# Patient Record
Sex: Female | Born: 1965 | Race: Black or African American | Hispanic: No | Smoking: Current every day smoker
Health system: Southern US, Community
[De-identification: ages and names within clinical notes are randomized; demographics above are authoritative.]

## PROBLEM LIST (undated history)

## (undated) ENCOUNTER — Emergency Department (HOSPITAL_COMMUNITY): Admission: EM | Payer: Medicaid Other | Source: Home / Self Care

## (undated) DIAGNOSIS — K219 Gastro-esophageal reflux disease without esophagitis: Secondary | ICD-10-CM

## (undated) DIAGNOSIS — F419 Anxiety disorder, unspecified: Secondary | ICD-10-CM

## (undated) DIAGNOSIS — G4730 Sleep apnea, unspecified: Secondary | ICD-10-CM

## (undated) DIAGNOSIS — R7303 Prediabetes: Secondary | ICD-10-CM

## (undated) DIAGNOSIS — M1990 Unspecified osteoarthritis, unspecified site: Secondary | ICD-10-CM

## (undated) DIAGNOSIS — I471 Supraventricular tachycardia, unspecified: Secondary | ICD-10-CM

## (undated) DIAGNOSIS — I10 Essential (primary) hypertension: Secondary | ICD-10-CM

## (undated) DIAGNOSIS — F329 Major depressive disorder, single episode, unspecified: Secondary | ICD-10-CM

## (undated) DIAGNOSIS — F32A Depression, unspecified: Secondary | ICD-10-CM

## (undated) HISTORY — DX: Gastro-esophageal reflux disease without esophagitis: K21.9

## (undated) HISTORY — DX: Essential (primary) hypertension: I10

## (undated) HISTORY — PX: HAND SURGERY: SHX662

## (undated) HISTORY — DX: Prediabetes: R73.03

## (undated) HISTORY — PX: COLONOSCOPY: SHX174

## (undated) HISTORY — PX: BACK SURGERY: SHX140

## (undated) HISTORY — DX: Sleep apnea, unspecified: G47.30

## (undated) HISTORY — DX: Anxiety disorder, unspecified: F41.9

## (undated) HISTORY — DX: Unspecified osteoarthritis, unspecified site: M19.90

## (undated) HISTORY — PX: ABDOMINAL HYSTERECTOMY: SHX81

## (undated) HISTORY — PX: TOTAL ABDOMINAL HYSTERECTOMY: SHX209

---

## 2000-10-21 HISTORY — PX: NECK SURGERY: SHX720

## 2016-06-16 ENCOUNTER — Emergency Department (HOSPITAL_COMMUNITY)
Admission: EM | Admit: 2016-06-16 | Discharge: 2016-06-16 | Disposition: A | Discharge status: Home / Self Care | Payer: Self-pay | Attending: Emergency Medicine | Admitting: Emergency Medicine

## 2016-06-16 ENCOUNTER — Encounter (HOSPITAL_COMMUNITY): Payer: Self-pay | Admitting: Emergency Medicine

## 2016-06-16 ENCOUNTER — Emergency Department (HOSPITAL_COMMUNITY): Payer: Self-pay

## 2016-06-16 DIAGNOSIS — R0789 Other chest pain: Secondary | ICD-10-CM | POA: Insufficient documentation

## 2016-06-16 DIAGNOSIS — F17210 Nicotine dependence, cigarettes, uncomplicated: Secondary | ICD-10-CM | POA: Insufficient documentation

## 2016-06-16 HISTORY — DX: Major depressive disorder, single episode, unspecified: F32.9

## 2016-06-16 HISTORY — DX: Depression, unspecified: F32.A

## 2016-06-16 LAB — BASIC METABOLIC PANEL
ANION GAP: 7 (ref 5–15)
BUN: 7 mg/dL (ref 6–20)
CHLORIDE: 107 mmol/L (ref 101–111)
CO2: 24 mmol/L (ref 22–32)
CREATININE: 0.67 mg/dL (ref 0.44–1.00)
Calcium: 9.6 mg/dL (ref 8.9–10.3)
GFR calc non Af Amer: >60 mL/min (ref >60)
Glucose, Bld: 96 mg/dL (ref 65–99)
POTASSIUM: 3.8 mmol/L (ref 3.5–5.1)
SODIUM: 138 mmol/L (ref 135–145)

## 2016-06-16 LAB — CBC
HEMATOCRIT: 44.1 % (ref 36.0–46.0)
HEMOGLOBIN: 15 g/dL (ref 12.0–15.0)
MCH: 30.7 pg (ref 26.0–34.0)
MCHC: 34 g/dL (ref 30.0–36.0)
MCV: 90.2 fL (ref 78.0–100.0)
PLATELETS: 278 10*3/uL (ref 150–400)
RBC: 4.89 MIL/uL (ref 3.87–5.11)
RDW: 13.1 % (ref 11.5–15.5)
WBC: 7.6 10*3/uL (ref 4.0–10.5)

## 2016-06-16 LAB — I-STAT TROPONIN, ED
TROPONIN I, POC: 0 ng/mL (ref 0.00–0.08)
Troponin i, poc: 0.01 ng/mL (ref 0.00–0.08)

## 2016-06-16 NOTE — ED Triage Notes (Signed)
Pt states for the last 3 days she has had central chest tightness with palpitations that have been intermittent. Pt states worse when she wakes up if she has to take her CPAP off at night. Pt states at this time just feels like "she has a chest cold".

## 2016-06-16 NOTE — Discharge Instructions (Signed)
You were seen in the ER today for evaluation of chest pain. Your labs were normal. Remember to use your CPAP machine as directed. Please follow up with your primary care provider as soon as possible. If you need a new one please call the phone number listed in this packet for assistance in establishing primary care.

## 2016-06-16 NOTE — ED Provider Notes (Signed)
[facility] DEPT Provider Note   CSN: [id number] Arrival date & time: 06/16/16  1443  History   Chief Complaint Chief Complaint  Patient presents with  . Palpitations  . Chest Pain   HPI  Lauren [name] is an 50 y.o. female with history of OSA who presents to the ED for evaluation of constant chest tightness and palpitations for the past three days. She states she thinks it is because she does not use her CPAP as she is supposed to. She states she has sleep apnea but will frequently take her CPAP off in the middle of the night because it is uncomfortable. She states she notices her chest tightness is the worst in the morning after she has slept without her CPAP. Denies cough or congestion. Denies shortness of breath. Denies diaphoresis. Denies sharp or pleuritic chest pain. Her tightness is described as "tight then thumping while I try to take a deep breath." it is not worse with exertion. Using her CPAP helps her feel better. Denies leg pain or swelling. Denies recent travel. Denies significant family cardiac history. She does smoke 1/2 PPD.  Past Medical History:  Diagnosis Date  . Depression     There are no active problems to display for this patient.   Past Surgical History:  Procedure Laterality Date  . ABDOMINAL HYSTERECTOMY    . BACK SURGERY    . CESAREAN SECTION    . HAND SURGERY      OB History    No data available       Home Medications    Prior to Admission medications   Medication Sig Start Date End Date Taking? Authorizing Provider  venlafaxine XR (EFFEXOR-XR) 150 MG 24 hr capsule Take 150 mg by mouth every morning. 05/13/16  Yes Historical Provider, MD    Family History No family history on file.  Social History Social History  Substance Use Topics  . Smoking status: Current Every Day Smoker    Packs/day: 0.50    Types: Cigarettes  . Smokeless tobacco: Never Used  . Alcohol use No     Allergies   Review of patient's allergies indicates no  known allergies.   Review of Systems Review of Systems 10 Systems reviewed and are negative for acute change except as noted in the HPI.   Physical Exam Updated Vital Signs BP 119/87 (BP Location: Right Arm)   Pulse 61   Temp 98.7 F (37.1 C) (Oral)   Resp 16   Ht 5\' 5"  (1.651 m)   Wt 68 kg   SpO2 100%   BMI 24.96 kg/m   Physical Exam  Constitutional: She is oriented to person, place, and time.  HENT:  Right Ear: External ear normal.  Left Ear: External ear normal.  Nose: Nose normal.  Mouth/Throat: Oropharynx is clear and moist. No oropharyngeal exudate.  Eyes: Conjunctivae are normal.  Neck: Neck supple.  Cardiovascular: Normal rate, regular rhythm, normal heart sounds and intact distal pulses.   Pulmonary/Chest: Effort normal and breath sounds normal. No respiratory distress. She has no wheezes.  Abdominal: Soft. Bowel sounds are normal. She exhibits no distension. There is no tenderness. There is no rebound and no guarding.  Musculoskeletal: She exhibits no edema.  Lymphadenopathy:    She has no cervical adenopathy.  Neurological: She is alert and oriented to person, place, and time. No cranial nerve deficit.  Skin: Skin is warm and dry.  Psychiatric: She has a normal mood and affect.  Nursing note and vitals reviewed.  ED Treatments / Results  Labs (all labs ordered are listed, but only abnormal results are displayed) Labs Reviewed  BASIC METABOLIC PANEL  CBC  I-STAT [place], ED  I-STAT [place], ED    EKG  EKG Interpretation  Date/Time:  Sunday June 16 2016 14:52:50 EDT Ventricular Rate:  66 PR Interval:  142 QRS Duration: 80 QT Interval:  408 QTC Calculation: 427 R Axis:   52 Text Interpretation:  Normal sinus rhythm with sinus arrhythmia Normal ECG No previous tracing Confirmed by KNOTT MD, DANIEL [phone]) on 06/17/2016 11:01:00 AM       Radiology Dg Chest 2 View  Result Date: 06/16/2016 CLINICAL DATA:  Short of breath and nausea  EXAM: CHEST  2 VIEW COMPARISON:  None. FINDINGS: Anterior cervical fusion. Normal mediastinum and cardiac silhouette. Normal pulmonary vasculature. No evidence of effusion, infiltrate, or pneumothorax. No acute bony abnormality. IMPRESSION: No acute cardiopulmonary process. Electronically Signed   By: Suzy Bouchard M.D.   On: 06/16/2016 15:39    Procedures Procedures (including critical care time)  Medications Ordered in ED Medications - No data to display   Initial Impression / Assessment and Plan / ED Course  I have reviewed the triage vital signs and the nursing notes.  Pertinent labs & imaging results that were available during my care of the patient were reviewed by me and considered in my medical decision making (see chart for details).  Clinical Course   Wells 0. Had ordered d-dimer but can't find tube. Pt doesn't want to wait for redraw. Understands risks of leaving without complete evaluation however I feel is reasonable due to low suspicion for PE. Doubt ACS. Delta troponin 0. Encouraged using CPAP as prescribed. Encouraged close PCP f/u. ER return precautions given.   Final Clinical Impressions(s) / ED Diagnoses   Final diagnoses:  Chest tightness    New Prescriptions Discharge Medication List as of 06/16/2016  7:29 PM       Anne Ng, PA-C 06/17/16 1439    Dorie Rank, MD 06/18/16 1106

## 2016-09-22 ENCOUNTER — Emergency Department (HOSPITAL_COMMUNITY)
Admission: EM | Admit: 2016-09-22 | Discharge: 2016-09-22 | Disposition: A | Discharge status: Home / Self Care | Payer: Self-pay | Attending: Emergency Medicine | Admitting: Emergency Medicine

## 2016-09-22 ENCOUNTER — Emergency Department (HOSPITAL_COMMUNITY): Payer: Self-pay

## 2016-09-22 ENCOUNTER — Encounter (HOSPITAL_COMMUNITY): Payer: Self-pay

## 2016-09-22 DIAGNOSIS — R221 Localized swelling, mass and lump, neck: Secondary | ICD-10-CM

## 2016-09-22 DIAGNOSIS — R55 Syncope and collapse: Secondary | ICD-10-CM | POA: Insufficient documentation

## 2016-09-22 DIAGNOSIS — F17210 Nicotine dependence, cigarettes, uncomplicated: Secondary | ICD-10-CM | POA: Insufficient documentation

## 2016-09-22 DIAGNOSIS — M542 Cervicalgia: Secondary | ICD-10-CM | POA: Insufficient documentation

## 2016-09-22 DIAGNOSIS — R0602 Shortness of breath: Secondary | ICD-10-CM | POA: Insufficient documentation

## 2016-09-22 LAB — COMPREHENSIVE METABOLIC PANEL
ALBUMIN: 3.7 g/dL (ref 3.5–5.0)
ALT: 13 U/L — AB (ref 14–54)
AST: 24 U/L (ref 15–41)
Alkaline Phosphatase: 79 U/L (ref 38–126)
Anion gap: 9 (ref 5–15)
BILIRUBIN TOTAL: 0.6 mg/dL (ref 0.3–1.2)
BUN: 8 mg/dL (ref 6–20)
CHLORIDE: 107 mmol/L (ref 101–111)
CO2: 23 mmol/L (ref 22–32)
CREATININE: 0.7 mg/dL (ref 0.44–1.00)
Calcium: 9.5 mg/dL (ref 8.9–10.3)
GFR calc Af Amer: >60 mL/min (ref >60)
GLUCOSE: 92 mg/dL (ref 65–99)
POTASSIUM: 4.5 mmol/L (ref 3.5–5.1)
Sodium: 139 mmol/L (ref 135–145)
TOTAL PROTEIN: 6.7 g/dL (ref 6.5–8.1)

## 2016-09-22 LAB — I-STAT CHEM 8, ED
BUN: 9 mg/dL (ref 6–20)
CALCIUM ION: 1.11 mmol/L — AB (ref 1.15–1.40)
CHLORIDE: 106 mmol/L (ref 101–111)
CREATININE: 0.7 mg/dL (ref 0.44–1.00)
GLUCOSE: 97 mg/dL (ref 65–99)
HCT: 45 % (ref 36.0–46.0)
Hemoglobin: 15.3 g/dL — ABNORMAL HIGH (ref 12.0–15.0)
POTASSIUM: 4.2 mmol/L (ref 3.5–5.1)
Sodium: 140 mmol/L (ref 135–145)
TCO2: 24 mmol/L (ref 0–100)

## 2016-09-22 LAB — CBC WITH DIFFERENTIAL/PLATELET
Basophils Absolute: 0 10*3/uL (ref 0.0–0.1)
Basophils Relative: 0 %
EOS PCT: 2 %
Eosinophils Absolute: 0.1 10*3/uL (ref 0.0–0.7)
HEMATOCRIT: 42.9 % (ref 36.0–46.0)
Hemoglobin: 14.6 g/dL (ref 12.0–15.0)
LYMPHS PCT: 42 %
Lymphs Abs: 2.9 10*3/uL (ref 0.7–4.0)
MCH: 30.4 pg (ref 26.0–34.0)
MCHC: 34 g/dL (ref 30.0–36.0)
MCV: 89.4 fL (ref 78.0–100.0)
MONO ABS: 0.3 10*3/uL (ref 0.1–1.0)
MONOS PCT: 4 %
NEUTROS ABS: 3.6 10*3/uL (ref 1.7–7.7)
Neutrophils Relative %: 53 %
PLATELETS: ADEQUATE 10*3/uL (ref 150–400)
RBC: 4.8 MIL/uL (ref 3.87–5.11)
RDW: 13.4 % (ref 11.5–15.5)
WBC: 6.9 10*3/uL (ref 4.0–10.5)

## 2016-09-22 LAB — I-STAT TROPONIN, ED: Troponin i, poc: 0 ng/mL (ref 0.00–0.08)

## 2016-09-22 LAB — D-DIMER, QUANTITATIVE: D-Dimer, Quant: 0.52 ug/mL-FEU — ABNORMAL HIGH (ref 0.00–0.50)

## 2016-09-22 LAB — LIPASE, BLOOD: LIPASE: 37 U/L (ref 11–51)

## 2016-09-22 MED ORDER — SODIUM CHLORIDE 0.9 % IV SOLN: INTRAVENOUS | Status: DC

## 2016-09-22 MED ORDER — DM-GUAIFENESIN ER 30-600 MG PO TB12: 1.0000 | ORAL_TABLET | Freq: Two times a day (BID) | ORAL | 0 refills | Status: DC

## 2016-09-22 MED ORDER — SODIUM CHLORIDE 0.9 % IV BOLUS (SEPSIS)
500.0000 mL | Freq: Once | INTRAVENOUS | Status: AC
  Administered 2016-09-22: 500 mL via INTRAVENOUS

## 2016-09-22 MED ORDER — IOPAMIDOL (ISOVUE-370) INJECTION 76%
INTRAVENOUS | Status: AC
  Administered 2016-09-22: 50 mL
  Filled 2016-09-22: qty 100

## 2016-09-22 NOTE — ED Notes (Signed)
Pt is in stable condition upon d/c and ambulates from ED. 

## 2016-09-22 NOTE — ED Notes (Signed)
DR. Thomasene Lot at bedside to start [place] IV.

## 2016-09-22 NOTE — ED Notes (Signed)
Attempted IV x3 with no success. Santiago Glad, RN to attempt.

## 2016-09-22 NOTE — ED Provider Notes (Signed)
17:30- asked to see patient. Post CT imaging, and discharged home if stable. CT imaging is return, and is reassuring. No sign of pulmonary embolus, or cervical pathology.  This time, the patient is comfortable, has no further complaints. I described the findings to her and answered all questions.  Nursing Notes Reviewed/ Care Coordinated Applicable Imaging Reviewed Interpretation of Laboratory Data incorporated into ED treatment  The patient appears reasonably screened and/or stabilized for discharge and I doubt any other medical condition or other [facility] requiring further screening, evaluation, or treatment in the ED at this time prior to discharge.  Plan: Home Medications- continue usual, RX Mucinex; Home Treatments- rest, fluids; return here if the recommended treatment, does not improve the symptoms; Recommended follow up- PCP prn    Daleen Bo, MD 09/22/16 1734

## 2016-09-22 NOTE — ED Notes (Signed)
IV team at bedside 

## 2016-09-22 NOTE — ED Triage Notes (Signed)
Patient states that she awoke with "shortness of breath". On further assessment she complains of a pain with swallowing. States that her neck is tender to touch, no cough, no distress. Also complains of dizziness today with same. No neuro deficits, no CP. EKG done on arrival. Denies cold/cough

## 2016-09-22 NOTE — ED Provider Notes (Signed)
[facility] DEPT Provider Note   CSN: [id number] Arrival date & time: 09/22/16  0932     History   Chief Complaint Chief Complaint  Patient presents with  . Shortness of Breath  . Dizziness    HPI Lauren [name] is a 50 y.o. female.  Patient awoke with some shortness of breath went on to work. At work at almost a near syncopal episode. Patient having exertional shortness of breath. Patient was seen and pain with swallowing does not feel like a sore throat is a fullness in the anterior part of her neck. Patient did state that at work she got lightheaded dizzy thought she was going to pass out. No chest pain no abdominal pain no nausea vomiting or diarrhea. No fevers.    No true room spinning.  Past Medical History:  Diagnosis Date  . Depression     There are no active problems to display for this patient.   Past Surgical History:  Procedure Laterality Date  . ABDOMINAL HYSTERECTOMY    . BACK SURGERY    . CESAREAN SECTION    . HAND SURGERY      OB History    No data available       Home Medications    Prior to Admission medications   Medication Sig Start Date End Date Taking? Authorizing Provider  venlafaxine XR (EFFEXOR-XR) 150 MG 24 hr capsule Take 150 mg by mouth every morning. 05/13/16  Yes Historical Provider, MD  dextromethorphan-guaiFENesin (MUCINEX DM) 30-600 MG 12hr tablet Take 1 tablet by mouth 2 (two) times daily. 09/22/16   Fredia Sorrow, MD    Family History No family history on file.  Social History Social History  Substance Use Topics  . Smoking status: Current Every Day Smoker    Packs/day: 0.50    Types: Cigarettes  . Smokeless tobacco: Never Used  . Alcohol use No     Allergies   Patient has no known allergies.   Review of Systems Review of Systems  Constitutional: Positive for fever.  HENT: Positive for sore throat and trouble swallowing. Negative for drooling.   Eyes: Negative for visual disturbance.  Respiratory:  Positive for cough and shortness of breath.   Cardiovascular: Negative for chest pain.  Gastrointestinal: Negative for abdominal pain, diarrhea, nausea and vomiting.  Musculoskeletal: Negative for back pain and neck stiffness.  Skin: Negative for rash.  Neurological: Positive for dizziness and light-headedness. Negative for seizures and headaches.  Hematological: Does not bruise/bleed easily.  Psychiatric/Behavioral: Negative for confusion.     Physical Exam Updated Vital Signs BP 129/65   Pulse 67   Temp 98.2 F (36.8 C) (Oral)   Resp 19   Ht 5\' 2"  (1.575 m)   Wt 68 kg   SpO2 100%   BMI 27.44 kg/m   Physical Exam  Constitutional: She is oriented to person, place, and time. She appears well-developed and well-nourished. She appears distressed.  HENT:  Head: Normocephalic and atraumatic.  Mouth/Throat: Oropharynx is clear and moist.  Eyes: EOM are normal. Pupils are equal, round, and reactive to light.  Neck: Normal range of motion. Neck supple. No tracheal deviation present.  Cardiovascular: Normal rate, regular rhythm and normal heart sounds.   Pulmonary/Chest: Effort normal and breath sounds normal. No stridor. No respiratory distress. She has no wheezes. She has no rales.  Abdominal: Soft. Bowel sounds are normal. There is no tenderness.  Lymphadenopathy:    She has no cervical adenopathy.  Neurological: She is alert and oriented  to person, place, and time. No cranial nerve deficit or sensory deficit. She exhibits normal muscle tone. Coordination normal.  Skin: Skin is warm.  Nursing note and vitals reviewed.    ED Treatments / Results  Labs (all labs ordered are listed, but only abnormal results are displayed) Labs Reviewed  COMPREHENSIVE METABOLIC PANEL - Abnormal; Notable for the following:       Result Value   ALT 13 (*)    All other components within normal limits  D-DIMER, QUANTITATIVE (NOT AT [facility]) - Abnormal; Notable for the following:    D-Dimer, Quant  0.52 (*)    All other components within normal limits  I-STAT CHEM 8, ED - Abnormal; Notable for the following:    Calcium, Ion 1.11 (*)    Hemoglobin 15.3 (*)    All other components within normal limits  LIPASE, BLOOD  CBC WITH DIFFERENTIAL/PLATELET  Randolm Idol, ED    EKG  EKG Interpretation  Date/Time:  Sunday September 22 2016 09:38:38 EST Ventricular Rate:  89 PR Interval:  152 QRS Duration: 82 QT Interval:  356 QTC Calculation: 433 R Axis:   74 Text Interpretation:  Normal sinus rhythm Normal ECG Confirmed by Charlayne Vultaggio  MD, [place] ([id number]) on 09/22/2016 11:46:33 AM       Radiology Dg Chest 2 View  Result Date: 09/22/2016 CLINICAL DATA:  Shortness of breath. Midsternal chest tightness today. Productive cough for the past week. Smoker. EXAM: CHEST  2 VIEW COMPARISON:  06/16/2016. FINDINGS: Normal sized heart. Clear lungs. Stable mild prominence of the interstitial markings. Cervical spine fixation hardware. IMPRESSION: No acute abnormality. Stable mild chronic interstitial lung disease compatible with the history of smoking. Electronically Signed   By: Claudie Revering M.D.   On: 09/22/2016 13:28    Procedures Procedures (including critical care time)  Medications Ordered in ED Medications  0.9 %  sodium chloride infusion (not administered)  iopamidol (ISOVUE-370) 76 % injection (not administered)  sodium chloride 0.9 % bolus 500 mL (500 mLs Intravenous New Bag/Given 09/22/16 1619)     Initial Impression / Assessment and Plan / ED Course  I have reviewed the triage vital signs and the nursing notes.  Pertinent labs & imaging results that were available during my care of the patient were reviewed by me and considered in my medical decision making (see chart for details).  Clinical Course     Patient with near syncopal episode today at work. Had a fullness in her throat. Feels like mucus but not really coughing up much mucus. Had somewhat of a productive cough on and  off for about 3 weeks. Chest x-ray negative. D-dimer was elevated so we'll get CT angios chest and will do CT soft tissue neck to evaluate this fullness in the neck. All symptoms could be just related to an upper respiratory bronchitis. Patient however did have near syncope has had the exertional shortness of breath. No leg swelling. If CTs are negative patient can be discharged home and treated like a bronchitis. Work note provided. Patient will be turned over to the evening ED physician Dr. Eulis Foster.  Final Clinical Impressions(s) / ED Diagnoses   Final diagnoses:  Near syncope  Shortness of breath  Neck fullness    New Prescriptions New Prescriptions   DEXTROMETHORPHAN-GUAIFENESIN (MUCINEX DM) 30-600 MG 12HR TABLET    Take 1 tablet by mouth 2 (two) times daily.     Fredia Sorrow, MD 09/22/16 [phone]

## 2016-09-22 NOTE — ED Notes (Signed)
IV attempted in right forearm-- successful stick, infiltrated when flushing.

## 2016-09-22 NOTE — Discharge Instructions (Signed)
Recommend a trial of Mucinex DM. Return for any new or worse symptoms. Work note provided.

## 2016-10-04 ENCOUNTER — Ambulatory Visit: Payer: Self-pay | Attending: Internal Medicine | Admitting: Physician Assistant

## 2016-10-04 VITALS — BP 128/82 | HR 83 | Temp 98.3°F | Resp 16 | Wt 158.0 lb

## 2016-10-04 DIAGNOSIS — Z5189 Encounter for other specified aftercare: Secondary | ICD-10-CM | POA: Insufficient documentation

## 2016-10-04 DIAGNOSIS — R350 Frequency of micturition: Secondary | ICD-10-CM | POA: Insufficient documentation

## 2016-10-04 DIAGNOSIS — J209 Acute bronchitis, unspecified: Secondary | ICD-10-CM | POA: Insufficient documentation

## 2016-10-04 DIAGNOSIS — F3289 Other specified depressive episodes: Secondary | ICD-10-CM

## 2016-10-04 DIAGNOSIS — F329 Major depressive disorder, single episode, unspecified: Secondary | ICD-10-CM | POA: Insufficient documentation

## 2016-10-04 LAB — POCT URINALYSIS DIPSTICK
BILIRUBIN UA: NEGATIVE
GLUCOSE UA: NEGATIVE
Ketones, UA: NEGATIVE
Leukocytes, UA: NEGATIVE
Nitrite, UA: NEGATIVE
PH UA: 6.5
Protein, UA: NEGATIVE
RBC UA: NEGATIVE
SPEC GRAV UA: 1.01
UROBILINOGEN UA: 0.2

## 2016-10-04 MED ORDER — AZITHROMYCIN 250 MG PO TABS: ORAL_TABLET | ORAL | 0 refills | Status: DC

## 2016-10-04 MED ORDER — FLUCONAZOLE 150 MG PO TABS: 150.0000 mg | ORAL_TABLET | Freq: Once | ORAL | 0 refills | Status: AC

## 2016-10-04 MED ORDER — VENLAFAXINE HCL ER 150 MG PO CP24: 150.0000 mg | ORAL_CAPSULE | ORAL | 3 refills | Status: DC

## 2016-10-04 MED FILL — VENLAFAXINE HCL ER 150 MG C: 150 | 30 days supply | Qty: 30 | Fill #0

## 2016-10-04 MED FILL — FLUCONAZOLE 150 MG TABLET: 150 | 1 days supply | Qty: 1 | Fill #0

## 2016-10-04 MED FILL — ?AZITHROMYCIN 250 MG TABLET: 250 | 5 days supply | Qty: 6 | Fill #0

## 2016-10-04 NOTE — Progress Notes (Signed)
Patient ID: Lauren [name], female   DOB: 1966-08-26, 50 y.o.   MRN: [id number]   Lauren [name], is a 50 y.o. female  [id number]  [id number]  DOB - [birth year]  Subjective:  Chief Complaint and HPI: Lauren [name] is a 50 y.o. female here today to establish care and for a follow up visit after being seen in the ED 09/22/2016 for near syncopal episode, congestion, and SOB.  Cardiac enzymes, D-Dimer, and other labs overall unremarkable. CT angio, CT soft tissue and neck, and CXR all unremarkable.  She was prescribed cough medication and told to follow up here.   She continues to have a cough now for 3 weeks that is productive of yellow mucus.  No SOB now.  Now CP.  She c/o urinary frequency without dysuria.  +smoker.  No f/c.  She moved here from [place] several months ago.  She has been on Effexor for about 18 years and is stable on the medication.  She denies any SI/HI.  She previously saw a counselor and would like to establish that here in [place] as well once she gets financial assistance/orange card/ or other insurance.  There are no safety issues today.  ED/Hospital notes reviewed.     ROS:   Constitutional:  No f/c, No night sweats, No unexplained weight loss. EENT:  No vision changes, No blurry vision, No hearing changes. No mouth, throat, or ear problems.  Respiratory: + cough, No SOB Cardiac: No CP, no palpitations GI:  No abd pain, No N/V/D. GU: +urinary frequency without dysuria Musculoskeletal: No joint pain Neuro: No headache, no dizziness, no motor weakness.  Skin: No rash Endocrine:  No polydipsia. No polyuria.  Psych: Denies SI/HI  No problems updated.  ALLERGIES: No Known Allergies  PAST MEDICAL HISTORY: Past Medical History:  Diagnosis Date  . Depression     MEDICATIONS AT HOME: Prior to Admission medications   Medication Sig Start Date End Date Taking? Authorizing Provider  venlafaxine XR (EFFEXOR-XR) 150 MG 24 hr capsule Take 1 capsule (150 mg total) by  mouth every morning. 10/04/16  Yes Argentina Donovan, PA-C  azithromycin (ZITHROMAX) 250 MG tablet Take 2 today then 1 daily 10/04/16   Argentina Donovan, PA-C  fluconazole (DIFLUCAN) 150 MG tablet Take 1 tablet (150 mg total) by mouth once. If needed for yeast infection 10/04/16 10/04/16  Argentina Donovan, PA-C     Objective:  EXAM:   Vitals:   10/04/16 1525  BP: 128/82  Pulse: 83  Resp: 16  Temp: 98.3 F (36.8 C)  TempSrc: Oral  SpO2: 97%  Weight: 158 lb (71.7 kg)    General appearance : A&OX3. NAD. Non-toxic-appearing, very pleasant and cooperative patient HEENT: Atraumatic and Normocephalic.  PERRLA. EOM intact.  TM clear B. Mouth-MMM, post pharynx WNL w/o erythema, No PND. Neck: supple, no JVD. No cervical lymphadenopathy. No thyromegaly Chest/Lungs:  Breathing-non-labored, Good air entry bilaterally, breath sounds normal without rales or wheezing.  There are occasional minimal rhonchi CVS: S1 S2 regular, no murmurs, gallops, rubs  Extremities: Bilateral Lower Ext shows no edema, both legs are warm to touch with = pulse throughout Neurology:  CN II-XII grossly intact, Non focal.   Psych:  TP linear. J/I WNL. Normal speech. Appropriate eye contact and affect.  Skin:  No Rash  Data Review No results found for: HGBA1C   Assessment & Plan   1. Frequency of urination UA is normal - POCT urinalysis dipstick  2. Other depression Stable-will set up counseling  once insurance or financial assistance is obtained - venlafaxine XR (EFFEXOR-XR) 150 MG 24 hr capsule; Take 1 capsule (150 mg total) by mouth every morning.  Dispense: 30 capsule; Refill: 3  3. Acute bronchitis, unspecified organism Will cover for atypicals due to length of illness - azithromycin (ZITHROMAX) 250 MG tablet; Take 2 today then 1 daily  Dispense: 6 tablet; Refill: 0 - fluconazole (DIFLUCAN) 150 MG tablet; Take 1 tablet (150 mg total) by mouth once. If needed for yeast infection  Dispense: 1 tablet;  Refill: 0 Smoking cessation advised  Patient have been counseled extensively about nutrition and exercise  Return in about 6 weeks (around 11/15/2016) for establish with PCP; recheck bronchitis and depression.  The patient was given clear instructions to go to ER or return to medical center if symptoms don't improve, worsen or new problems develop. The patient verbalized understanding. The patient was told to call to get lab results if they haven't heard anything in the next week.     Freeman Caldron, PA-C [facility] [place], [place]   10/04/2016, 4:03 PM

## 2016-11-11 MED FILL — VENLAFAXINE HCL ER 150 MG C: 150 | 30 days supply | Qty: 30 | Fill #1

## 2016-11-12 ENCOUNTER — Encounter (HOSPITAL_COMMUNITY): Payer: Self-pay | Admitting: Emergency Medicine

## 2016-11-12 ENCOUNTER — Emergency Department (HOSPITAL_COMMUNITY)
Admission: EM | Admit: 2016-11-12 | Discharge: 2016-11-13 | Disposition: A | Discharge status: Home / Self Care | Payer: Self-pay | Attending: Emergency Medicine | Admitting: Emergency Medicine

## 2016-11-12 DIAGNOSIS — R1032 Left lower quadrant pain: Secondary | ICD-10-CM | POA: Insufficient documentation

## 2016-11-12 DIAGNOSIS — Z5321 Procedure and treatment not carried out due to patient leaving prior to being seen by health care provider: Secondary | ICD-10-CM | POA: Insufficient documentation

## 2016-11-12 DIAGNOSIS — F17210 Nicotine dependence, cigarettes, uncomplicated: Secondary | ICD-10-CM | POA: Insufficient documentation

## 2016-11-12 LAB — COMPREHENSIVE METABOLIC PANEL
ALBUMIN: 4.1 g/dL (ref 3.5–5.0)
ALT: 17 U/L (ref 14–54)
ANION GAP: 8 (ref 5–15)
AST: 20 U/L (ref 15–41)
Alkaline Phosphatase: 85 U/L (ref 38–126)
BILIRUBIN TOTAL: 0.4 mg/dL (ref 0.3–1.2)
BUN: 7 mg/dL (ref 6–20)
CHLORIDE: 105 mmol/L (ref 101–111)
CO2: 26 mmol/L (ref 22–32)
Calcium: 9.8 mg/dL (ref 8.9–10.3)
Creatinine, Ser: 0.72 mg/dL (ref 0.44–1.00)
GFR calc Af Amer: >60 mL/min (ref >60)
GFR calc non Af Amer: >60 mL/min (ref >60)
GLUCOSE: 102 mg/dL — AB (ref 65–99)
POTASSIUM: 3.9 mmol/L (ref 3.5–5.1)
SODIUM: 139 mmol/L (ref 135–145)
TOTAL PROTEIN: 7.3 g/dL (ref 6.5–8.1)

## 2016-11-12 LAB — CBC
HEMATOCRIT: 45.4 % (ref 36.0–46.0)
Hemoglobin: 15.2 g/dL — ABNORMAL HIGH (ref 12.0–15.0)
MCH: 29.9 pg (ref 26.0–34.0)
MCHC: 33.5 g/dL (ref 30.0–36.0)
MCV: 89.2 fL (ref 78.0–100.0)
Platelets: 306 10*3/uL (ref 150–400)
RBC: 5.09 MIL/uL (ref 3.87–5.11)
RDW: 13.3 % (ref 11.5–15.5)
WBC: 6 10*3/uL (ref 4.0–10.5)

## 2016-11-12 LAB — LIPASE, BLOOD: LIPASE: 35 U/L (ref 11–51)

## 2016-11-12 MED ORDER — ONDANSETRON HCL 4 MG/2ML IJ SOLN
4.0000 mg | Freq: Once | INTRAMUSCULAR | Status: DC
  Filled 2016-11-12: qty 2

## 2016-11-12 MED ORDER — PANTOPRAZOLE SODIUM 40 MG IV SOLR
40.0000 mg | Freq: Once | INTRAVENOUS | Status: DC
  Filled 2016-11-12: qty 40

## 2016-11-12 MED ORDER — KETOROLAC TROMETHAMINE 30 MG/ML IJ SOLN
30.0000 mg | Freq: Once | INTRAMUSCULAR | Status: DC
  Filled 2016-11-12: qty 1

## 2016-11-12 MED ORDER — SODIUM CHLORIDE 0.9 % IV BOLUS (SEPSIS): 2000.0000 mL | Freq: Once | INTRAVENOUS | Status: DC

## 2016-11-12 NOTE — ED Triage Notes (Signed)
Left lower abd pain x 3-4 weeks , denies n/v/sob  Denies dysuria vag d/c , last bm this am good, having heartburn

## 2016-11-21 ENCOUNTER — Ambulatory Visit: Payer: Self-pay | Admitting: Family Medicine

## 2016-11-28 ENCOUNTER — Encounter: Payer: Self-pay | Admitting: Family Medicine

## 2016-11-28 ENCOUNTER — Ambulatory Visit: Payer: Self-pay | Attending: Family Medicine | Admitting: Family Medicine

## 2016-11-28 VITALS — BP 129/76 | HR 101 | Temp 98.0°F | Resp 18 | Ht 62.0 in | Wt 160.4 lb

## 2016-11-28 DIAGNOSIS — F329 Major depressive disorder, single episode, unspecified: Secondary | ICD-10-CM | POA: Insufficient documentation

## 2016-11-28 DIAGNOSIS — K219 Gastro-esophageal reflux disease without esophagitis: Secondary | ICD-10-CM

## 2016-11-28 DIAGNOSIS — R0981 Nasal congestion: Secondary | ICD-10-CM

## 2016-11-28 DIAGNOSIS — F32A Depression, unspecified: Secondary | ICD-10-CM | POA: Insufficient documentation

## 2016-11-28 DIAGNOSIS — R0602 Shortness of breath: Secondary | ICD-10-CM | POA: Insufficient documentation

## 2016-11-28 DIAGNOSIS — F339 Major depressive disorder, recurrent, unspecified: Secondary | ICD-10-CM

## 2016-11-28 DIAGNOSIS — Z0001 Encounter for general adult medical examination with abnormal findings: Secondary | ICD-10-CM | POA: Insufficient documentation

## 2016-11-28 DIAGNOSIS — Z79899 Other long term (current) drug therapy: Secondary | ICD-10-CM | POA: Insufficient documentation

## 2016-11-28 DIAGNOSIS — R0989 Other specified symptoms and signs involving the circulatory and respiratory systems: Secondary | ICD-10-CM

## 2016-11-28 MED ORDER — RANITIDINE HCL 75 MG PO TABS: 75.0000 mg | ORAL_TABLET | Freq: Two times a day (BID) | ORAL | Status: DC

## 2016-11-28 MED ORDER — GUAIFENESIN ER 600 MG PO TB12: 1200.0000 mg | ORAL_TABLET | Freq: Two times a day (BID) | ORAL | Status: DC

## 2016-11-28 MED ORDER — FLUTICASONE PROPIONATE 50 MCG/ACT NA SUSP: 2.0000 | Freq: Every day | NASAL | 0 refills | Status: DC

## 2016-11-28 NOTE — Patient Instructions (Signed)
Food Choices for Gastroesophageal Reflux Disease, Adult When you have gastroesophageal reflux disease (GERD), the foods you eat and your eating habits are very important. Choosing the right foods can help ease your discomfort. What guidelines do I need to follow?  Choose fruits, vegetables, whole grains, and low-fat dairy products.  Choose low-fat meat, fish, and poultry.  Limit fats such as oils, salad dressings, butter, nuts, and avocado.  Keep a food diary. This helps you identify foods that cause symptoms.  Avoid foods that cause symptoms. These may be different for everyone.  Eat small meals often instead of 3 large meals a day.  Eat your meals slowly, in a place where you are relaxed.  Limit fried foods.  Cook foods using methods other than frying.  Avoid drinking alcohol.  Avoid drinking large amounts of liquids with your meals.  Avoid bending over or lying down until 2-3 hours after eating. What foods are not recommended? These are some foods and drinks that may make your symptoms worse: Vegetables  Tomatoes. Tomato juice. Tomato and spaghetti sauce. Chili peppers. Onion and garlic. Horseradish. Fruits  Oranges, grapefruit, and lemon (fruit and juice). Meats  High-fat meats, fish, and poultry. This includes hot dogs, ribs, ham, sausage, salami, and bacon. Dairy  Whole milk and chocolate milk. Sour cream. Cream. Butter. Ice cream. Cream cheese. Drinks  Coffee and tea. Bubbly (carbonated) drinks or energy drinks. Condiments  Hot sauce. Barbecue sauce. Sweets/Desserts  Chocolate and cocoa. Donuts. Peppermint and spearmint. Fats and Oils  High-fat foods. This includes [place] fries and potato chips. Other  Vinegar. Strong spices. This includes black pepper, white pepper, red pepper, cayenne, curry powder, cloves, ginger, and chili powder. The items listed above may not be a complete list of foods and drinks to avoid. Contact your dietitian for more information.    This information is not intended to replace advice given to you by your health care provider. Make sure you discuss any questions you have with your health care provider. Document Released: 04/07/2012 Document Revised: 03/14/2016 Document Reviewed: 08/11/2013 Elsevier Interactive Patient Education  2017 [employer].   Major Depressive Disorder, Adult Major depressive disorder (MDD) is a mental health condition. MDD often makes you feel sad, hopeless, or helpless. MDD can also cause symptoms in your body. MDD can affect your:  Work.  School.  Relationships.  Other normal activities. MDD can range from mild to very bad. It may occur once (single episode MDD). It can also occur many times (recurrent MDD). The main symptoms of MDD often include:  Feeling sad, depressed, or irritable most of the time.  Loss of interest. MDD symptoms also include:  Sleeping too much or too little.  Eating too much or too little.  A change in your weight.  Feeling tired (fatigue) or having low energy.  Feeling worthless.  Feeling guilty.  Trouble making decisions.  Trouble thinking clearly.  Thoughts of suicide or harming others.  Feeling weak.  Feeling agitated.  Keeping yourself from being around other people (isolation). Follow these instructions at home: Activity  Do these things as told by your doctor:  Go back to your normal activities.  Exercise regularly.  Spend time outdoors. Alcohol  Talk with your doctor about how alcohol can affect your antidepressant medicines.  Do not drink alcohol. Or, limit how much alcohol you drink.  This means no more than 1 drink a day for nonpregnant women and 2 drinks a day for men. One drink equals one of these:  12 oz of beer.  5 oz of wine.  1 oz of hard liquor. General instructions  Take over-the-counter and prescription medicines only as told by your doctor.  Eat a healthy diet.  Get plenty of sleep.  Find  activities that you enjoy. Make time to do them.  Think about joining a support group. Your doctor may be able to suggest a group for you.  Keep all follow-up visits as told by your doctor. This is important. Where to find more information:  [employer] on Mental Illness:  www.nami.org  U.S. National Institute of Mental Health:  https://carter.com/  National Suicide Prevention Lifeline:  (867) 844-1313. This is free, 24-hour help. Contact a doctor if:  Your symptoms get worse.  You have new symptoms. Get help right away if:  You self-harm.  You see, hear, taste, smell, or feel things that are not present (hallucinate). If you ever feel like you may hurt yourself or others, or have thoughts about taking your own life, get help right away. You can go to your nearest emergency department or call:  Your local emergency services (911 in the [place]  A suicide crisis helpline, such as the [place]:  (850) 570-1494. This is open 24 hours a day. This information is not intended to replace advice given to you by your health care provider. Make sure you discuss any questions you have with your health care provider. Document Released: 09/18/2015 Document Revised: 06/23/2016 Document Reviewed: 06/23/2016 Elsevier Interactive Patient Education  2017 [employer].

## 2016-11-28 NOTE — Progress Notes (Signed)
Patient is here for establish care  Patient is here for recheck bronchitis   Patient stated that her coughing is worse in the morning she thinks probably because sometimes she do and don't wear her CPAP mask  Patient declined the flu shot today  Patient complains pain on her left side of back hurt only in the am been off and on for a long term now   Patient has eaten today  Patient has taking her current meds today

## 2016-11-28 NOTE — Progress Notes (Signed)
Subjective:  Patient ID: Lauren [name], female    DOB: 1966-05-07  Age: 51 y.o. MRN: [id number]  CC: Establish Care   HPI Lauren [name] presents for complaint of large amount of mucous in the a.m. She denies any constitutional symptoms, shortness of breath, or history of allergies. She does report a history of sleep apnea and CPAP use. She reports being inconsistent with CPAP use. She is a current smoker. She also complains of heartburn. Reports use of over-the-counter antacids, Tums and Rolaids. She also reports history of depression with 2 suicidal attempts in the past she reports taking antidepressant medications for her symptoms. She reports taking Effexor since 2016. She denies any S/HI. Patient was offered meeting with LCSW for resources but declined.  Outpatient Medications Prior to Visit  Medication Sig Dispense Refill  . venlafaxine XR (EFFEXOR-XR) 150 MG 24 hr capsule Take 1 capsule (150 mg total) by mouth every morning. 30 capsule 3  . azithromycin (ZITHROMAX) 250 MG tablet Take 2 today then 1 daily (Patient not taking: Reported on 11/28/2016) 6 tablet 0   No facility-administered medications prior to visit.     ROS Review of Systems  Constitutional: Negative.   HENT: Positive for postnasal drip.   Eyes: Negative.   Respiratory: Negative.   Cardiovascular: Negative.   Gastrointestinal:       Heartburn.   Psychiatric/Behavioral: Positive for dysphoric mood (history of depression.).   Objective:  BP 129/76 (BP Location: Left Arm, Patient Position: Sitting, Cuff Size: Normal)   Pulse (!) 101   Temp 98 F (36.7 C) (Oral)   Resp 18   Ht 5\' 2"  (1.575 m)   Wt 160 lb 6.4 oz (72.8 kg)   SpO2 98%   BMI 29.34 kg/m   BP/Weight 11/28/2016 11/12/2016 [id number]  Systolic BP [id number]  Diastolic BP 76 96 82  Wt. (Lbs) 160.4 - 158  BMI 29.34 - 28.9     Physical Exam  Constitutional: She appears well-developed and well-nourished.  Eyes: Pupils are equal, round, and reactive  to light.  Cardiovascular: Normal rate, regular rhythm, normal heart sounds and intact distal pulses.   Pulmonary/Chest: Effort normal and breath sounds normal.  Abdominal: Soft. Bowel sounds are normal.  Psychiatric: She has a normal mood and affect. Her speech is normal. She expresses no suicidal plans and no homicidal plans.  Nursing note and vitals reviewed.   Assessment & Plan:   Problem List Items Addressed This Visit      Other   Depression       -Patient declined meeting with LCSW.     Other Visit Diagnoses    Gastroesophageal reflux disease, esophagitis presence not specified    -  Primary   Relevant Medications   ranitidine (ZANTAC 75) 75 MG tablet   Chest congestion       Relevant Medications   guaiFENesin (MUCINEX) 600 MG 12 hr tablet   Nasal congestion       Relevant Medications   fluticasone (FLONASE) 50 MCG/ACT nasal spray      Meds ordered this encounter  Medications  . guaiFENesin (MUCINEX) 600 MG 12 hr tablet    Sig: Take 2 tablets (1,200 mg total) by mouth 2 (two) times daily.    Order Specific Question:   Supervising Provider    Answer:   Tresa Garter [id number]  . ranitidine (ZANTAC 75) 75 MG tablet    Sig: Take 1 tablet (75 mg total) by mouth 2 (two) times daily.  Order Specific Question:   Supervising Provider    Answer:   Tresa Garter [id number]  . fluticasone (FLONASE) 50 MCG/ACT nasal spray    Sig: Place 2 sprays into both nostrils daily. Twice a day for 7 days, then daily as needed.    Dispense:  16 g    Refill:  0    Order Specific Question:   Supervising Provider    Answer:   Tresa Garter [id number]    Follow-up: Return if symptoms worsen or fail to improve.   Alfonse Spruce FNP

## 2016-11-29 MED FILL — FLUTICASONE PROP 50 MCG SPR: 50 | 30 days supply | Qty: 16 | Fill #0

## 2016-12-09 MED FILL — ?VENLAFAXINE HCL ER 150 MG: 150 MG | 30 days supply | Qty: 30 | Fill #2

## 2016-12-15 ENCOUNTER — Encounter (HOSPITAL_COMMUNITY): Payer: Self-pay | Admitting: Emergency Medicine

## 2016-12-15 ENCOUNTER — Emergency Department (HOSPITAL_COMMUNITY)
Admission: EM | Admit: 2016-12-15 | Discharge: 2016-12-15 | Disposition: A | Discharge status: Home / Self Care | Payer: Self-pay | Attending: Emergency Medicine | Admitting: Emergency Medicine

## 2016-12-15 DIAGNOSIS — B349 Viral infection, unspecified: Secondary | ICD-10-CM | POA: Insufficient documentation

## 2016-12-15 DIAGNOSIS — F17210 Nicotine dependence, cigarettes, uncomplicated: Secondary | ICD-10-CM | POA: Insufficient documentation

## 2016-12-15 DIAGNOSIS — Z79899 Other long term (current) drug therapy: Secondary | ICD-10-CM | POA: Insufficient documentation

## 2016-12-15 LAB — URINALYSIS, ROUTINE W REFLEX MICROSCOPIC
BILIRUBIN URINE: NEGATIVE
Glucose, UA: NEGATIVE mg/dL
Hgb urine dipstick: NEGATIVE
KETONES UR: NEGATIVE mg/dL
LEUKOCYTES UA: NEGATIVE
NITRITE: NEGATIVE
Protein, ur: NEGATIVE mg/dL
Specific Gravity, Urine: 1.005 (ref 1.005–1.030)
pH: 6 (ref 5.0–8.0)

## 2016-12-15 MED ORDER — ALBUTEROL SULFATE (2.5 MG/3ML) 0.083% IN NEBU: 5.0000 mg | INHALATION_SOLUTION | Freq: Once | RESPIRATORY_TRACT | Status: DC

## 2016-12-15 MED ORDER — ACETAMINOPHEN 500 MG PO TABS
1000.0000 mg | ORAL_TABLET | Freq: Once | ORAL | Status: AC
  Administered 2016-12-15: 1000 mg via ORAL
  Filled 2016-12-15: qty 2

## 2016-12-15 MED ORDER — VENLAFAXINE HCL ER 150 MG PO CP24
150.0000 mg | ORAL_CAPSULE | Freq: Once | ORAL | Status: AC
  Administered 2016-12-15: 150 mg via ORAL
  Filled 2016-12-15: qty 1

## 2016-12-15 NOTE — ED Provider Notes (Signed)
[facility] DEPT Provider Note   CSN: [id number] Arrival date & time: 12/15/16  1202     History   Chief Complaint  Chief Complaint  Patient presents with  . Shortness of Breath  . Headache    HPI Lauren [name] is a 51 y.o. female.  The history is provided by the patient.  Headache   This is a new problem. The current episode started 6 to 12 hours ago. The problem occurs constantly. The problem has not changed since onset.The headache is associated with nothing. The pain is located in the frontal region. The quality of the pain is described as dull. The pain is mild. The pain does not radiate. Associated symptoms include malaise/fatigue and shortness of breath (states she started breathing fast with chills and aches starting this morning). Pertinent negatives include no fever, no chest pressure, no syncope and no vomiting. She has tried nothing for the symptoms.    Past Medical History:  Diagnosis Date  . Depression     Patient Active Problem List   Diagnosis Date Noted  . Depression 11/28/2016    Past Surgical History:  Procedure Laterality Date  . ABDOMINAL HYSTERECTOMY    . BACK SURGERY    . CESAREAN SECTION    . HAND SURGERY      OB History    No data available       Home Medications    Prior to Admission medications   Medication Sig Start Date End Date Taking? Authorizing Provider  guaiFENesin (MUCINEX) 600 MG 12 hr tablet Take 2 tablets (1,200 mg total) by mouth 2 (two) times daily. 11/28/16  Yes Alfonse Spruce, FNP  ranitidine (ZANTAC 75) 75 MG tablet Take 1 tablet (75 mg total) by mouth 2 (two) times daily. 11/28/16  Yes Alfonse Spruce, FNP  venlafaxine XR (EFFEXOR-XR) 150 MG 24 hr capsule Take 1 capsule (150 mg total) by mouth every morning. 10/04/16  Yes Argentina Donovan, PA-C  azithromycin (ZITHROMAX) 250 MG tablet Take 2 today then 1 daily Patient not taking: Reported on 11/28/2016 10/04/16   Argentina Donovan, PA-C  fluticasone [facility]) 50  MCG/ACT nasal spray Place 2 sprays into both nostrils daily. Twice a day for 7 days, then daily as needed. Patient not taking: Reported on 12/15/2016 11/28/16   Alfonse Spruce, FNP    Family History No family history on file.  Social History Social History  Substance Use Topics  . Smoking status: Current Every Day Smoker    Packs/day: 0.50    Types: Cigarettes  . Smokeless tobacco: Never Used  . Alcohol use No     Allergies   Patient has no known allergies.   Review of Systems Review of Systems  Constitutional: Positive for malaise/fatigue. Negative for fever.  Respiratory: Positive for shortness of breath (states she started breathing fast with chills and aches starting this morning).   Cardiovascular: Negative for syncope.  Gastrointestinal: Negative for vomiting.  Neurological: Positive for headaches.  All other systems reviewed and are negative.    Physical Exam Updated Vital Signs BP 147/78   Pulse 74   Temp 99.3 F (37.4 C) (Oral)   Resp 16   Ht 5\' 2"  (1.575 m)   Wt 160 lb (72.6 kg)   SpO2 98%   BMI 29.26 kg/m   Physical Exam  Constitutional: She is oriented to person, place, and time. She appears well-developed and well-nourished. No distress.  HENT:  Head: Normocephalic and atraumatic.  Nose: Nose  normal.  Eyes: Conjunctivae are normal.  Neck: Neck supple. No tracheal deviation present.  Cardiovascular: Normal rate, regular rhythm and normal heart sounds.   Pulmonary/Chest: Effort normal and breath sounds normal. No respiratory distress.  Abdominal: Soft. She exhibits no distension. There is no tenderness. There is no rebound and no guarding.  Musculoskeletal: Normal range of motion. She exhibits no tenderness.  Neurological: She is alert and oriented to person, place, and time. No cranial nerve deficit. Coordination normal.  Skin: Skin is warm and dry. Capillary refill takes less than 2 seconds.  Psychiatric: She has a normal mood and affect.    Vitals reviewed.    ED Treatments / Results  Labs (all labs ordered are listed, but only abnormal results are displayed) Labs Reviewed  URINALYSIS, ROUTINE W REFLEX MICROSCOPIC - Abnormal; Notable for the following:       Result Value   Color, Urine STRAW (*)    All other components within normal limits  URINE CULTURE    EKG  EKG Interpretation  Date/Time:  Sunday December 15 2016 12:13:18 EST Ventricular Rate:  68 PR Interval:  148 QRS Duration: 80 QT Interval:  392 QTC Calculation: 416 R Axis:   18 Text Interpretation:  Sinus rhythm with marked sinus arrhythmia Normal ECG No significant change since last tracing Confirmed by Amro Winebarger MD, Tysean V[place]liet ([id number]) on 12/15/2016 3:05:11 PM       Radiology No results found.  Procedures Procedures (including critical care time)  Medications Ordered in ED Medications  acetaminophen (TYLENOL) tablet 1,000 mg (1,000 mg Oral Given 12/15/16 1402)  venlafaxine XR (EFFEXOR-XR) 24 hr capsule 150 mg (150 mg Oral Given 12/15/16 1506)     Initial Impression / Assessment and Plan / ED Course  I have reviewed the triage vital signs and the nursing notes.  Pertinent labs & imaging results that were available during my care of the patient were reviewed by me and considered in my medical decision making (see chart for details).     50  y.o. female presents with waking up feeling unwell. She relates some low back aching and urinary frequency lately. Today she had a headache and body aches and chills with a sensation of shortness of breath. She has a low grade temperature. Suspect viral illness versus UTI with well appearance so treated supportively. Offered home dose of effexor which she has run out of. Urine negative for infection, will culture d/t symptoms but will not treat empirically. Plan to follow up with PCP as needed and return precautions discussed for worsening or new concerning symptoms.   Final Clinical Impressions(s) / ED Diagnoses    Final diagnoses:  Viral illness    New Prescriptions Discharge Medication List as of 12/15/2016  3:16 PM       Leo Grosser, MD 12/16/16 [phone]

## 2016-12-15 NOTE — ED Triage Notes (Signed)
Pt. Stated, I've been SOB with a headache since this morning.

## 2016-12-15 NOTE — ED Notes (Signed)
MD at bedside. 

## 2016-12-15 NOTE — Progress Notes (Signed)
Pt not in room for breathing tx

## 2016-12-16 LAB — URINE CULTURE: Culture: <10000 — AB

## 2016-12-25 ENCOUNTER — Encounter (INDEPENDENT_AMBULATORY_CARE_PROVIDER_SITE_OTHER): Payer: Self-pay

## 2016-12-25 ENCOUNTER — Telehealth: Payer: Self-pay | Admitting: Family Medicine

## 2016-12-25 NOTE — Telephone Encounter (Signed)
Patient is in need of assistance with order supplies for her CPAP machine (tubing, mask, and filters). Her number is [phone]

## 2016-12-25 NOTE — Telephone Encounter (Signed)
Patient is presenting requesting a prescription for supplies for her C-Pap machine (tubing, masks, and filters). Please call pt at [phone].

## 2016-12-30 NOTE — Telephone Encounter (Signed)
Attempted to contact the patient to inquire about her need for CPAP supplies. Call placed to # [phone] (M) and a HIPAA compliant voicemail message was left requesting a call back to # [phone] or [phone].

## 2016-12-31 NOTE — Telephone Encounter (Signed)
Met with the patient when she came to the office today. She explained that she had her sleep study done years ago in [place]. She has a CPAP machine and has tried a full face mask and nasal mask. She is requesting to be fit for a new/softer mask if possible. She is aware that she will need to pay privately for the supplies and mask as she has no insurance.   Call placed to [facility] # [phone] to inquire about ordering CPAP supplies and a re-fit of her mask. Spoke to [place] who stated that a prescription for CPAP supplies and a re-fit of her mask be faxed to [facility] - # [phone] and they will then contact the patient to follow up.    Informed the patient that this information would be sent to Fredia Beets, [place].  She was very appreciative of the assistance.

## 2016-12-31 NOTE — Telephone Encounter (Signed)
Attempted again to contact the patient to inquire about her need for CPAP supplies. Call placed to # [phone] (H) and the message stated that the subscriber is not in service. Call also placed to # [phone] (M) and a HIPAA compliant voicemail message was left requesting a call back to # [phone] or [phone].

## 2017-01-01 ENCOUNTER — Other Ambulatory Visit: Payer: Self-pay | Admitting: Family Medicine

## 2017-01-01 DIAGNOSIS — Z9989 Dependence on other enabling machines and devices: Secondary | ICD-10-CM

## 2017-01-01 NOTE — Telephone Encounter (Signed)
I do not have records of patient's sleep study , so I do not know her current CPAP settings or if they would need to be changed. If she currently has her CPAP I would recommend scheduling appointment for CPAP titration first. So that CPAP settings be established first. After completed prescription for CPAP supplies can be written.

## 2017-01-02 ENCOUNTER — Other Ambulatory Visit: Payer: Self-pay | Admitting: Family Medicine

## 2017-01-02 NOTE — Telephone Encounter (Signed)
Call placed to the patient to inquire if she is able to obtain a copy of her sleep study report. She said that she will be able to call the provider tomorrow and request that a copy of the sleep study be faxed to [facility] Explained to her that the sleep study will be reviewed by M. Braulio Conte, FNP prior to writing any prescriptions for supplies. She was very appreciative of the call.

## 2017-01-10 ENCOUNTER — Other Ambulatory Visit: Payer: Self-pay | Admitting: Family Medicine

## 2017-01-10 ENCOUNTER — Telehealth: Payer: Self-pay | Admitting: Family Medicine

## 2017-01-10 NOTE — Telephone Encounter (Signed)
Patient called and notified about paperwork that was faxed from sleep study and the need for addtional information. She agrees to request additional information with CPAP settings included from medical office to be sent our office.

## 2017-01-15 ENCOUNTER — Other Ambulatory Visit: Payer: Self-pay | Admitting: Family Medicine

## 2017-01-15 ENCOUNTER — Telehealth: Payer: Self-pay | Admitting: Family Medicine

## 2017-01-15 DIAGNOSIS — F3289 Other specified depressive episodes: Secondary | ICD-10-CM

## 2017-01-15 MED ORDER — VENLAFAXINE HCL ER 150 MG PO CP24: 150.0000 mg | ORAL_CAPSULE | ORAL | 2 refills | Status: DC

## 2017-01-15 MED FILL — VENLAFAXINE HCL ER 150 MG C: 150 | 30 days supply | Qty: 30 | Fill #3

## 2017-01-15 NOTE — Telephone Encounter (Signed)
Medication Refill: venlafaxine XR (EFFEXOR-XR) 150 MG 24 hr capsule  Pt picked up her last refill today

## 2017-01-15 NOTE — Telephone Encounter (Signed)
Medication refills sent

## 2017-01-16 ENCOUNTER — Telehealth: Payer: Self-pay | Admitting: Family Medicine

## 2017-01-16 NOTE — Telephone Encounter (Signed)
Patient came by the office to follow up on the status of her Cpap machine. Please follow up.   Thank you.

## 2017-01-16 NOTE — Telephone Encounter (Signed)
CMA call to inform patient about RX refill is already sent to the pharmacy  Patient did not answer & there was no VM set up to leave a message

## 2017-01-16 NOTE — Telephone Encounter (Signed)
Call placed to the patient and informed her that the fax copy of sleep study results with CPAP settings has not been received yet.  She said that she would call again to request that the information be faxed to [facility].

## 2017-01-20 NOTE — Telephone Encounter (Signed)
Message received from the patient requesting a call back.   Call placed to the patient. She said that she called about her sleep study and was told that the second page of the report has the settings on it  Informed her that this information would be routed to [facility]. Braulio Conte, FNP.

## 2017-01-21 NOTE — Telephone Encounter (Signed)
Met with the patient when she came in the office today to check on the status of her CPAP supply order. Informed her that the information from her call yesterday was sent to her provider, M. Braulio Conte, FNP.

## 2017-01-23 NOTE — Telephone Encounter (Signed)
Please call patient and ask if she can provide contact information from office in [place] who performed sleep study, so that we can request additional information be faxed directly. Fax provided summarized sleep study but did not contain any numbered CPAP settings or pressure requirements.

## 2017-01-23 NOTE — Telephone Encounter (Signed)
Patient return CMA call  Patient gave CMA the information

## 2017-01-23 NOTE — Telephone Encounter (Signed)
CMA call patient regarding getting contact information of her office who did the sleep study  Patient did not answer but left a detailed message & to call us back at the office

## 2017-01-24 ENCOUNTER — Other Ambulatory Visit: Payer: Self-pay | Admitting: Family Medicine

## 2017-01-24 DIAGNOSIS — G4733 Obstructive sleep apnea (adult) (pediatric): Secondary | ICD-10-CM

## 2017-01-27 ENCOUNTER — Other Ambulatory Visit: Payer: Self-pay | Admitting: Family Medicine

## 2017-01-28 ENCOUNTER — Telehealth: Payer: Self-pay

## 2017-01-28 ENCOUNTER — Other Ambulatory Visit: Payer: Self-pay | Admitting: Family Medicine

## 2017-01-28 NOTE — Telephone Encounter (Signed)
Call placed to the patient to inform her that the prescription for her BiPAP mask has been received. She was in agreement to having the prescription faxed to [facility] to check the price for the supplies.  This CM explained to her that if she only needs a mask, the American [employer]nea Association(ASAA)  has a program for obtaining masks for $25. However, there is no fitting of the mask, it comes as is. The [facility] can fax the prescription for the mask to [employer]AA.  She said that she has access to a computer. Encouraged her to review the website for further information. She said that she would review it tonight and call back this CM if she feels that is what she would need as it would be more cost effective. She was very appreciative of the assistance.   Prescription for BiPAP supplies/mask and a copy of the sleep study were  faxed to [facility] - fax # [phone]

## 2017-01-29 NOTE — Telephone Encounter (Signed)
Thank you, Jane!

## 2017-01-31 ENCOUNTER — Telehealth: Payer: Self-pay

## 2017-01-31 NOTE — Telephone Encounter (Signed)
Call placed to [facility] to confirm receipt of the prescription for BiPAP mask/ supplies. This CM spoke to [facility] who confirmed that the information has been received and the patient has an appointment scheduled for 02/05/17 for mask fitting.

## 2017-02-07 MED FILL — VENLAFAXINE HCL ER 150 MG C: 150 | 30 days supply | Qty: 30 | Fill #0

## 2017-03-09 ENCOUNTER — Encounter: Payer: Self-pay | Admitting: Family Medicine

## 2017-03-10 ENCOUNTER — Other Ambulatory Visit: Payer: Self-pay | Admitting: Family Medicine

## 2017-03-10 ENCOUNTER — Telehealth: Payer: Self-pay

## 2017-03-10 DIAGNOSIS — F3289 Other specified depressive episodes: Secondary | ICD-10-CM

## 2017-03-10 MED ORDER — VENLAFAXINE HCL ER 150 MG PO CP24: 150.0000 mg | ORAL_CAPSULE | ORAL | 0 refills | Status: DC

## 2017-03-10 MED FILL — VENLAFAXINE HCL ER 150 MG C: 150 | 30 days supply | Qty: 30 | Fill #0

## 2017-03-10 NOTE — Telephone Encounter (Signed)
Effexor refill request

## 2017-03-10 NOTE — Telephone Encounter (Signed)
CMA call patient regarding medication refill & that for additional refill she will need an office visit   Patient Verify DOB  Patient was aware and understood   \

## 2017-04-15 ENCOUNTER — Ambulatory Visit: Payer: Self-pay | Attending: Family Medicine | Admitting: Family Medicine

## 2017-04-15 ENCOUNTER — Encounter: Payer: Self-pay | Admitting: Family Medicine

## 2017-04-15 VITALS — BP 146/72 | HR 80 | Temp 99.4°F | Resp 18 | Ht 62.0 in | Wt 158.6 lb

## 2017-04-15 DIAGNOSIS — I10 Essential (primary) hypertension: Secondary | ICD-10-CM | POA: Insufficient documentation

## 2017-04-15 DIAGNOSIS — G4733 Obstructive sleep apnea (adult) (pediatric): Secondary | ICD-10-CM | POA: Insufficient documentation

## 2017-04-15 DIAGNOSIS — F339 Major depressive disorder, recurrent, unspecified: Secondary | ICD-10-CM

## 2017-04-15 DIAGNOSIS — K219 Gastro-esophageal reflux disease without esophagitis: Secondary | ICD-10-CM | POA: Insufficient documentation

## 2017-04-15 DIAGNOSIS — F329 Major depressive disorder, single episode, unspecified: Secondary | ICD-10-CM | POA: Insufficient documentation

## 2017-04-15 DIAGNOSIS — Z760 Encounter for issue of repeat prescription: Secondary | ICD-10-CM | POA: Insufficient documentation

## 2017-04-15 LAB — POCT UA - MICROALBUMIN
CREATININE, POC: 100 mg/dL
Microalbumin Ur, POC: 10 mg/L

## 2017-04-15 MED ORDER — VENLAFAXINE HCL ER 150 MG PO CP24: 150.0000 mg | ORAL_CAPSULE | ORAL | 1 refills | Status: DC

## 2017-04-15 MED ORDER — HYDROCHLOROTHIAZIDE 25 MG PO TABS: 25.0000 mg | ORAL_TABLET | Freq: Every day | ORAL | 2 refills | Status: DC

## 2017-04-15 MED FILL — HYDROCHLOROTHIAZIDE 25 MG T: 25 | 30 days supply | Qty: 30 | Fill #0

## 2017-04-15 MED FILL — VENLAFAXINE HCL ER 150 MG C: 150 | 30 days supply | Qty: 30 | Fill #0

## 2017-04-15 NOTE — Patient Instructions (Signed)

## 2017-04-15 NOTE — Progress Notes (Signed)
Subjective:  Patient ID: Lauren [name], female    DOB: [birth year]  Age: 51 y.o. MRN: [id number]  CC: Medication Refill   HPI Lauren [name] presents for medication follow up. PMH of depression, OSA, and GERD. History of depression for She complains of depressed mood. Onset was approximately 10 years ago, controlled since that time.  She denies current suicidal and homicidal plan or intent. Possible organic causes contributing are: none.  Risk factors: previous episode of depression and history of suicide attempt in the past. Reports finances as a trigger. Previous treatment includes Effexor and couseling in the past. She reports receiving counseling 1 year ago prior to moving to [facility].Marland KitchenShe complains of the following side effects from the treatment: none. She refuses referral to counseling or speaking with the LCSW. Follow-up of elevated blood pressures. She is not exercising and is not adherent to low salt diet.  Cardiac symptoms none. Patient denies chest pain, chest pressure/discomfort, claudication, dyspnea, lower extremity edema, near-syncope, palpitations and syncope.  Cardiovascular risk factors: sedentary lifestyle and smoking/ tobacco exposure. Use of agents associated with hypertension: none. History of target organ damage: none. Reports adherence with CPAP. GERD symptoms well controlled with OTC zantac as needed.     Outpatient Medications Prior to Visit  Medication Sig Dispense Refill  . fluticasone (FLONASE) 50 MCG/ACT nasal spray Place 2 sprays into both nostrils daily. Twice a day for 7 days, then daily as needed. 16 g 0  . ranitidine (ZANTAC 75) 75 MG tablet Take 1 tablet (75 mg total) by mouth 2 (two) times daily.    Marland Kitchen venlafaxine XR (EFFEXOR-XR) 150 MG 24 hr capsule Take 1 capsule (150 mg total) by mouth every morning. 90 capsule 0  . azithromycin (ZITHROMAX) 250 MG tablet Take 2 today then 1 daily (Patient not taking: Reported on 11/28/2016) 6 tablet 0  . guaiFENesin (MUCINEX) 600 MG 12  hr tablet Take 2 tablets (1,200 mg total) by mouth 2 (two) times daily.     No facility-administered medications prior to visit.     ROS Review of Systems  Constitutional: Negative.   HENT: Negative.   Eyes: Negative.   Respiratory: Negative.   Cardiovascular: Negative.   Gastrointestinal: Negative.   Skin: Negative.   Psychiatric/Behavioral: Negative.     Objective:  BP (!) 146/72 (Patient Position: Sitting)   Pulse 80   Temp 99.4 F (37.4 C)   Resp 18   Ht 5\' 2"  (1.575 m)   Wt 158 lb 9.6 oz (71.9 kg)   SpO2 100%   BMI 29.01 kg/m   BP/Weight 04/15/2017 7/51/7001 04/23/9448  Systolic BP 675 916 384  Diastolic BP 72 96 76  Wt. (Lbs) 158.6 160 160.4  BMI 29.01 29.26 29.34   Physical Exam  Constitutional: She appears well-developed and well-nourished.  HENT:  Head: Normocephalic and atraumatic.  Right Ear: External ear normal.  Left Ear: External ear normal.  Nose: Nose normal.  Mouth/Throat: Oropharynx is clear and moist.  Eyes: Conjunctivae are normal. Pupils are equal, round, and reactive to light.  Neck: No JVD present.  Cardiovascular: Normal rate, regular rhythm, normal heart sounds and intact distal pulses.   Pulmonary/Chest: Effort normal and breath sounds normal.  Abdominal: Soft. Bowel sounds are normal. There is no tenderness.  Skin: Skin is warm and dry.  Psychiatric: She expresses no homicidal and no suicidal ideation. She expresses no suicidal plans and no homicidal plans.  Nursing note and vitals reviewed.  Assessment & Plan:   Problem List  Items Addressed This Visit      Other   Depression - Primary   Relevant Medications   venlafaxine XR (EFFEXOR-XR) 150 MG 24 hr capsule    Other Visit Diagnoses    Essential hypertension       Schedule BP recheck in 2 weeks with clinic RN..   If BP is greater than 90/60 (MAP 65 or greater) but not less than 130/80 may add amlodipine 2.5 mg QD    and recheck in another 2 weeks with clinic RN.    Follow up  with PCP in 3 months for HTN.   Relevant Medications   hydrochlorothiazide (HYDRODIURIL) 25 MG tablet   Other Relevant Orders   Lipid Panel   POCT UA - Microalbumin (Completed)   Medication refill          Meds ordered this encounter  Medications  . hydrochlorothiazide (HYDRODIURIL) 25 MG tablet    Sig: Take 1 tablet (25 mg total) by mouth daily.    Dispense:  30 tablet    Refill:  2    Order Specific Question:   Supervising Provider    Answer:   Tresa Garter [id number]  . venlafaxine XR (EFFEXOR-XR) 150 MG 24 hr capsule    Sig: Take 1 capsule (150 mg total) by mouth every morning.    Dispense:  90 capsule    Refill:  1    Order Specific Question:   Supervising Provider    Answer:   Tresa Garter [[id number]]    Follow-up: Return in about 2 weeks (around 04/29/2017) for BP check with [place].   Alfonse Spruce FNP

## 2017-04-16 ENCOUNTER — Other Ambulatory Visit: Payer: Self-pay

## 2017-04-21 ENCOUNTER — Telehealth: Payer: Self-pay | Admitting: Family Medicine

## 2017-04-21 NOTE — Telephone Encounter (Signed)
Patient called stating hydrochlorothiazide (HYDRODIURIL) 25 MG tablet is causing her heart to race and feels tightness in chest,  Pt is still taking medication and is unsure if she should stop it please f/up

## 2017-04-29 ENCOUNTER — Ambulatory Visit: Payer: Self-pay | Attending: Family Medicine | Admitting: *Deleted

## 2017-04-29 VITALS — BP 118/82 | HR 92

## 2017-04-29 DIAGNOSIS — R350 Frequency of micturition: Secondary | ICD-10-CM | POA: Insufficient documentation

## 2017-04-29 DIAGNOSIS — Z0130 Encounter for examination of blood pressure without abnormal findings: Secondary | ICD-10-CM

## 2017-04-29 LAB — POCT URINALYSIS DIPSTICK
BILIRUBIN UA: NEGATIVE
Blood, UA: NEGATIVE
Glucose, UA: NEGATIVE
KETONES UA: NEGATIVE
LEUKOCYTES UA: NEGATIVE
NITRITE UA: NEGATIVE
PH UA: 5.5 (ref 5.0–8.0)
PROTEIN UA: NEGATIVE
Spec Grav, UA: 1.015 (ref 1.010–1.025)
Urobilinogen, UA: 0.2 E.U./dL

## 2017-04-29 NOTE — Progress Notes (Signed)
Pt arrived to [facility], Pt alert and oriented and arrives in good spirits. Last OV  04/15/2017 with M. Hairston,FNP.   Pt denies chest pain, SOB, HA, dizziness, or blurred vision. Verified medication. Pt states she is not taking HCTZ. She stopped this medication due to feeling having like she has increase heart rate.   Blood pressure reading: 124/80- left arm 140/80- right arm Recheck in right arm- 118/82   Pt also c/o urinary frequency and back pain. She requested to check urine for UTI.   Encourage pt to f/u with her PCP for back pain.

## 2017-05-12 MED FILL — VENLAFAXINE HCL ER 150 MG C: 150 | 30 days supply | Qty: 30 | Fill #1

## 2017-06-12 ENCOUNTER — Ambulatory Visit: Payer: Self-pay | Attending: Family Medicine | Admitting: Family Medicine

## 2017-06-12 ENCOUNTER — Encounter: Payer: Self-pay | Admitting: Family Medicine

## 2017-06-12 VITALS — BP 131/85 | HR 81 | Temp 98.1°F | Resp 18 | Ht 62.0 in | Wt 159.2 lb

## 2017-06-12 DIAGNOSIS — R1013 Epigastric pain: Secondary | ICD-10-CM | POA: Insufficient documentation

## 2017-06-12 DIAGNOSIS — Z79899 Other long term (current) drug therapy: Secondary | ICD-10-CM | POA: Insufficient documentation

## 2017-06-12 MED ORDER — OMEPRAZOLE 20 MG PO CPDR: 20.0000 mg | DELAYED_RELEASE_CAPSULE | Freq: Every day | ORAL | 3 refills | Status: DC

## 2017-06-12 MED FILL — VENLAFAXINE HCL ER 150 MG C: 150 | 30 days supply | Qty: 30 | Fill #2

## 2017-06-12 MED FILL — ?OMEPRAZOLE DR 20 MG CAPSUL: 20 | 30 days supply | Qty: 30 | Fill #0

## 2017-06-12 NOTE — Patient Instructions (Signed)
Abdominal Pain, Adult Many things can cause belly (abdominal) pain. Most times, belly pain is not dangerous. Many cases of belly pain can be watched and treated at home. Sometimes belly pain is serious, though. Your doctor will try to find the cause of your belly pain. Follow these instructions at home:  Take over-the-counter and prescription medicines only as told by your doctor. Do not take medicines that help you poop (laxatives) unless told to by your doctor.  Drink enough fluid to keep your pee (urine) clear or pale yellow.  Watch your belly pain for any changes.  Keep all follow-up visits as told by your doctor. This is important. Contact a doctor if:  Your belly pain changes or gets worse.  You are not hungry, or you lose weight without trying.  You are having trouble pooping (constipated) or have watery poop (diarrhea) for more than 2-3 days.  You have pain when you pee or poop.  Your belly pain wakes you up at night.  Your pain gets worse with meals, after eating, or with certain foods.  You are throwing up and cannot keep anything down.  You have a fever. Get help right away if:  Your pain does not go away as soon as your doctor says it should.  You cannot stop throwing up.  Your pain is only in areas of your belly, such as the right side or the left lower part of the belly.  You have bloody or black poop, or poop that looks like tar.  You have very bad pain, cramping, or bloating in your belly.  You have signs of not having enough fluid or water in your body (dehydration), such as: ? Dark pee, very little pee, or no pee. ? Cracked lips. ? Dry mouth. ? Sunken eyes. ? Sleepiness. ? Weakness. This information is not intended to replace advice given to you by your health care provider. Make sure you discuss any questions you have with your health care provider. Document Released: 03/25/2008 Document Revised: 04/26/2016 Document Reviewed: 03/20/2016 Elsevier  Interactive Patient Education  2017 [place] for Gastroesophageal Reflux Disease, Adult When you have gastroesophageal reflux disease (GERD), the foods you eat and your eating habits are very important. Choosing the right foods can help ease your discomfort. What guidelines do I need to follow?  Choose fruits, vegetables, whole grains, and low-fat dairy products.  Choose low-fat meat, fish, and poultry.  Limit fats such as oils, salad dressings, butter, nuts, and avocado.  Keep a food diary. This helps you identify foods that cause symptoms.  Avoid foods that cause symptoms. These may be different for everyone.  Eat small meals often instead of 3 large meals a day.  Eat your meals slowly, in a place where you are relaxed.  Limit fried foods.  Cook foods using methods other than frying.  Avoid drinking alcohol.  Avoid drinking large amounts of liquids with your meals.  Avoid bending over or lying down until 2-3 hours after eating. What foods are not recommended? These are some foods and drinks that may make your symptoms worse: Vegetables Tomatoes. Tomato juice. Tomato and spaghetti sauce. Chili peppers. Onion and garlic. Horseradish. Fruits Oranges, grapefruit, and lemon (fruit and juice). Meats High-fat meats, fish, and poultry. This includes hot dogs, ribs, ham, sausage, salami, and bacon. Dairy Whole milk and chocolate milk. Sour cream. Cream. Butter. Ice cream. Cream cheese. Drinks Coffee and tea. Bubbly (carbonated) drinks or energy drinks. Condiments Hot sauce. Barbecue sauce.  Sweets/Desserts Chocolate and cocoa. Donuts. Peppermint and spearmint. Fats and Oils High-fat foods. This includes [place] fries and potato chips. Other Vinegar. Strong spices. This includes black pepper, white pepper, red pepper, cayenne, curry powder, cloves, ginger, and chili powder. The items listed above may not be a complete list of foods and drinks to avoid. Contact  your dietitian for more information. This information is not intended to replace advice given to you by your health care provider. Make sure you discuss any questions you have with your health care provider. Document Released: 04/07/2012 Document Revised: 03/14/2016 Document Reviewed: 08/11/2013 Elsevier Interactive Patient Education  2017 [employer].

## 2017-06-12 NOTE — Progress Notes (Signed)
Patient is here for upper abdomen sharp pain that comes & goes

## 2017-06-12 NOTE — Progress Notes (Signed)
Subjective:  Patient ID: Lauren [name], female    DOB: 08/14/1966  Age: 51 y.o. MRN: [id number]  CC: Abdominal Pain   HPI Lauren [name] presents for complains of abdominal pain. The pain is described as aching and cramping, and is moderate in intensity. Pain is located in the epigastric without radiation. Onset was 2 weeks ago. Symptoms have been unchanged since. Aggravating factors: none.  Alleviating factors: NSAIDs. Associated symptoms: none. The patient denies diarrhea, nausea, vomiting, hematochezia, and melena.   Outpatient Medications Prior to Visit  Medication Sig Dispense Refill  . venlafaxine XR (EFFEXOR-XR) 150 MG 24 hr capsule Take 1 capsule (150 mg total) by mouth every morning. 90 capsule 1  . fluticasone (FLONASE) 50 MCG/ACT nasal spray Place 2 sprays into both nostrils daily. Twice a day for 7 days, then daily as needed. 16 g 0  . hydrochlorothiazide (HYDRODIURIL) 25 MG tablet Take 1 tablet (25 mg total) by mouth daily. (Patient not taking: Reported on 04/29/2017) 30 tablet 2  . ranitidine (ZANTAC 75) 75 MG tablet Take 1 tablet (75 mg total) by mouth 2 (two) times daily.     No facility-administered medications prior to visit.     ROS Review of Systems  Constitutional: Negative.   HENT: Negative.   Eyes: Negative.   Respiratory: Negative.   Cardiovascular: Negative.   Gastrointestinal: Positive for abdominal pain.  Genitourinary: Negative.    Objective:  BP 131/85 (BP Location: Left Arm, Patient Position: Sitting, Cuff Size: Normal)   Pulse 81   Temp 98.1 F (36.7 C) (Oral)   Resp 18   Ht 5' 2" (1.575 m)   Wt 159 lb 3.2 oz (72.2 kg)   SpO2 98%   BMI 29.12 kg/m   BP/Weight 06/12/2017 04/29/2017 06/03/4817  Systolic BP 563 149 702  Diastolic BP 85 82 72  Wt. (Lbs) 159.2 - 158.6  BMI 29.12 - 29.01   Physical Exam  Constitutional: She appears well-developed and well-nourished.  HENT:  Head: Normocephalic and atraumatic.  Right Ear: External ear normal.    Left Ear: External ear normal.  Nose: Nose normal.  Mouth/Throat: Oropharynx is clear and moist.  Eyes: Pupils are equal, round, and reactive to light. Conjunctivae are normal.  Neck: Normal range of motion. Neck supple. No JVD present.  Cardiovascular: Normal rate, regular rhythm, normal heart sounds and intact distal pulses.   Pulmonary/Chest: Effort normal and breath sounds normal.  Abdominal: Soft. Bowel sounds are normal. There is tenderness.  Skin: Skin is warm and dry.  Psychiatric: She has a normal mood and affect.  Nursing note and vitals reviewed.   Assessment & Plan:   Problem List Items Addressed This Visit    None    Visit Diagnoses    Epigastric pain    -  Primary   Relevant Medications   omeprazole (PRILOSEC) 20 MG capsule   Other Relevant Orders   CMP14+EGFR (Completed)   Lipid Panel (Completed)   Lipase (Completed)   H. pylori breath test (Completed)      Meds ordered this encounter  Medications  . omeprazole (PRILOSEC) 20 MG capsule    Sig: Take 1 capsule (20 mg total) by mouth daily.    Dispense:  30 capsule    Refill:  3    Order Specific Question:   Supervising Provider    Answer:   Tresa Garter [[id number]]    Follow-up: Return in about 3 months (around 09/12/2017), or if symptoms worsen or fail to improve, for  GERD.   Alfonse Spruce FNP

## 2017-06-13 LAB — CMP14+EGFR
ALBUMIN: 4.7 g/dL (ref 3.5–5.5)
ALT: 21 IU/L (ref 0–32)
AST: 50 IU/L — AB (ref 0–40)
Albumin/Globulin Ratio: 1.7 (ref 1.2–2.2)
Alkaline Phosphatase: 90 IU/L (ref 39–117)
BUN/Creatinine Ratio: 19 (ref 9–23)
BUN: 14 mg/dL (ref 6–24)
Bilirubin Total: 0.2 mg/dL (ref 0.0–1.2)
CALCIUM: 9.9 mg/dL (ref 8.7–10.2)
CO2: 22 mmol/L (ref 20–29)
CREATININE: 0.72 mg/dL (ref 0.57–1.00)
Chloride: 102 mmol/L (ref 96–106)
GFR, EST AFRICAN AMERICAN: 112 mL/min/{1.73_m2} (ref >59)
GFR, EST NON AFRICAN AMERICAN: 97 mL/min/{1.73_m2} (ref >59)
GLOBULIN, TOTAL: 2.8 g/dL (ref 1.5–4.5)
Glucose: 72 mg/dL (ref 65–99)
Potassium: 4.3 mmol/L (ref 3.5–5.2)
SODIUM: 141 mmol/L (ref 134–144)
TOTAL PROTEIN: 7.5 g/dL (ref 6.0–8.5)

## 2017-06-13 LAB — LIPID PANEL
CHOL/HDL RATIO: 3.6 ratio (ref 0.0–4.4)
Cholesterol, Total: 205 mg/dL — ABNORMAL HIGH (ref 100–199)
HDL: 57 mg/dL (ref >39)
LDL CALC: 135 mg/dL — AB (ref 0–99)
TRIGLYCERIDES: 66 mg/dL (ref 0–149)
VLDL Cholesterol Cal: 13 mg/dL (ref 5–40)

## 2017-06-13 LAB — LIPASE: Lipase: 58 U/L (ref 14–72)

## 2017-06-13 LAB — H. PYLORI BREATH TEST: H pylori Breath Test: NEGATIVE

## 2017-06-19 ENCOUNTER — Other Ambulatory Visit: Payer: Self-pay | Admitting: Family Medicine

## 2017-06-19 DIAGNOSIS — E782 Mixed hyperlipidemia: Secondary | ICD-10-CM

## 2017-06-19 MED ORDER — ATORVASTATIN CALCIUM 20 MG PO TABS: 20.0000 mg | ORAL_TABLET | Freq: Every day | ORAL | 2 refills | Status: DC

## 2017-06-19 MED FILL — ?ATORVASTATIN 20 MG TABLET: 20 | 30 days supply | Qty: 30 | Fill #0

## 2017-06-24 ENCOUNTER — Telehealth: Payer: Self-pay

## 2017-06-24 NOTE — Telephone Encounter (Signed)
-----   Message from Alfonse Spruce, [place] sent at 06/19/2017  1:41 PM EDT ----- Liver function shows mild elevation. Cholesterol levels are also elevated.This can increase your risk of heart disease overtime. You will be prescribed atorvastatin to help lower risk. Start eating a diet low in saturated fat. Limit your intake of fried foods, red meats, and whole milk. Increase activity.  Kidney function normal. Lipase normal. Lipase elevated with pancreatitis. H.pylori is negative. H.pylori is a bacteria that can infect the stomach and cause stomach ulcers.

## 2017-06-24 NOTE — Telephone Encounter (Signed)
-----   Message from Alfonse Spruce, [place] sent at 06/19/2017  1:41 PM EDT ----- Liver function shows mild elevation. Cholesterol levels are also elevated.This can increase your risk of heart disease overtime. You will be prescribed atorvastatin to help lower risk. Start eating a diet low in saturated fat. Limit your intake of fried foods, red meats, and whole milk. Increase activity.  Kidney function normal. Lipase normal. Lipase elevated with pancreatitis. H.pylori is negative. H.pylori is a bacteria that can infect the stomach and cause stomach ulcers.

## 2017-06-24 NOTE — Telephone Encounter (Signed)
CMA call regarding lab results   Patient verify DOB  Patient was aware and understood  

## 2017-07-15 MED FILL — VENLAFAXINE HCL ER 150 MG C: 150 | 30 days supply | Qty: 30 | Fill #3

## 2017-07-29 ENCOUNTER — Ambulatory Visit: Payer: Self-pay | Attending: Family Medicine | Admitting: *Deleted

## 2017-07-29 DIAGNOSIS — R51 Headache: Secondary | ICD-10-CM | POA: Insufficient documentation

## 2017-07-29 DIAGNOSIS — R519 Headache, unspecified: Secondary | ICD-10-CM

## 2017-07-29 NOTE — Progress Notes (Signed)
Patient Triage Assessment Form Todays Date: 07/29/2017 Name: Ladonna Vanorder  DOB: 02/11/2017 Reason for walkin: Sore bumps on scalp When did your symptoms start? 1 week ago Please list symptoms: stinging pain in scalp, top of head, very itch Are you having pain: yes   Assessment Pink and white bumps from braids and extentions attached to scalp. Pt denies drainage or pustules from scalp. States it is very itchy. She has wash with salt water as advised by pharmacist. Advised by pharmacist to make appointment with PCP for ATB. Denies pain in the back of her head. Just at the top region and around edges. She has taken Ibuprofen for pain. Pt advised to loosen up the extensions, continue to moisturize scalp, and to find a comfortable way to wear hair to remove tension from root. Aware to  look for s/s of infection and call office by Thursday if not better for appointment.   Vital Signs:  T: P: R: SpO2 BP:  Plan Pt advised to loosen up the extensions, continue to moisturize scalp, and to find a comfortable way to wear hair to remove tension from root. Aware to  look for s/s of infection and call office by Thursday if not better for appointment.

## 2017-08-07 MED FILL — ?ATORVASTATIN 20 MG TABLET: 20 | 30 days supply | Qty: 30 | Fill #1

## 2017-08-18 MED FILL — VENLAFAXINE HCL ER 150 MG C: 150 | 30 days supply | Qty: 30 | Fill #1

## 2017-08-25 ENCOUNTER — Ambulatory Visit: Payer: Self-pay | Attending: Family Medicine | Admitting: Family Medicine

## 2017-08-25 ENCOUNTER — Encounter: Payer: Self-pay | Admitting: Family Medicine

## 2017-08-25 VITALS — BP 129/82 | HR 95 | Temp 99.0°F | Resp 18 | Ht 62.0 in | Wt 161.6 lb

## 2017-08-25 DIAGNOSIS — M545 Low back pain, unspecified: Secondary | ICD-10-CM

## 2017-08-25 DIAGNOSIS — R350 Frequency of micturition: Secondary | ICD-10-CM | POA: Insufficient documentation

## 2017-08-25 DIAGNOSIS — R7303 Prediabetes: Secondary | ICD-10-CM | POA: Insufficient documentation

## 2017-08-25 DIAGNOSIS — L03811 Cellulitis of head [any part, except face]: Secondary | ICD-10-CM | POA: Insufficient documentation

## 2017-08-25 DIAGNOSIS — Z79899 Other long term (current) drug therapy: Secondary | ICD-10-CM | POA: Insufficient documentation

## 2017-08-25 LAB — POCT GLYCOSYLATED HEMOGLOBIN (HGB A1C): Hemoglobin A1C: 5.9

## 2017-08-25 LAB — POCT URINALYSIS DIPSTICK
BILIRUBIN UA: NEGATIVE
GLUCOSE UA: NEGATIVE
Ketones, UA: NEGATIVE
Nitrite, UA: NEGATIVE
Protein, UA: NEGATIVE
RBC UA: NEGATIVE
Spec Grav, UA: 1.015 (ref 1.010–1.025)
UROBILINOGEN UA: 0.2 U/dL
pH, UA: 7.5 (ref 5.0–8.0)

## 2017-08-25 MED ORDER — ACETAMINOPHEN 500 MG PO TABS: 500.0000 mg | ORAL_TABLET | Freq: Four times a day (QID) | ORAL | 0 refills | Status: DC | PRN

## 2017-08-25 MED ORDER — CEPHALEXIN 500 MG PO CAPS: 500.0000 mg | ORAL_CAPSULE | Freq: Four times a day (QID) | ORAL | 0 refills | Status: DC

## 2017-08-25 MED FILL — CEPHALEXIN 500 MG CAPSULE: 500 | 5 days supply | Qty: 20 | Fill #0

## 2017-08-25 NOTE — Patient Instructions (Addendum)
Cellulitis, Adult Cellulitis is a skin infection. The infected area is usually red and sore. This condition occurs most often in the arms and lower legs. It is very important to get treated for this condition. Follow these instructions at home:  Take over-the-counter and prescription medicines only as told by your doctor.  If you were prescribed an antibiotic medicine, take it as told by your doctor. Do not stop taking the antibiotic even if you start to feel better.  Drink enough fluid to keep your pee (urine) clear or pale yellow.  Do not touch or rub the infected area.  Raise (elevate) the infected area above the level of your heart while you are sitting or lying down.  Place warm or cold wet cloths (warm or cold compresses) on the infected area. Do this as told by your doctor.  Keep all follow-up visits as told by your doctor. This is important. These visits let your doctor make sure your infection is not getting worse. Contact a doctor if:  You have a fever.  Your symptoms do not get better after 1-2 days of treatment.  Your bone or joint under the infected area starts to hurt after the skin has healed.  Your infection comes back. This can happen in the same area or another area.  You have a swollen bump in the infected area.  You have new symptoms.  You feel ill and also have muscle aches and pains. Get help right away if:  Your symptoms get worse.  You feel very sleepy.  You throw up (vomit) or have watery poop (diarrhea) for a long time.  There are red streaks coming from the infected area.  Your red area gets larger.  Your red area turns darker. This information is not intended to replace advice given to you by your health care provider. Make sure you discuss any questions you have with your health care provider. Document Released: 03/25/2008 Document Revised: 03/14/2016 Document Reviewed: 08/16/2015 Elsevier Interactive Patient Education  2018 [employer].   Prediabetes Prediabetes is the condition of having a blood sugar (blood glucose) level that is higher than it should be, but not high enough for you to be diagnosed with type 2 diabetes. Having prediabetes puts you at risk for developing type 2 diabetes (type 2 diabetes mellitus). Prediabetes may be called impaired glucose tolerance or impaired fasting glucose. Prediabetes usually does not cause symptoms. Your health care provider can diagnose this condition with blood tests. You may be tested for prediabetes if you are overweight and if you have at least one other risk factor for prediabetes. Risk factors for prediabetes include:  Having a family member with type 2 diabetes.  Being overweight or obese.  Being older than age 56.  Being of American-Indian, African-American, Hispanic/Latino, or Asian/Pacific Islander descent.  Having an inactive (sedentary) lifestyle.  Having a history of gestational diabetes or polycystic ovarian syndrome (PCOS).  Having low levels of good cholesterol (HDL-C) or high levels of blood fats (triglycerides).  Having high blood pressure.  What is blood glucose and how is blood glucose measured?  Blood glucose refers to the amount of glucose in your bloodstream. Glucose comes from eating foods that contain sugars and starches (carbohydrates) that the body breaks down into glucose. Your blood glucose level may be measured in mg/dL (milligrams per deciliter) or mmol/L (millimoles per liter).Your blood glucose may be checked with one or more of the following blood tests:  A fasting blood glucose (FBG) test. You  will not be allowed to eat (you will fast) for at least 8 hours before a blood sample is taken. ? A normal range for FBG is 70-100 mg/dl (3.9-5.6 mmol/L).  An A1c (hemoglobin A1c) blood test. This test provides information about blood glucose control over the previous 2?72months.  An oral glucose tolerance test (OGTT). This test measures your  blood glucose twice: ? After fasting. This is your baseline level. ? Two hours after you drink a beverage that contains glucose.  You may be diagnosed with prediabetes:  If your FBG is 100?125 mg/dL (5.6-6.9 mmol/L).  If your A1c level is 5.7?6.4%.  If your OGGT result is 140?199 mg/dL (7.8-11 mmol/L).  These blood tests may be repeated to confirm your diagnosis. What happens if blood glucose is too high? The pancreas produces a hormone (insulin) that helps move glucose from the bloodstream into cells. When cells in the body do not respond properly to insulin that the body makes (insulin resistance), excess glucose builds up in the blood instead of going into cells. As a result, high blood glucose (hyperglycemia) can develop, which can cause many complications. This is a symptom of prediabetes. What can happen if blood glucose stays higher than normal for a long time? Having high blood glucose for a long time is dangerous. Too much glucose in your blood can damage your nerves and blood vessels. Long-term damage can lead to complications from diabetes, which may include:  Heart disease.  Stroke.  Blindness.  Kidney disease.  Depression.  Poor circulation in the feet and legs, which could lead to surgical removal (amputation) in severe cases.  How can prediabetes be prevented from turning into type 2 diabetes?  To help prevent type 2 diabetes, take the following actions:  Be physically active. ? Do moderate-intensity physical activity for at least 30 minutes on at least 5 days of the week, or as much as told by your health care provider. This could be brisk walking, biking, or water aerobics. ? Ask your health care provider what activities are safe for you. A mix of physical activities may be best, such as walking, swimming, cycling, and strength training.  Lose weight as told by your health care provider. ? Losing 5-7% of your body weight can reverse insulin resistance. ? Your  health care provider can determine how much weight loss is best for you and can help you lose weight safely.  Follow a healthy meal plan. This includes eating lean proteins, complex carbohydrates, fresh fruits and vegetables, low-fat dairy products, and healthy fats. ? Follow instructions from your health care provider about eating or drinking restrictions. ? Make an appointment to see a diet and nutrition specialist (registered dietitian) to help you create a healthy eating plan that is right for you.  Do not smoke or use any tobacco products, such as cigarettes, chewing tobacco, and e-cigarettes. If you need help quitting, ask your health care provider.  Take over-the-counter and prescription medicines as told by your health care provider. You may be prescribed medicines that help lower the risk of type 2 diabetes.  This information is not intended to replace advice given to you by your health care provider. Make sure you discuss any questions you have with your health care provider. Document Released: 01/29/2016 Document Revised: 03/14/2016 Document Reviewed: 11/28/2015 Elsevier Interactive Patient Education  [employer].

## 2017-08-25 NOTE — Progress Notes (Signed)
Patient complains scalp issues pain itchy & redness & bump on scalp

## 2017-08-25 NOTE — Progress Notes (Signed)
Subjective:  Patient ID: Lauren [name], female    DOB: 1966-05-22  Age: 51 y.o. MRN: [id number]  CC: skin complaint   HPI Makari Sanko presents for skin complaint. Onset 2 months ago. Symptoms include itching, papules, dry scalp, and drainage. She reports papueles and drainage has resolved but itching and dry scalp remain. Location to the parietal region of scalp. She reports symptoms began after using new hair braiding extensions. She reports she it was related to the hair dye in the extension. She denies any traction of the scalp. Urinary frequency: Onset last week. She denies any foul smelling or cloudy urine or hematuria. She does reports low back pain. No history of injury.     Outpatient Medications Prior to Visit  Medication Sig Dispense Refill  . atorvastatin (LIPITOR) 20 MG tablet Take 1 tablet (20 mg total) by mouth daily. 30 tablet 2  . venlafaxine XR (EFFEXOR-XR) 150 MG 24 hr capsule Take 1 capsule (150 mg total) by mouth every morning. 90 capsule 1  . fluticasone (FLONASE) 50 MCG/ACT nasal spray Place 2 sprays into both nostrils daily. Twice a day for 7 days, then daily as needed. 16 g 0  . hydrochlorothiazide (HYDRODIURIL) 25 MG tablet Take 1 tablet (25 mg total) by mouth daily. (Patient not taking: Reported on 04/29/2017) 30 tablet 2  . omeprazole (PRILOSEC) 20 MG capsule Take 1 capsule (20 mg total) by mouth daily. 30 capsule 3   No facility-administered medications prior to visit.     ROS Review of Systems  Respiratory: Negative.   Cardiovascular: Negative.   Gastrointestinal: Negative.   Genitourinary: Positive for frequency.  Musculoskeletal: Positive for back pain.  Skin:       Scalp itching       Objective:  BP 129/82 (BP Location: Left Arm, Patient Position: Sitting, Cuff Size: Normal)   Pulse 95   Temp 99 F (37.2 C) (Oral)   Resp 18   Ht 5\' 2"  (1.575 m)   Wt 161 lb 9.6 oz (73.3 kg)   SpO2 97%   BMI 29.56 kg/m   BP/Weight 08/25/2017 06/12/2017  5/68/1275  Systolic BP 170 017 494  Diastolic BP 82 85 82  Wt. (Lbs) 161.6 159.2 -  BMI 29.56 29.12 -     Physical Exam  Constitutional: She appears well-developed and well-nourished.  Cardiovascular: Normal rate, regular rhythm, normal heart sounds and intact distal pulses.  Pulmonary/Chest: Effort normal and breath sounds normal.  Abdominal: Soft. Bowel sounds are normal. There is no tenderness.  Musculoskeletal: Normal range of motion.  Skin: Skin is warm and dry. No lesion noted.  Erythema and swelling of the hair follicles of the scalp. Tenderness to palpation. No patchy hair loss present.  Nursing note and vitals reviewed.    Assessment & Plan:   1. Cellulitis of scalp Avoid use of particular hair product. - cephALEXin (KEFLEX) 500 MG capsule; Take 1 capsule (500 mg total) 4 (four) times daily by mouth.  Dispense: 20 capsule; Refill: 0  2. Urine frequency U/a negative for UTI [id number] indicates prediabetes, recommend dietary changes and incorp.exercise. - POCT urinalysis dipstick - POCT glycosylated hemoglobin (Hb A1C)  3. Acute right-sided low back pain without sciatica  - acetaminophen (TYLENOL) 500 MG tablet; Take 1 tablet (500 mg total) every 6 (six) hours as needed by mouth.  Dispense: 30 tablet; Refill: 0  4. Prediabetes  - POCT glycosylated hemoglobin (Hb A1C)      Follow-up: Return in about 3 months (around 11/25/2017),  or if symptoms worsen or fail to improve, for Prediabetes.   Alfonse Spruce FNP

## 2017-09-05 ENCOUNTER — Ambulatory Visit: Payer: Self-pay

## 2017-09-18 MED FILL — VENLAFAXINE HCL ER 150 MG C: 150 | 30 days supply | Qty: 30 | Fill #2

## 2017-09-19 MED FILL — ?ATORVASTATIN 20 MG TABLET: 20 | 30 days supply | Qty: 30 | Fill #2

## 2017-09-22 ENCOUNTER — Ambulatory Visit: Payer: Self-pay | Attending: Family Medicine | Admitting: *Deleted

## 2017-09-22 VITALS — BP 112/80

## 2017-09-22 DIAGNOSIS — I10 Essential (primary) hypertension: Secondary | ICD-10-CM | POA: Insufficient documentation

## 2017-09-22 DIAGNOSIS — Z0130 Encounter for examination of blood pressure without abnormal findings: Secondary | ICD-10-CM

## 2017-09-22 NOTE — Progress Notes (Signed)
Pt arrived to [facility].  Pt alert and oriented  Last OV 08/25/2017 with M.Hairston.    She states she feels like her equilibrium is off. She has been off medication, Effexor x 6 days.  With the  3 pills she had left before her refill, she took one every other day and off medication for 3 days.    Pt denies chest pain, SOB, HA, dizziness, or blurred vision.  Verified medication with patient. Pt has medication in bag at bedside.   Manual blood pressure reading: 112/80

## 2017-10-17 ENCOUNTER — Ambulatory Visit: Payer: Self-pay | Attending: Family Medicine

## 2017-10-17 ENCOUNTER — Other Ambulatory Visit: Payer: Self-pay | Admitting: Family Medicine

## 2017-10-17 DIAGNOSIS — E782 Mixed hyperlipidemia: Secondary | ICD-10-CM

## 2017-10-17 MED FILL — ?ATORVASTATIN 20 MG TABLET: 20 | 30 days supply | Qty: 30 | Fill #0

## 2017-10-17 MED FILL — VENLAFAXINE HCL ER 150 MG C: 150 | 30 days supply | Qty: 30 | Fill #4

## 2017-10-23 ENCOUNTER — Telehealth: Payer: Self-pay | Admitting: Family Medicine

## 2017-10-23 DIAGNOSIS — F339 Major depressive disorder, recurrent, unspecified: Secondary | ICD-10-CM

## 2017-10-23 MED ORDER — VENLAFAXINE HCL ER 150 MG PO CP24: 150.0000 mg | ORAL_CAPSULE | ORAL | 0 refills | Status: DC

## 2017-10-23 NOTE — Telephone Encounter (Signed)
Refilled

## 2017-10-23 NOTE — Telephone Encounter (Signed)
Pt called to request a refill for venlafaxine XR (EFFEXOR-XR) 150 MG 24 hr capsule  Please sent it to [facility]

## 2017-11-20 MED FILL — VENLAFAXINE HCL ER 150 MG C: 150 | 30 days supply | Qty: 30 | Fill #5

## 2017-11-20 MED FILL — ?ATORVASTATIN 20 MG TABLET: 20 | 30 days supply | Qty: 30 | Fill #1

## 2017-11-26 ENCOUNTER — Ambulatory Visit: Payer: Self-pay | Admitting: Family Medicine

## 2017-11-27 ENCOUNTER — Ambulatory Visit: Payer: Self-pay | Admitting: Family Medicine

## 2017-12-08 ENCOUNTER — Ambulatory Visit: Payer: Self-pay | Admitting: Family Medicine

## 2018-01-06 ENCOUNTER — Emergency Department (HOSPITAL_COMMUNITY)
Admission: EM | Admit: 2018-01-06 | Discharge: 2018-01-06 | Disposition: A | Discharge status: Home / Self Care | Payer: Medicaid Other | Attending: Emergency Medicine | Admitting: Emergency Medicine

## 2018-01-06 ENCOUNTER — Encounter (HOSPITAL_COMMUNITY): Payer: Self-pay | Admitting: Emergency Medicine

## 2018-01-06 ENCOUNTER — Other Ambulatory Visit: Payer: Self-pay

## 2018-01-06 ENCOUNTER — Emergency Department (HOSPITAL_COMMUNITY): Payer: Medicaid Other

## 2018-01-06 DIAGNOSIS — H8110 Benign paroxysmal vertigo, unspecified ear: Secondary | ICD-10-CM | POA: Insufficient documentation

## 2018-01-06 DIAGNOSIS — Z79899 Other long term (current) drug therapy: Secondary | ICD-10-CM | POA: Insufficient documentation

## 2018-01-06 DIAGNOSIS — F17210 Nicotine dependence, cigarettes, uncomplicated: Secondary | ICD-10-CM | POA: Insufficient documentation

## 2018-01-06 LAB — URINALYSIS, ROUTINE W REFLEX MICROSCOPIC
Bilirubin Urine: NEGATIVE
GLUCOSE, UA: NEGATIVE mg/dL
Hgb urine dipstick: NEGATIVE
Ketones, ur: NEGATIVE mg/dL
LEUKOCYTES UA: NEGATIVE
NITRITE: NEGATIVE
PH: 6 (ref 5.0–8.0)
Protein, ur: NEGATIVE mg/dL
SPECIFIC GRAVITY, URINE: 1.011 (ref 1.005–1.030)

## 2018-01-06 LAB — BASIC METABOLIC PANEL
ANION GAP: 9 (ref 5–15)
BUN: 10 mg/dL (ref 6–20)
CHLORIDE: 106 mmol/L (ref 101–111)
CO2: 26 mmol/L (ref 22–32)
Calcium: 9.5 mg/dL (ref 8.9–10.3)
Creatinine, Ser: 0.63 mg/dL (ref 0.44–1.00)
GFR calc Af Amer: >60 mL/min (ref >60)
GFR calc non Af Amer: >60 mL/min (ref >60)
Glucose, Bld: 116 mg/dL — ABNORMAL HIGH (ref 65–99)
POTASSIUM: 3.6 mmol/L (ref 3.5–5.1)
SODIUM: 141 mmol/L (ref 135–145)

## 2018-01-06 LAB — CBG MONITORING, ED: Glucose-Capillary: 124 mg/dL — ABNORMAL HIGH (ref 65–99)

## 2018-01-06 LAB — CBC
HEMATOCRIT: 41.7 % (ref 36.0–46.0)
HEMOGLOBIN: 13.6 g/dL (ref 12.0–15.0)
MCH: 30.2 pg (ref 26.0–34.0)
MCHC: 32.6 g/dL (ref 30.0–36.0)
MCV: 92.5 fL (ref 78.0–100.0)
Platelets: 339 10*3/uL (ref 150–400)
RBC: 4.51 MIL/uL (ref 3.87–5.11)
RDW: 13.6 % (ref 11.5–15.5)
WBC: 8.5 10*3/uL (ref 4.0–10.5)

## 2018-01-06 LAB — I-STAT BETA HCG BLOOD, ED (MC, WL, AP ONLY)

## 2018-01-06 LAB — I-STAT TROPONIN, ED: Troponin i, poc: 0 ng/mL (ref 0.00–0.08)

## 2018-01-06 MED ORDER — MECLIZINE HCL 12.5 MG PO TABS: 12.5000 mg | ORAL_TABLET | Freq: Three times a day (TID) | ORAL | 0 refills | Status: DC | PRN

## 2018-01-06 MED ORDER — MECLIZINE HCL 25 MG PO TABS
25.0000 mg | ORAL_TABLET | Freq: Once | ORAL | Status: AC
  Administered 2018-01-06: 25 mg via ORAL
  Filled 2018-01-06: qty 1

## 2018-01-06 NOTE — ED Triage Notes (Signed)
Patient here from home with complains of headache today with a near syncopal episode. States that today became dizzy and had blurred vision when she try to lay flat. Denies nausea vomiting.

## 2018-01-06 NOTE — ED Provider Notes (Signed)
[facility] DEPT Provider Note   CSN: [id number] Arrival date & time: 01/06/18  0319     History   Chief Complaint Chief Complaint  Patient presents with  . Headache  . Near Syncope    HPI Lauren [name] is a 52 y.o. female.  The history is provided by the patient.  Dizziness  Quality:  Head spinning Severity:  Severe Onset quality:  Sudden Timing:  Intermittent Progression:  Unchanged Chronicity:  New Context: head movement   Relieved by:  Nothing Worsened by:  Nothing Ineffective treatments:  None tried Associated symptoms: nausea   Associated symptoms: no blood in stool, no chest pain, no diarrhea, no hearing loss, no palpitations, no shortness of breath, no syncope, no tinnitus, no vomiting and no weakness   Risk factors: no anemia and no hx of vertigo     Past Medical History:  Diagnosis Date  . Depression     Patient Active Problem List   Diagnosis Date Noted  . Mixed hyperlipidemia 06/19/2017  . GERD (gastroesophageal reflux disease) 04/15/2017  . OSA (obstructive sleep apnea) 04/15/2017  . Depression 11/28/2016    Past Surgical History:  Procedure Laterality Date  . ABDOMINAL HYSTERECTOMY    . BACK SURGERY    . CESAREAN SECTION    . HAND SURGERY      OB History    No data available       Home Medications    Prior to Admission medications   Medication Sig Start Date End Date Taking? Authorizing Provider  ibuprofen (ADVIL,MOTRIN) 200 MG tablet Take 600 mg by mouth every 6 (six) hours as needed for moderate pain.   Yes [provider]  venlafaxine XR (EFFEXOR-XR) 150 MG 24 hr capsule Take 1 capsule (150 mg total) by mouth every morning. 10/23/17  Yes Hairston, Maylon Peppers, FNP  acetaminophen (TYLENOL) 500 MG tablet Take 1 tablet (500 mg total) every 6 (six) hours as needed by mouth. 08/25/17   Alfonse Spruce, FNP  atorvastatin (LIPITOR) 20 MG tablet TAKE 1 TABLET BY MOUTH DAILY. 10/17/17   Alfonse Spruce, FNP  cephALEXin (KEFLEX) 500 MG capsule Take 1 capsule (500 mg total) 4 (four) times daily by mouth. Patient not taking: Reported on 09/22/2017 08/25/17   Alfonse Spruce, FNP  fluticasone (FLONASE) 50 MCG/ACT nasal spray Place 2 sprays into both nostrils daily. Twice a day for 7 days, then daily as needed. 11/28/16   Alfonse Spruce, FNP  hydrochlorothiazide (HYDRODIURIL) 25 MG tablet Take 1 tablet (25 mg total) by mouth daily. Patient not taking: Reported on 04/29/2017 04/15/17   Alfonse Spruce, FNP  omeprazole (PRILOSEC) 20 MG capsule Take 1 capsule (20 mg total) by mouth daily. Patient not taking: Reported on 09/22/2017 06/12/17   Alfonse Spruce, FNP    Family History No family history on file.  Social History Social History   Tobacco Use  . Smoking status: Current Every Day Smoker    Packs/day: 0.50    Types: Cigarettes  . Smokeless tobacco: Never Used  Substance Use Topics  . Alcohol use: No  . Drug use: No     Allergies   Patient has no known allergies.   Review of Systems Review of Systems  Constitutional: Negative for diaphoresis, fatigue and fever.  HENT: Negative for hearing loss and tinnitus.   Eyes: Negative for visual disturbance.  Respiratory: Negative for chest tightness and shortness of breath.   Cardiovascular: Negative for chest pain, palpitations  and syncope.  Gastrointestinal: Positive for nausea. Negative for blood in stool, diarrhea and vomiting.  Neurological: Positive for dizziness. Negative for tremors, seizures, syncope, facial asymmetry, speech difficulty, weakness, light-headedness and numbness.  All other systems reviewed and are negative.    Physical Exam Updated Vital Signs BP (!) 148/84   Pulse 97   Temp 98.1 F (36.7 C) (Oral)   Resp 16   Ht 5\' 2"  (1.575 m)   Wt 71.7 kg (158 lb)   SpO2 96%   BMI 28.90 kg/m   Physical Exam  Constitutional: She is oriented to person, place, and time. She appears  well-developed and well-nourished. No distress.  HENT:  Head: Normocephalic and atraumatic.  Right Ear: External ear normal.  Left Ear: External ear normal.  Nose: Nose normal.  Mouth/Throat: Oropharynx is clear and moist. No oropharyngeal exudate.  Eyes: Conjunctivae and EOM are normal. Pupils are equal, round, and reactive to light.  Neck: Normal range of motion. Neck supple.  Cardiovascular: Normal rate, regular rhythm, normal heart sounds and intact distal pulses.  Pulmonary/Chest: Effort normal. No stridor. She has no wheezes.  Abdominal: Soft. Bowel sounds are normal. There is no tenderness. There is no guarding.  Musculoskeletal: Normal range of motion.  Neurological: She is alert and oriented to person, place, and time. She displays normal reflexes. No cranial nerve deficit. She exhibits normal muscle tone. Coordination normal.  Skin: Skin is warm and dry. Capillary refill takes less than 2 seconds.  Psychiatric: She has a normal mood and affect.  Nursing note and vitals reviewed.    ED Treatments / Results  Labs (all labs ordered are listed, but only abnormal results are displayed) Results for orders placed or performed during the hospital encounter of 72/09/47  Basic metabolic panel  Result Value Ref Range   Sodium 141 135 - 145 mmol/L   Potassium 3.6 3.5 - 5.1 mmol/L   Chloride 106 101 - 111 mmol/L   CO2 26 22 - 32 mmol/L   Glucose, Bld 116 (H) 65 - 99 mg/dL   BUN 10 6 - 20 mg/dL   Creatinine, Ser 0.63 0.44 - 1.00 mg/dL   Calcium 9.5 8.9 - 10.3 mg/dL   GFR calc non Af Amer >60 >60 mL/min   GFR calc Af Amer >60 >60 mL/min   Anion gap 9 5 - 15  CBC  Result Value Ref Range   WBC 8.5 4.0 - 10.5 K/uL   RBC 4.51 3.87 - 5.11 MIL/uL   Hemoglobin 13.6 12.0 - 15.0 g/dL   HCT 41.7 36.0 - 46.0 %   MCV 92.5 78.0 - 100.0 fL   MCH 30.2 26.0 - 34.0 pg   MCHC 32.6 30.0 - 36.0 g/dL   RDW 13.6 11.5 - 15.5 %   Platelets 339 150 - 400 K/uL  Urinalysis, Routine w reflex  microscopic  Result Value Ref Range   Color, Urine YELLOW YELLOW   APPearance CLEAR CLEAR   Specific Gravity, Urine 1.011 1.005 - 1.030   pH 6.0 5.0 - 8.0   Glucose, UA NEGATIVE NEGATIVE mg/dL   Hgb urine dipstick NEGATIVE NEGATIVE   Bilirubin Urine NEGATIVE NEGATIVE   Ketones, ur NEGATIVE NEGATIVE mg/dL   Protein, ur NEGATIVE NEGATIVE mg/dL   Nitrite NEGATIVE NEGATIVE   Leukocytes, UA NEGATIVE NEGATIVE  CBG monitoring, ED  Result Value Ref Range   Glucose-Capillary 124 (H) 65 - 99 mg/dL   Comment 1 Notify RN   I-Stat beta hCG blood, ED  Result  Value Ref Range   I-stat hCG, quantitative <5.0 <5 mIU/mL   Comment 3          I-stat troponin, ED  Result Value Ref Range   Troponin i, poc 0.00 0.00 - 0.08 ng/mL   Comment 3           Ct Head Wo Contrast  Result Date: 01/06/2018 CLINICAL DATA:  Headache. Near syncopal episode. Dizziness and blurred vision. EXAM: CT HEAD WITHOUT CONTRAST TECHNIQUE: Contiguous axial images were obtained from the base of the skull through the vertex without intravenous contrast. COMPARISON:  None. FINDINGS: Brain: No intracranial hemorrhage, mass effect, or midline shift. No hydrocephalus. The basilar cisterns are patent. No evidence of territorial infarct or acute ischemia. Partially empty sella, unchanged from CT neck 09/22/2016 no extra-axial or intracranial fluid collection. Vascular: No hyperdense vessel or unexpected calcification. Skull: No fracture or focal lesion. Sinuses/Orbits: Small mucous retention cyst left ethmoid air cells. Other: None. IMPRESSION: No acute intracranial abnormality. Electronically Signed   By: Jeb Levering M.D.   On: 01/06/2018 04:44    EKG  Date: 01/06/2018  Rate: 93  Rhythm: normal sinus rhythm  QRS Axis: normal  Intervals: normal  ST/T Wave abnormalities: normal  Conduction Disutrbances: none  Narrative Interpretation: unremarkable     Radiology Ct Head Wo Contrast  Result Date: 01/06/2018 CLINICAL DATA:   Headache. Near syncopal episode. Dizziness and blurred vision. EXAM: CT HEAD WITHOUT CONTRAST TECHNIQUE: Contiguous axial images were obtained from the base of the skull through the vertex without intravenous contrast. COMPARISON:  None. FINDINGS: Brain: No intracranial hemorrhage, mass effect, or midline shift. No hydrocephalus. The basilar cisterns are patent. No evidence of territorial infarct or acute ischemia. Partially empty sella, unchanged from CT neck 09/22/2016 no extra-axial or intracranial fluid collection. Vascular: No hyperdense vessel or unexpected calcification. Skull: No fracture or focal lesion. Sinuses/Orbits: Small mucous retention cyst left ethmoid air cells. Other: None. IMPRESSION: No acute intracranial abnormality. Electronically Signed   By: Jeb Levering M.D.   On: 01/06/2018 04:44    Procedures Procedures (including critical care time)  Medications Ordered in ED Medications  meclizine (ANTIVERT) tablet 25 mg (not administered)      Final Clinical Impressions(s) / ED Diagnoses   Symptoms consistent with BPV.  Return for weakness, numbness, changes in vision or speech, fevers >100.4 unrelieved by medication, shortness of breath, intractable vomiting, or diarrhea, abdominal pain, Inability to tolerate liquids or food, cough, altered mental status or any concerns. No signs of systemic illness or infection. The patient is nontoxic-appearing on exam and vital signs are within normal limits.   I have reviewed the triage vital signs and the nursing notes. Pertinent labs &imaging results that were available during my care of the patient were reviewed by me and considered in my medical decision making (see chart for details).  After history, exam, and medical workup I feel the patient has been appropriately medically screened and is safe for discharge home. Pertinent diagnoses were discussed with the patient. Patient was given return precautions.    Lajuanna Pompa,  MD 01/06/18 0500

## 2018-01-07 ENCOUNTER — Ambulatory Visit: Payer: Self-pay | Attending: Nurse Practitioner | Admitting: Nurse Practitioner

## 2018-01-07 ENCOUNTER — Encounter: Payer: Self-pay | Admitting: Nurse Practitioner

## 2018-01-07 VITALS — BP 151/90 | HR 77 | Temp 98.9°F | Ht 62.0 in | Wt 161.6 lb

## 2018-01-07 DIAGNOSIS — Z833 Family history of diabetes mellitus: Secondary | ICD-10-CM | POA: Insufficient documentation

## 2018-01-07 DIAGNOSIS — K210 Gastro-esophageal reflux disease with esophagitis: Secondary | ICD-10-CM | POA: Insufficient documentation

## 2018-01-07 DIAGNOSIS — R55 Syncope and collapse: Secondary | ICD-10-CM | POA: Insufficient documentation

## 2018-01-07 DIAGNOSIS — Z79899 Other long term (current) drug therapy: Secondary | ICD-10-CM | POA: Insufficient documentation

## 2018-01-07 DIAGNOSIS — F329 Major depressive disorder, single episode, unspecified: Secondary | ICD-10-CM | POA: Insufficient documentation

## 2018-01-07 DIAGNOSIS — Z791 Long term (current) use of non-steroidal anti-inflammatories (NSAID): Secondary | ICD-10-CM | POA: Insufficient documentation

## 2018-01-07 DIAGNOSIS — Z90710 Acquired absence of both cervix and uterus: Secondary | ICD-10-CM | POA: Insufficient documentation

## 2018-01-07 DIAGNOSIS — G4733 Obstructive sleep apnea (adult) (pediatric): Secondary | ICD-10-CM | POA: Insufficient documentation

## 2018-01-07 DIAGNOSIS — Z760 Encounter for issue of repeat prescription: Secondary | ICD-10-CM | POA: Insufficient documentation

## 2018-01-07 DIAGNOSIS — E782 Mixed hyperlipidemia: Secondary | ICD-10-CM | POA: Insufficient documentation

## 2018-01-07 DIAGNOSIS — H8110 Benign paroxysmal vertigo, unspecified ear: Secondary | ICD-10-CM | POA: Insufficient documentation

## 2018-01-07 DIAGNOSIS — Z9889 Other specified postprocedural states: Secondary | ICD-10-CM | POA: Insufficient documentation

## 2018-01-07 DIAGNOSIS — Z09 Encounter for follow-up examination after completed treatment for conditions other than malignant neoplasm: Secondary | ICD-10-CM | POA: Insufficient documentation

## 2018-01-07 DIAGNOSIS — Z9989 Dependence on other enabling machines and devices: Secondary | ICD-10-CM

## 2018-01-07 DIAGNOSIS — K219 Gastro-esophageal reflux disease without esophagitis: Secondary | ICD-10-CM

## 2018-01-07 NOTE — Progress Notes (Signed)
Assessment & Plan:  Lauren [name] was seen today for hospitalization follow-up and medication refill.  Diagnoses and all orders for this visit:  Benign paroxysmal positional vertigo, unspecified laterality Continue meclizine as prescribed.   Mixed hyperlipidemia Please call patient: Tests show increased cholesterol/lipid levels. Will need to start Work on a low fat, heart healthy diet and participate in regular aerobic exercise program to control as well by working out at least 150 minutes per week. No fried foods. No junk foods, sodas, sugary drinks, unhealthy snacking, or smoking.  Take Omega 3 (2)capsules daily.   Gastroesophageal reflux disease, esophagitis presence not specified INSTRUCTIONS: Avoid GERD Triggers: acidic, spicy or fried foods, caffeine, coffee, sodas,  alcohol and chocolate.   Major depressive disorder, remission status unspecified, unspecified whether recurrent Continue Effexor as prescribed.    OSA on CPAP She endorses increased bloating sensation after using her CPAP every night. She believes her settings need to be changed due to weight loss.  -     Cpap titration; Future  Patient has been counseled on age-appropriate routine health concerns for screening and prevention. These are reviewed and up-to-date. Referrals have been placed accordingly. Immunizations are up-to-date or declined.    Subjective:   Chief Complaint  Patient presents with  . Hospitalization Follow-up    Pt is here for hospital follow-up on dizziness. Patient takes Darmamine 25mg  OTC to help her dizziness.   . Medication Refill   HPI Lauren [name] 52 y.o. female presents to office today to establish care. She has a history of Depression, HPL, GERD, OSA.   Dizziness Acute, Improved. Admitted to the ED on just yesterday with complaints of dizziness/near syncope. Work up was essentially negative and she was prescribed meclizine and diagnosed with BPV. Today she reports she could not afford the  meclizine and will not be able to pick up until Friday. She is currently taking dramamine which provides adequate relief of her symptoms.  She does report a history of 3 recurrent syncopal episodes 6 years ago. She was worked up extensively with no causal effect found. She may need a cardiology work up in the near future. She does need to speak to our Development worker, community.   Hyperlipidemia Patient presents for follow up to hyperlipidemia. She has not been taking her Lipitor. She states she was not aware that she was started on a statin.  She  denies chest pain, dyspnea, lower extremity edema and tachypnea. She was instructed today to start taking Omega 3 fish oil. Will recheck lipids at next office visit to assess for need of statin.   Lab Results  Component Value Date   CHOL 205 (H) 06/12/2017   Lab Results  Component Value Date   HDL 57 06/12/2017   Lab Results  Component Value Date   LDLCALC 135 (H) 06/12/2017   Lab Results  Component Value Date   TRIG 66 06/12/2017   Lab Results  Component Value Date   CHOLHDL 3.6 06/12/2017   GERD Taking omeprazole as needed. Provides significant relief of symptoms however she does not take every day.   Depression Chronic. Taking Effexor XR 150mg  daily which provides significant relief of her depression symptoms. She denies suicidal ideation.    Review of Systems  Constitutional: Negative for fever, malaise/fatigue and weight loss.  HENT: Negative.  Negative for nosebleeds.   Eyes: Negative.  Negative for blurred vision, double vision and photophobia.  Respiratory: Negative.  Negative for cough and shortness of breath.   Cardiovascular: Negative.  Negative for chest pain, palpitations and leg swelling.  Gastrointestinal: Positive for heartburn. Negative for nausea and vomiting.  Musculoskeletal: Negative.  Negative for myalgias.  Neurological: Positive for dizziness. Negative for focal weakness, seizures and headaches.    Psychiatric/Behavioral: Positive for depression. Negative for suicidal ideas.    Past Medical History:  Diagnosis Date  . Depression     Past Surgical History:  Procedure Laterality Date  . ABDOMINAL HYSTERECTOMY    . BACK SURGERY    . CESAREAN SECTION    . HAND SURGERY    . NECK SURGERY  2002   plate in her neck    Family History  Problem Relation Age of Onset  . Diabetes Father     Social History Reviewed with no changes to be made today.   Outpatient Medications Prior to Visit  Medication Sig Dispense Refill  . DimenhyDRINATE (DRAMAMINE PO) Take 25 mg by mouth.    . venlafaxine XR (EFFEXOR-XR) 150 MG 24 hr capsule Take 1 capsule (150 mg total) by mouth every morning. 90 capsule 0  . acetaminophen (TYLENOL) 500 MG tablet Take 1 tablet (500 mg total) every 6 (six) hours as needed by mouth. (Patient not taking: Reported on 01/06/2018) 30 tablet 0  . ibuprofen (ADVIL,MOTRIN) 200 MG tablet Take 600 mg by mouth every 6 (six) hours as needed for moderate pain.    . meclizine (ANTIVERT) 12.5 MG tablet Take 1 tablet (12.5 mg total) by mouth 3 (three) times daily as needed for dizziness. (Patient not taking: Reported on 01/07/2018) 12 tablet 0  . omeprazole (PRILOSEC) 20 MG capsule Take 1 capsule (20 mg total) by mouth daily. (Patient not taking: Reported on 09/22/2017) 30 capsule 3  . atorvastatin (LIPITOR) 20 MG tablet TAKE 1 TABLET BY MOUTH DAILY. (Patient not taking: Reported on 01/06/2018) 30 tablet 2  . cephALEXin (KEFLEX) 500 MG capsule Take 1 capsule (500 mg total) 4 (four) times daily by mouth. (Patient not taking: Reported on 09/22/2017) 20 capsule 0  . fluticasone (FLONASE) 50 MCG/ACT nasal spray Place 2 sprays into both nostrils daily. Twice a day for 7 days, then daily as needed. (Patient not taking: Reported on 01/06/2018) 16 g 0  . hydrochlorothiazide (HYDRODIURIL) 25 MG tablet Take 1 tablet (25 mg total) by mouth daily. (Patient not taking: Reported on 04/29/2017) 30 tablet 2    No facility-administered medications prior to visit.     No Known Allergies     Objective:    BP (!) 151/90 (BP Location: Left Arm, Patient Position: Sitting, Cuff Size: Normal)   Pulse 77   Temp 98.9 F (37.2 C) (Oral)   Ht 5\' 2"  (1.575 m)   Wt 161 lb 9.6 oz (73.3 kg)   SpO2 99%   BMI 29.56 kg/m  Wt Readings from Last 3 Encounters:  01/07/18 161 lb 9.6 oz (73.3 kg)  01/06/18 158 lb (71.7 kg)  08/25/17 161 lb 9.6 oz (73.3 kg)    Physical Exam  Constitutional: She is oriented to person, place, and time. She appears well-developed and well-nourished. She is cooperative.  HENT:  Head: Normocephalic and atraumatic.  Eyes: EOM are normal.  Neck: Normal range of motion.  Cardiovascular: Normal rate, regular rhythm and normal heart sounds. Exam reveals no gallop and no friction rub.  No murmur heard. Pulmonary/Chest: Effort normal and breath sounds normal. No tachypnea. No respiratory distress. She has no decreased breath sounds. She has no wheezes. She has no rhonchi. She has no rales. She exhibits  no tenderness.  Abdominal: Soft. Bowel sounds are normal.  Musculoskeletal: Normal range of motion. She exhibits no edema.  Neurological: She is alert and oriented to person, place, and time. She has normal strength. Coordination and gait normal.  Skin: Skin is warm and dry.  Psychiatric: She has a normal mood and affect. Her behavior is normal. Judgment and thought content normal.  Nursing note and vitals reviewed.      Patient has been counseled extensively about nutrition and exercise as well as the importance of adherence with medications and regular follow-up. The patient was given clear instructions to go to ER or return to medical center if symptoms don't improve, worsen or new problems develop. The patient verbalized understanding.   Follow-up: Return in about 6 months (around 07/10/2018).   Gildardo Pounds, FNP-BC [facility] and [place], [place]   01/07/2018, 12:56 PM

## 2018-01-07 NOTE — Patient Instructions (Addendum)

## 2018-01-12 MED FILL — VENLAFAXINE HCL ER 150 MG C: 150 | 30 days supply | Qty: 30 | Fill #0

## 2018-01-13 ENCOUNTER — Telehealth: Payer: Self-pay | Admitting: Nurse Practitioner

## 2018-01-13 NOTE — Telephone Encounter (Signed)
Will route to PCP 

## 2018-01-13 NOTE — Telephone Encounter (Signed)
Sleep center called and asked for a split night to be order because they don't have the original sleep study. Please call Terri back [phone]

## 2018-01-15 ENCOUNTER — Other Ambulatory Visit: Payer: Self-pay | Admitting: Nurse Practitioner

## 2018-01-15 DIAGNOSIS — Z9189 Other specified personal risk factors, not elsewhere classified: Secondary | ICD-10-CM

## 2018-01-15 NOTE — Telephone Encounter (Signed)
New order has been placed. 

## 2018-02-11 MED FILL — VENLAFAXINE HCL ER 150 MG C: 150 | 30 days supply | Qty: 30 | Fill #1

## 2018-02-13 ENCOUNTER — Other Ambulatory Visit: Payer: Self-pay | Admitting: Obstetrics and Gynecology

## 2018-02-13 ENCOUNTER — Ambulatory Visit (HOSPITAL_BASED_OUTPATIENT_CLINIC_OR_DEPARTMENT_OTHER): Payer: Self-pay | Attending: Nurse Practitioner | Admitting: Internal Medicine

## 2018-02-13 VITALS — Ht 62.0 in | Wt 150.0 lb

## 2018-02-13 DIAGNOSIS — G4733 Obstructive sleep apnea (adult) (pediatric): Secondary | ICD-10-CM | POA: Insufficient documentation

## 2018-02-13 DIAGNOSIS — G4730 Sleep apnea, unspecified: Secondary | ICD-10-CM

## 2018-02-13 DIAGNOSIS — Z1231 Encounter for screening mammogram for malignant neoplasm of breast: Secondary | ICD-10-CM

## 2018-02-13 DIAGNOSIS — Z9189 Other specified personal risk factors, not elsewhere classified: Secondary | ICD-10-CM

## 2018-02-22 DIAGNOSIS — G4730 Sleep apnea, unspecified: Secondary | ICD-10-CM

## 2018-02-22 NOTE — Procedures (Signed)
Patient Name: Lauren [name], Lauren [name] Date: 02/13/2018 Gender: Female D.O.B: [birth year] Age (years): 60 Referring Provider: Gildardo Pounds NP Height (inches): 31 Interpreting Physician: Baird Lyons MD, ABSM Weight (lbs): 150 RPSGT: Baxter Flattery BMI: 27 MRN: [id number] Neck Size: 15.00 <br> <br> CLINICAL INFORMATION Sleep Study Type: NPSG Indication for sleep study: Snoring, Witnesses Apnea / Gasping During Sleep  Epworth Sleepiness Score: 2  SLEEP STUDY TECHNIQUE As per the AASM Manual for the Scoring of Sleep and Associated Events v2.3 (April 2016) with a hypopnea requiring 4% desaturations.  The channels recorded and monitored were frontal, central and occipital EEG, electrooculogram (EOG), submentalis EMG (chin), nasal and oral airflow, thoracic and abdominal wall motion, anterior tibialis EMG, snore microphone, electrocardiogram, and pulse oximetry.  MEDICATIONS Medications self-administered by patient taken the night of the study : none reported  SLEEP ARCHITECTURE The study was initiated at 9:58:44 PM and ended at 4:33:37 AM.  Sleep onset time was 15.9 minutes and the sleep efficiency was 82.8%%. The total sleep time was 327 minutes.  Stage REM latency was 46.0 minutes.  The patient spent 3.7%% of the night in stage N1 sleep, 40.8%% in stage N2 sleep, 0.0%% in stage N3 and 55.50% in REM.  Alpha intrusion was absent.  Supine sleep was 19.24%.  RESPIRATORY PARAMETERS The overall apnea/hypopnea index (AHI) was 5.7 per hour. There were 19 total apneas, including 6 obstructive, 13 central and 0 mixed apneas. There were 12 hypopneas and 5 RERAs.  The AHI during Stage REM sleep was 3.0 per hour.  AHI while supine was 18.1 per hour.  The mean oxygen saturation was 95.2%. The minimum SpO2 during sleep was 87.0%.  moderate snoring was noted during this study.  CARDIAC DATA The 2 lead EKG demonstrated sinus rhythm. The mean heart rate was 75.7 beats per minute.  Other EKG findings include: None.  LEG MOVEMENT DATA The total PLMS were 0 with a resulting PLMS index of 0.0. Associated arousal with leg movement index was 0.0 .  IMPRESSIONS - Minimal obstructive sleep apnea occurred during this study (AHI = 5.7/h). - No significant central sleep apnea occurred during this study (CAI = 2.4/h). - Mild oxygen desaturation was noted during this study (Min O2 = 87.0%). - The patient snored with moderate snoring volume. - No cardiac abnormalities were noted during this study. - Clinically significant periodic limb movements did not occur during sleep. No significant associated arousals.  DIAGNOSIS - Obstructive Sleep Apnea (327.23 [G47.33 ICD-10])  RECOMMENDATIONS - Treatment for very mild OSA is directed by symptoms, and may not be needed. Conservative measures such as normalizing weight and sleep off flat of back may be sufficient. Consider a chin strap or oral appliance. Other measures would be based on clinical judgment. - Positional therapy avoiding supine position during sleep. - Be careful with alcohol, sedatives and other CNS depressants that may worsen sleep apnea and disrupt normal sleep architecture. - Sleep hygiene should be reviewed to assess factors that may improve sleep quality. - Weight management and regular exercise should be initiated or continued if appropriate.  [Electronically signed] 02/22/2018 10:12 AM  Baird Lyons MD, ABSM Diplomate, American Board of Sleep Medicine   NPI: [id number]                        [place] [place] [facility]  ELECTRONICALLY SIGNED ON:  02/22/2018, 10:13 AM [place] PH: [phone]   FX: [phone]  [place] [facility]

## 2018-03-12 ENCOUNTER — Ambulatory Visit (HOSPITAL_COMMUNITY)
Admission: RE | Admit: 2018-03-12 | Discharge: 2018-03-12 | Disposition: A | Discharge status: Home / Self Care | Payer: Self-pay | Source: Ambulatory Visit | Attending: Obstetrics and Gynecology | Admitting: Obstetrics and Gynecology

## 2018-03-12 ENCOUNTER — Ambulatory Visit
Admission: RE | Admit: 2018-03-12 | Discharge: 2018-03-12 | Disposition: A | Discharge status: Home / Self Care | Payer: No Typology Code available for payment source | Source: Ambulatory Visit | Attending: Obstetrics and Gynecology | Admitting: Obstetrics and Gynecology

## 2018-03-12 ENCOUNTER — Encounter (HOSPITAL_COMMUNITY): Payer: Self-pay

## 2018-03-12 VITALS — BP 145/87 | Ht 62.0 in | Wt 154.8 lb

## 2018-03-12 DIAGNOSIS — Z1239 Encounter for other screening for malignant neoplasm of breast: Secondary | ICD-10-CM

## 2018-03-12 DIAGNOSIS — Z1231 Encounter for screening mammogram for malignant neoplasm of breast: Secondary | ICD-10-CM

## 2018-03-12 NOTE — Progress Notes (Signed)
No complaints today.   Pap Smear: Pap smear not completed today. Last Pap smear was in 2016 and normal per patient. Per patient has no history of an abnormal Pap smear. Patient has a history of a hysterectomy in 2008 due to AUB. Patient doesn't need any further Pap smears due to her history of a hysterectomy for benign reasons per BCCCP and ACOG guidelines. No Pap smear results are in Epic.  Physical exam: Breasts Breasts symmetrical. No skin abnormalities bilateral breasts. No nipple retraction bilateral breasts. No nipple discharge bilateral breasts. No lymphadenopathy. No lumps palpated bilateral breasts. No complaints of pain or tenderness on exam. Referred patient to the [facility] for a screening mammogram. Appointment scheduled for Thursday, Mar 12, 2018 at 0850.        Pelvic/Bimanual No Pap smear completed today since patient has a history of a hysterectomy for benign reasons. Pap smear not indicated per BCCCP guidelines.   Smoking History: Patient is a current smoker. Discussed smoking cessation with patient. Referred to the [facility] Quitline and gave resources to the free smoking cessation classes at [facility].  Patient Navigation: Patient education provided. Access to services provided for patient through [facility] program.   Colorectal Cancer Screening: Per patient has never had a colonoscopy completed. No complaints today. FIT Test given to patient to complete and return to BCCCP.  Breast and Cervical Cancer Risk Assessment: Patient has no family history of breast cancer, known genetic mutations, or radiation treatment to the chest before age 89. Patient has no history of cervical dysplasia, immunocompromised, or DES exposure in-utero. Patient has a 5-year risk of breast cancer at 1.0% and a lifetime risk at 8.6%.

## 2018-03-12 NOTE — Patient Instructions (Signed)
Explained breast self awareness with Lauren [name]. Patient did not need a Pap smear today due to patient has a history of a hysterectomy for benign reasons. Let patient know that she doesn't need any further Pap smears due to her history of a hysterectomy for benign reasons. Referred patient to the [facility] for a screening mammogram. Appointment scheduled for Thursday, Mar 12, 2018 at 0850. Let [facility] know [facility] will follow up with her within the next couple weeks with results of mammogram by letter or phone. Discussed smoking cessation with patient. Referred to the [facility] Quitline and gave resources to the free smoking cessation classes at [facility]. Lauren [name] verbalized understanding.  Findlay Dagher, Arvil Chaco, RN 8:53 AM

## 2018-03-13 MED FILL — VENLAFAXINE HCL ER 150 MG C: 150 | 30 days supply | Qty: 30 | Fill #2

## 2018-03-18 ENCOUNTER — Other Ambulatory Visit: Payer: Self-pay

## 2018-03-21 LAB — FECAL OCCULT BLOOD, IMMUNOCHEMICAL: Fecal Occult Bld: NEGATIVE

## 2018-03-21 LAB — SPECIMEN STATUS REPORT

## 2018-03-23 ENCOUNTER — Encounter (HOSPITAL_COMMUNITY): Payer: Self-pay

## 2018-03-31 ENCOUNTER — Encounter (HOSPITAL_COMMUNITY): Payer: Self-pay | Admitting: *Deleted

## 2018-04-17 ENCOUNTER — Other Ambulatory Visit: Payer: Self-pay

## 2018-04-17 DIAGNOSIS — F339 Major depressive disorder, recurrent, unspecified: Secondary | ICD-10-CM

## 2018-04-17 MED ORDER — VENLAFAXINE HCL ER 150 MG PO CP24: 150.0000 mg | ORAL_CAPSULE | ORAL | 0 refills | Status: DC

## 2018-04-17 MED FILL — VENLAFAXINE HCL ER 150 MG C: 150 | 30 days supply | Qty: 30 | Fill #0

## 2018-04-20 ENCOUNTER — Other Ambulatory Visit: Payer: Self-pay

## 2018-04-20 DIAGNOSIS — F339 Major depressive disorder, recurrent, unspecified: Secondary | ICD-10-CM

## 2018-05-04 ENCOUNTER — Ambulatory Visit: Payer: Self-pay | Attending: Nurse Practitioner | Admitting: Nurse Practitioner

## 2018-05-04 ENCOUNTER — Encounter: Payer: Self-pay | Admitting: Nurse Practitioner

## 2018-05-04 VITALS — BP 151/84 | HR 68 | Temp 98.4°F | Ht 62.0 in | Wt 153.2 lb

## 2018-05-04 DIAGNOSIS — R7303 Prediabetes: Secondary | ICD-10-CM | POA: Insufficient documentation

## 2018-05-04 DIAGNOSIS — F339 Major depressive disorder, recurrent, unspecified: Secondary | ICD-10-CM | POA: Insufficient documentation

## 2018-05-04 DIAGNOSIS — M549 Dorsalgia, unspecified: Secondary | ICD-10-CM | POA: Insufficient documentation

## 2018-05-04 DIAGNOSIS — R030 Elevated blood-pressure reading, without diagnosis of hypertension: Secondary | ICD-10-CM | POA: Insufficient documentation

## 2018-05-04 DIAGNOSIS — R399 Unspecified symptoms and signs involving the genitourinary system: Secondary | ICD-10-CM

## 2018-05-04 DIAGNOSIS — G4733 Obstructive sleep apnea (adult) (pediatric): Secondary | ICD-10-CM | POA: Insufficient documentation

## 2018-05-04 LAB — POCT URINALYSIS DIPSTICK
Bilirubin, UA: NEGATIVE
Blood, UA: NEGATIVE
Glucose, UA: NEGATIVE
LEUKOCYTES UA: NEGATIVE
NITRITE UA: NEGATIVE
PH UA: 6 (ref 5.0–8.0)
PROTEIN UA: NEGATIVE
Spec Grav, UA: 1.02 (ref 1.010–1.025)
UROBILINOGEN UA: 1 U/dL

## 2018-05-04 MED ORDER — VENLAFAXINE HCL ER 150 MG PO CP24: 150.0000 mg | ORAL_CAPSULE | ORAL | 1 refills | Status: DC

## 2018-05-04 NOTE — Progress Notes (Signed)
Assessment & Plan:  Lauren [name] was seen today for back pain.  Diagnoses and all orders for this visit:  UTI symptoms -     Urinalysis Dipstick  Prediabetes -     Hemoglobin A1c Continue blood sugar control as discussed in office today, low carbohydrate diet, and regular physical exercise as tolerated, 150 minutes per week (30 min each day, 5 days per week, or 50 min 3 days per week). Annual eye exams and foot exams are recommended.  OSA (obstructive sleep apnea) -     Cpap titration; Future She is requesting to have the settings of her current CPAP titrated and will need CPAP titration study. Most recent sleep study showed mild OSA with conservative measures recommended. However she continues to use her former CPAP with the initial prescribed settings that she feels now need to be readjusted.   Recurrent major depressive disorder, remission status unspecified (HCC) -     venlafaxine XR (EFFEXOR-XR) 150 MG 24 hr capsule; Take 1 capsule (150 mg total) by mouth every morning. Stable and well controlled. Denies any suicidal ideation.     Patient has been counseled on age-appropriate routine health concerns for screening and prevention. These are reviewed and up-to-date. Referrals have been placed accordingly. Immunizations are up-to-date or declined.    Subjective:   Chief Complaint  Patient presents with  . Back Pain    Pt. stated have back pain and abdominal pain.    HPI Lauren [name] 52 y.o. female presents to office today with complaints of UTI symptoms.   UTI symptoms Onset of abdominal pain was one month ago. She endorses increased frequency, left flank pain. Patient also complains of back pain. Patient denies fever, stomach ache and vaginal discharge. Patient does not have a history of recurrent UTI.  Patient does not have a history of pyelonephritis.   Elevated Blood Pressure She denies a previous history of HTN. Endorses increased stress. Will continue to monitor. If  continues elevated at next office visit with need to start antihypertensive. Patient verbalized understanding.  BP Readings from Last 3 Encounters:  05/04/18 (!) 151/84  03/12/18 (!) 145/87  01/07/18 (!) 151/90   Prediabetes Chronic. Stable. Diet controlled at this time. Repeat A1c pending.  Lab Results  Component Value Date   HGBA1C 5.9 08/25/2017    Review of Systems  Constitutional: Negative for fever, malaise/fatigue and weight loss.  HENT: Negative.  Negative for nosebleeds.   Eyes: Negative.  Negative for blurred vision, double vision and photophobia.  Respiratory: Negative.  Negative for cough and shortness of breath.   Cardiovascular: Negative.  Negative for chest pain, palpitations and leg swelling.  Gastrointestinal: Negative.  Negative for heartburn, nausea and vomiting.  Genitourinary: Positive for frequency and urgency. Negative for dysuria, flank pain and hematuria.       SEE HPI  Musculoskeletal: Negative.  Negative for myalgias.  Neurological: Negative.  Negative for dizziness, focal weakness, seizures and headaches.  Psychiatric/Behavioral: Positive for depression. Negative for suicidal ideas.    Past Medical History:  Diagnosis Date  . Depression   . Prediabetes     Past Surgical History:  Procedure Laterality Date  . ABDOMINAL HYSTERECTOMY    . BACK SURGERY    . CESAREAN SECTION    . HAND SURGERY    . NECK SURGERY  2002   plate in her neck    Family History  Problem Relation Age of Onset  . Diabetes Father     Social History Reviewed with  no changes to be made today.   Outpatient Medications Prior to Visit  Medication Sig Dispense Refill  . ibuprofen (ADVIL,MOTRIN) 200 MG tablet Take 600 mg by mouth every 6 (six) hours as needed for moderate pain.    Marland Kitchen venlafaxine XR (EFFEXOR-XR) 150 MG 24 hr capsule Take 1 capsule (150 mg total) by mouth every morning. 90 capsule 0  . acetaminophen (TYLENOL) 500 MG tablet Take 1 tablet (500 mg total) every 6  (six) hours as needed by mouth. (Patient not taking: Reported on 01/06/2018) 30 tablet 0  . DimenhyDRINATE (DRAMAMINE PO) Take 25 mg by mouth.    Marland Kitchen omeprazole (PRILOSEC) 20 MG capsule Take 1 capsule (20 mg total) by mouth daily. (Patient not taking: Reported on 09/22/2017) 30 capsule 3  . meclizine (ANTIVERT) 12.5 MG tablet Take 1 tablet (12.5 mg total) by mouth 3 (three) times daily as needed for dizziness. (Patient not taking: Reported on 01/07/2018) 12 tablet 0   No facility-administered medications prior to visit.     No Known Allergies     Objective:    BP (!) 151/84 (BP Location: Right Arm, Patient Position: Sitting, Cuff Size: Large)   Pulse 68   Temp 98.4 F (36.9 C) (Oral)   Ht 5\' 2"  (1.575 m)   Wt 153 lb 3.2 oz (69.5 kg)   SpO2 100%   BMI 28.02 kg/m  Wt Readings from Last 3 Encounters:  05/04/18 153 lb 3.2 oz (69.5 kg)  03/12/18 154 lb 12.8 oz (70.2 kg)  02/13/18 150 lb (68 kg)    Physical Exam  Constitutional: She is oriented to person, place, and time. She appears well-developed and well-nourished. She is cooperative.  HENT:  Head: Normocephalic and atraumatic.  Eyes: EOM are normal.  Neck: Normal range of motion.  Cardiovascular: Normal rate, regular rhythm and normal heart sounds. Exam reveals no gallop and no friction rub.  No murmur heard. Pulmonary/Chest: Effort normal and breath sounds normal. No tachypnea. No respiratory distress. She has no decreased breath sounds. She has no wheezes. She has no rhonchi. She has no rales. She exhibits no tenderness.  Abdominal: Bowel sounds are normal. She exhibits no distension. There is no tenderness. There is no guarding and no CVA tenderness.  Musculoskeletal: Normal range of motion. She exhibits no edema.  Neurological: She is alert and oriented to person, place, and time. Coordination normal.  Skin: Skin is warm and dry.  Psychiatric: She has a normal mood and affect. Her behavior is normal. Judgment and thought content  normal.  Nursing note and vitals reviewed.      Patient has been counseled extensively about nutrition and exercise as well as the importance of adherence with medications and regular follow-up. The patient was given clear instructions to go to ER or return to medical center if symptoms don't improve, worsen or new problems develop. The patient verbalized understanding.   Follow-up: No follow-ups on file.   Gildardo Pounds, FNP-BC [facility] and [facility], [place]   05/04/2018, 6:19 PM

## 2018-05-04 NOTE — Patient Instructions (Signed)
Prediabetes Prediabetes is the condition of having a blood sugar (blood glucose) level that is higher than it should be, but not high enough for you to be diagnosed with type 2 diabetes. Having prediabetes puts you at risk for developing type 2 diabetes (type 2 diabetes mellitus). Prediabetes may be called impaired glucose tolerance or impaired fasting glucose. Prediabetes usually does not cause symptoms. Your health care provider can diagnose this condition with blood tests. You may be tested for prediabetes if you are overweight and if you have at least one other risk factor for prediabetes. Risk factors for prediabetes include:  Having a family member with type 2 diabetes.  Being overweight or obese.  Being older than age 57.  Being of American-Indian, African-American, Hispanic/Latino, or Asian/Pacific Islander descent.  Having an inactive (sedentary) lifestyle.  Having a history of gestational diabetes or polycystic ovarian syndrome (PCOS).  Having low levels of good cholesterol (HDL-C) or high levels of blood fats (triglycerides).  Having high blood pressure.  What is blood glucose and how is blood glucose measured?  Blood glucose refers to the amount of glucose in your bloodstream. Glucose comes from eating foods that contain sugars and starches (carbohydrates) that the body breaks down into glucose. Your blood glucose level may be measured in mg/dL (milligrams per deciliter) or mmol/L (millimoles per liter).Your blood glucose may be checked with one or more of the following blood tests:  A fasting blood glucose (FBG) test. You will not be allowed to eat (you will fast) for at least 8 hours before a blood sample is taken. ? A normal range for FBG is 70-100 mg/dl (3.9-5.6 mmol/L).  An A1c (hemoglobin A1c) blood test. This test provides information about blood glucose control over the previous 2?68month.  An oral glucose tolerance test (OGTT). This test measures your blood  glucose twice: ? After fasting. This is your baseline level. ? Two hours after you drink a beverage that contains glucose.  You may be diagnosed with prediabetes:  If your FBG is 100?125 mg/dL (5.6-6.9 mmol/L).  If your A1c level is 5.7?6.4%.  If your OGGT result is 140?199 mg/dL (7.8-11 mmol/L).  These blood tests may be repeated to confirm your diagnosis. What happens if blood glucose is too high? The pancreas produces a hormone (insulin) that helps move glucose from the bloodstream into cells. When cells in the body do not respond properly to insulin that the body makes (insulin resistance), excess glucose builds up in the blood instead of going into cells. As a result, high blood glucose (hyperglycemia) can develop, which can cause many complications. This is a symptom of prediabetes. What can happen if blood glucose stays higher than normal for a long time? Having high blood glucose for a long time is dangerous. Too much glucose in your blood can damage your nerves and blood vessels. Long-term damage can lead to complications from diabetes, which may include:  Heart disease.  Stroke.  Blindness.  Kidney disease.  Depression.  Poor circulation in the feet and legs, which could lead to surgical removal (amputation) in severe cases.  How can prediabetes be prevented from turning into type 2 diabetes?  To help prevent type 2 diabetes, take the following actions:  Be physically active. ? Do moderate-intensity physical activity for at least 30 minutes on at least 5 days of the week, or as much as told by your health care provider. This could be brisk walking, biking, or water aerobics. ? Ask your health care provider what  activities are safe for you. A mix of physical activities may be best, such as walking, swimming, cycling, and strength training.  Lose weight as told by your health care provider. ? Losing 5-7% of your body weight can reverse insulin resistance. ? Your health  care provider can determine how much weight loss is best for you and can help you lose weight safely.  Follow a healthy meal plan. This includes eating lean proteins, complex carbohydrates, fresh fruits and vegetables, low-fat dairy products, and healthy fats. ? Follow instructions from your health care provider about eating or drinking restrictions. ? Make an appointment to see a diet and nutrition specialist (registered dietitian) to help you create a healthy eating plan that is right for you.  Do not smoke or use any tobacco products, such as cigarettes, chewing tobacco, and e-cigarettes. If you need help quitting, ask your health care provider.  Take over-the-counter and prescription medicines as told by your health care provider. You may be prescribed medicines that help lower the risk of type 2 diabetes.  This information is not intended to replace advice given to you by your health care provider. Make sure you discuss any questions you have with your health care provider. Document Released: 01/29/2016 Document Revised: 03/14/2016 Document Reviewed: 11/28/2015 Elsevier Interactive Patient Education  2018 [employer]. Prediabetes Eating Plan Prediabetes-also called impaired glucose tolerance or impaired fasting glucose-is a condition that causes blood sugar (blood glucose) levels to be higher than normal. Following a healthy diet can help to keep prediabetes under control. It can also help to lower the risk of type 2 diabetes and heart disease, which are increased in people who have prediabetes. Along with regular exercise, a healthy diet:  Promotes weight loss.  Helps to control blood sugar levels.  Helps to improve the way that the body uses insulin.  What do I need to know about this eating plan?  Use the glycemic index (GI) to plan your meals. The index tells you how quickly a food will raise your blood sugar. Choose low-GI foods. These foods take a longer time to raise blood  sugar.  Pay close attention to the amount of carbohydrates in the food that you eat. Carbohydrates increase blood sugar levels.  Keep track of how many calories you take in. Eating the right amount of calories will help you to achieve a healthy weight. Losing about 7 percent of your starting weight can help to prevent type 2 diabetes.  You may want to follow a Mediterranean diet. This diet includes a lot of vegetables, lean meats or fish, whole grains, fruits, and healthy oils and fats. What foods can I eat? Grains Whole grains, such as whole-wheat or whole-grain breads, crackers, cereals, and pasta. Unsweetened oatmeal. Bulgur. Barley. Quinoa. Brown rice. Corn or whole-wheat flour tortillas or taco shells. Vegetables Lettuce. Spinach. Peas. Beets. Cauliflower. Cabbage. Broccoli. Carrots. Tomatoes. Squash. Eggplant. Herbs. Peppers. Onions. Cucumbers. Brussels sprouts. Fruits Berries. Bananas. Apples. Oranges. Grapes. Papaya. Mango. Pomegranate. Kiwi. Grapefruit. Cherries. Meats and Other Protein Sources Seafood. Lean meats, such as chicken and [place] or lean cuts of pork and beef. Tofu. Eggs. Nuts. Beans. Dairy Low-fat or fat-free dairy products, such as yogurt, cottage cheese, and cheese. Beverages Water. Tea. Coffee. Sugar-free or diet soda. Seltzer water. Milk. Milk alternatives, such as soy or almond milk. Condiments Mustard. Relish. Low-fat, low-sugar ketchup. Low-fat, low-sugar barbecue sauce. Low-fat or fat-free mayonnaise. Sweets and Desserts Sugar-free or low-fat pudding. Sugar-free or low-fat ice cream and other frozen treats.  Fats and Oils Avocado. Walnuts. Olive oil. The items listed above may not be a complete list of recommended foods or beverages. Contact your dietitian for more options. What foods are not recommended? Grains Refined white flour and flour products, such as bread, pasta, snack foods, and cereals. Beverages Sweetened drinks, such as sweet iced tea and  soda. Sweets and Desserts Baked goods, such as cake, cupcakes, pastries, cookies, and cheesecake. The items listed above may not be a complete list of foods and beverages to avoid. Contact your dietitian for more information. This information is not intended to replace advice given to you by your health care provider. Make sure you discuss any questions you have with your health care provider. Document Released: 02/21/2015 Document Revised: 03/14/2016 Document Reviewed: 11/02/2014 Elsevier Interactive Patient Education  2017 Else[employer] Inc.  Preventing Type 2 Diabetes Mellitus Type 2 diabetes (type 2 diabetes mellitus) is a long-term (chronic) disease that affects blood sugar (glucose) levels. Normally, a hormone called insulin allows glucose to enter cells in the body. The cells use glucose for energy. In type 2 diabetes, one or both of these problems may be present:  The body does not make enough insulin.  The body does not respond properly to insulin that it makes (insulin resistance).  Insulin resistance or lack of insulin causes excess glucose to build up in the blood instead of going into cells. As a result, high blood glucose (hyperglycemia) develops, which can cause many complications. Being overweight or obese and having an inactive (sedentary) lifestyle can increase your risk for diabetes. Type 2 diabetes can be delayed or prevented by making certain nutrition and lifestyle changes. What nutrition changes can be made?  Eat healthy meals and snacks regularly. Keep a healthy snack with you for when you get hungry between meals, such as fruit or a handful of nuts.  Eat lean meats and proteins that are low in saturated fats, such as chicken, fish, egg whites, and beans. Avoid processed meats.  Eat plenty of fruits and vegetables and plenty of grains that have not been processed (whole grains). It is recommended that you eat: ? 1?2 cups of fruit every day. ? 2?3 cups of vegetables every  day. ? 6?8 oz of whole grains every day, such as oats, whole wheat, bulgur, brown rice, quinoa, and millet.  Eat low-fat dairy products, such as milk, yogurt, and cheese.  Eat foods that contain healthy fats, such as nuts, avocado, olive oil, and canola oil.  Drink water throughout the day. Avoid drinks that contain added sugar, such as soda or sweet tea.  Follow instructions from your health care provider about specific eating or drinking restrictions.  Control how much food you eat at a time (portion size). ? Check food labels to find out the serving sizes of foods. ? Use a kitchen scale to weigh amounts of foods.  Saute or steam food instead of frying it. Cook with water or broth instead of oils or butter.  Limit your intake of: ? Salt (sodium). Have no more than 1 tsp (2,400 mg) of sodium a day. If you have heart disease or high blood pressure, have less than ? tsp (1,500 mg) of sodium a day. ? Saturated fat. This is fat that is solid at room temperature, such as butter or fat on meat. What lifestyle changes can be made?  Activity  Do moderate-intensity physical activity for at least 30 minutes on at least 5 days of the week, or as much as told  by your health care provider.  Ask your health care provider what activities are safe for you. A mix of physical activities may be best, such as walking, swimming, cycling, and strength training.  Try to add physical activity into your day. For example: ? Park in spots that are farther away than usual, so that you walk more. For example, park in a far corner of the parking lot when you go to the office or the grocery store. ? Take a walk during your lunch break. ? Use stairs instead of elevators or escalators. Weight Loss  Lose weight as directed. Your health care provider can determine how much weight loss is best for you and can help you lose weight safely.  If you are overweight or obese, you may be instructed to lose at least 5?7  % of your body weight. Alcohol and Tobacco   Limit alcohol intake to no more than 1 drink a day for nonpregnant women and 2 drinks a day for men. One drink equals 12 oz of beer, 5 oz of wine, or 1 oz of hard liquor.  Do not use any tobacco products, such as cigarettes, chewing tobacco, and e-cigarettes. If you need help quitting, ask your health care provider. Work [facility] [place] Provider  Have your blood glucose tested regularly, as told by your health care provider.  Discuss your risk factors and how you can reduce your risk for diabetes.  Get screening tests as told by your health care provider. You may have screening tests regularly, especially if you have certain risk factors for type 2 diabetes.  Make an appointment with a diet and nutrition specialist (registered dietitian). A registered dietitian can help you make a healthy eating plan and can help you understand portion sizes and food labels. Why are these changes important?  It is possible to prevent or delay type 2 diabetes and related health problems by making lifestyle and nutrition changes.  It can be difficult to recognize signs of type 2 diabetes. The best way to avoid possible damage to your body is to take actions to prevent the disease before you develop symptoms. What can happen if changes are not made?  Your blood glucose levels may keep increasing. Having high blood glucose for a long time is dangerous. Too much glucose in your blood can damage your blood vessels, heart, kidneys, nerves, and eyes.  You may develop prediabetes or type 2 diabetes. Type 2 diabetes can lead to many chronic health problems and complications, such as: ? Heart disease. ? Stroke. ? Blindness. ? Kidney disease. ? Depression. ? Poor circulation in the feet and legs, which could lead to surgical removal (amputation) in severe cases. Where to find support:  Ask your health care provider to recommend a registered dietitian, diabetes  educator, or weight loss program.  Look for local or online weight loss groups.  Join a gym, fitness club, or outdoor activity group, such as a walking club. Where to find more information: To learn more about diabetes and diabetes prevention, visit:  American Diabetes Association (ADA): www.diabetes.[employer] of Diabetes and Digestive and Kidney Diseases: FindSpin.nl  To learn more about healthy eating, visit:  The U.S. Department of Agriculture Scientist, research (physical sciences)), Choose My Plate: http://wiley-williams.com/  Office of Disease Prevention and Health Promotion (ODPHP), Dietary Guidelines: SurferLive.at  Summary  You can reduce your risk for type 2 diabetes by increasing your physical activity, eating healthy foods, and losing weight as directed.  Talk with your  health care provider about your risk for type 2 diabetes. Ask about any blood tests or screening tests that you need to have. This information is not intended to replace advice given to you by your health care provider. Make sure you discuss any questions you have with your health care provider. Document Released: 01/29/2016 Document Revised: 03/14/2016 Document Reviewed: 11/28/2015 Elsevier Interactive Patient Education  [employer].

## 2018-05-05 LAB — HEMOGLOBIN A1C
Est. average glucose Bld gHb Est-mCnc: 120 mg/dL
Hgb A1c MFr Bld: 5.8 % — ABNORMAL HIGH (ref 4.8–5.6)

## 2018-05-14 MED FILL — VENLAFAXINE HCL ER 150 MG C: 150 | 30 days supply | Qty: 30 | Fill #1

## 2018-06-09 ENCOUNTER — Encounter: Payer: Self-pay | Admitting: Nurse Practitioner

## 2018-06-09 ENCOUNTER — Ambulatory Visit: Payer: Self-pay | Attending: Nurse Practitioner | Admitting: Nurse Practitioner

## 2018-06-09 VITALS — BP 150/91 | HR 75 | Temp 99.0°F | Ht 62.0 in | Wt 155.6 lb

## 2018-06-09 DIAGNOSIS — F339 Major depressive disorder, recurrent, unspecified: Secondary | ICD-10-CM | POA: Insufficient documentation

## 2018-06-09 DIAGNOSIS — I10 Essential (primary) hypertension: Secondary | ICD-10-CM | POA: Insufficient documentation

## 2018-06-09 DIAGNOSIS — R7303 Prediabetes: Secondary | ICD-10-CM | POA: Insufficient documentation

## 2018-06-09 DIAGNOSIS — Z0000 Encounter for general adult medical examination without abnormal findings: Secondary | ICD-10-CM

## 2018-06-09 DIAGNOSIS — M79641 Pain in right hand: Secondary | ICD-10-CM | POA: Insufficient documentation

## 2018-06-09 LAB — GLUCOSE, POCT (MANUAL RESULT ENTRY): POC GLUCOSE: 118 mg/dL — AB (ref 70–99)

## 2018-06-09 MED ORDER — HYDROCHLOROTHIAZIDE 25 MG PO TABS: 25.0000 mg | ORAL_TABLET | Freq: Every day | ORAL | 3 refills | Status: DC

## 2018-06-09 MED ORDER — IBUPROFEN 800 MG PO TABS: 800.0000 mg | ORAL_TABLET | Freq: Four times a day (QID) | ORAL | 1 refills | Status: AC | PRN

## 2018-06-09 MED ORDER — VENLAFAXINE HCL ER 150 MG PO CP24: 150.0000 mg | ORAL_CAPSULE | ORAL | 1 refills | Status: DC

## 2018-06-09 MED ORDER — LISINOPRIL 10 MG PO TABS: 10.0000 mg | ORAL_TABLET | Freq: Every day | ORAL | 3 refills | Status: DC

## 2018-06-09 MED FILL — IBUPROFEN 800 MG TABLET: 800 | 15 days supply | Qty: 60 | Fill #0

## 2018-06-09 MED FILL — LISINOPRIL 10 MG TABS: 10 | 30 days supply | Qty: 30 | Fill #0

## 2018-06-09 MED FILL — HYDROCHLOROTHIAZIDE 25 MG T: 25 | 30 days supply | Qty: 30 | Fill #0

## 2018-06-09 MED FILL — VENLAFAXINE HCL ER 150 MG C: 150 | 30 days supply | Qty: 30 | Fill #2

## 2018-06-09 NOTE — Progress Notes (Signed)
Assessment & Plan:  Lauren [name] was seen today for follow-up.  Diagnoses and all orders for this visit:  Essential hypertension -     lisinopril (PRINIVIL,ZESTRIL) 10 MG tablet; Take 1 tablet (10 mg total) by mouth daily. -     CMP14+EGFR Continue all antihypertensives as prescribed.  Remember to bring in your blood pressure log with you for your follow up appointment.  DASH/Mediterranean Diets are healthier choices for HTN.   Prediabetes -     Glucose (CBG) Continue blood sugar control as discussed in office today, low carbohydrate diet, and regular physical exercise as tolerated, 150 minutes per week (30 min each day, 5 days per week, or 50 min 3 days per week). Keep blood sugar logs with fasting goal of 90-130 mg/dl, post prandial (after you eat) less than 180.  For Hypoglycemia: BS <60 and Hyperglycemia BS >400; contact the clinic ASAP. Annual eye exams and foot exams are recommended.   Recurrent major depressive disorder, remission status unspecified (HCC) -     venlafaxine XR (EFFEXOR-XR) 150 MG 24 hr capsule; Take 1 capsule (150 mg total) by mouth every morning. Chronic. Stable and well controlled.   Right hand pain -     ibuprofen (ADVIL,MOTRIN) 800 MG tablet; Take 1 tablet (800 mg total) by mouth every 6 (six) hours as needed for moderate pain. Continue right wrist splint   Routine adult health maintenance -     VITAMIN D 25 Hydroxy (Vit-D Deficiency, Fractures)     Patient has been counseled on age-appropriate routine health concerns for screening and prevention. These are reviewed and up-to-date. Referrals have been placed accordingly. Immunizations are up-to-date or declined.    Subjective:   Chief Complaint  Patient presents with  . Follow-up    Patient stated she had coffee this morning. Patient stated she have right hand pain.    HPI Lauren [name] 52 y.o. female presents to office today with complaints of right hand pain.  Her blood pressure is also elevated  today and has been elevated for most of her previous office visits.  We will start her on lisinopril 10 mg today. Denies chest pain, shortness of breath, palpitations, lightheadedness, dizziness, headaches or BLE edema.   RIGHT HAND PAIN Pain is chronic.  She had carpel tunnel surgery of the right hand in 2014.  She reports after the surgery she never experienced complete relief of right hand pain.  Will need to see the hand specialist and have a right hand x-ray performed when her new insurance begins next month. Pain is located in the wrist and hands. There is some numbness of the right middle finger.  She is currently wearing a right wrist splint which she reports provides significant relief of her pain.  She also takes 800 mg of Advil for pain relief BP Readings from Last 3 Encounters:  06/09/18 (!) 150/91  05/04/18 (!) 151/84  03/12/18 (!) 145/87    Review of Systems  Constitutional: Negative for fever, malaise/fatigue and weight loss.  HENT: Negative.  Negative for nosebleeds.   Eyes: Negative.  Negative for blurred vision, double vision and photophobia.  Respiratory: Negative.  Negative for cough and shortness of breath.   Cardiovascular: Negative.  Negative for chest pain, palpitations and leg swelling.  Gastrointestinal: Negative.  Negative for heartburn, nausea and vomiting.  Musculoskeletal: Positive for joint pain. Negative for myalgias.       See HPI  Neurological: Negative.  Negative for dizziness, focal weakness, seizures and headaches.  Psychiatric/Behavioral:  Positive for depression. Negative for suicidal ideas.    Past Medical History:  Diagnosis Date  . Depression   . Prediabetes     Past Surgical History:  Procedure Laterality Date  . ABDOMINAL HYSTERECTOMY    . BACK SURGERY    . CESAREAN SECTION    . HAND SURGERY    . NECK SURGERY  2002   plate in her neck    Family History  Problem Relation Age of Onset  . Diabetes Father     Social History Reviewed  with no changes to be made today.   Outpatient Medications Prior to Visit  Medication Sig Dispense Refill  . venlafaxine XR (EFFEXOR-XR) 150 MG 24 hr capsule Take 1 capsule (150 mg total) by mouth every morning. 90 capsule 1  . acetaminophen (TYLENOL) 500 MG tablet Take 1 tablet (500 mg total) every 6 (six) hours as needed by mouth. (Patient not taking: Reported on 01/06/2018) 30 tablet 0  . DimenhyDRINATE (DRAMAMINE PO) Take 25 mg by mouth.    Marland Kitchen omeprazole (PRILOSEC) 20 MG capsule Take 1 capsule (20 mg total) by mouth daily. (Patient not taking: Reported on 09/22/2017) 30 capsule 3  . ibuprofen (ADVIL,MOTRIN) 200 MG tablet Take 600 mg by mouth every 6 (six) hours as needed for moderate pain.     No facility-administered medications prior to visit.     No Known Allergies     Objective:    BP (!) 150/91 (BP Location: Left Arm, Patient Position: Sitting, Cuff Size: Large)   Pulse 75   Temp 99 F (37.2 C) (Oral)   Ht '5\' 2"'  (1.575 m)   Wt 155 lb 9.6 oz (70.6 kg)   SpO2 99%   BMI 28.46 kg/m  Wt Readings from Last 3 Encounters:  06/09/18 155 lb 9.6 oz (70.6 kg)  05/04/18 153 lb 3.2 oz (69.5 kg)  03/12/18 154 lb 12.8 oz (70.2 kg)    Physical Exam  Constitutional: She is oriented to person, place, and time. She appears well-developed and well-nourished. She is cooperative.  HENT:  Head: Normocephalic and atraumatic.  Eyes: EOM are normal.  Neck: Normal range of motion.  Cardiovascular: Normal rate, regular rhythm, normal heart sounds and intact distal pulses. Exam reveals no gallop and no friction rub.  No murmur heard. Pulmonary/Chest: Effort normal and breath sounds normal. No tachypnea. No respiratory distress. She has no decreased breath sounds. She has no wheezes. She has no rhonchi. She has no rales. She exhibits no tenderness.  Abdominal: Bowel sounds are normal.  Musculoskeletal: Normal range of motion. She exhibits no edema, tenderness or deformity.       Right hand: She  exhibits normal range of motion, no tenderness, no bony tenderness, normal two-point discrimination, normal capillary refill, no deformity, no laceration and no swelling. Normal sensation noted. Normal strength noted.  Neurological: She is alert and oriented to person, place, and time. Coordination normal.  Skin: Skin is warm and dry.  Psychiatric: She has a normal mood and affect. Judgment and thought content normal. She expresses no homicidal and no suicidal ideation. She expresses no suicidal plans and no homicidal plans.  Nursing note and vitals reviewed.      Patient has been counseled extensively about nutrition and exercise as well as the importance of adherence with medications and regular follow-up. The patient was given clear instructions to go to ER or return to medical center if symptoms don't improve, worsen or new problems develop. The patient verbalized understanding.  Follow-up: Return in about 4 weeks (around 07/07/2018) for BP recheck/ right hand pain.   Gildardo Pounds, FNP-BC [facility] [place], [place]   06/09/2018, 11:10 AM

## 2018-06-09 NOTE — Patient Instructions (Signed)

## 2018-06-10 LAB — CMP14+EGFR
ALT: 18 IU/L (ref 0–32)
AST: 21 IU/L (ref 0–40)
Albumin/Globulin Ratio: 1.6 (ref 1.2–2.2)
Albumin: 4.4 g/dL (ref 3.5–5.5)
Alkaline Phosphatase: 85 IU/L (ref 39–117)
BUN/Creatinine Ratio: 14 (ref 9–23)
BUN: 10 mg/dL (ref 6–24)
CALCIUM: 9.6 mg/dL (ref 8.7–10.2)
CHLORIDE: 103 mmol/L (ref 96–106)
CO2: 21 mmol/L (ref 20–29)
Creatinine, Ser: 0.7 mg/dL (ref 0.57–1.00)
GFR, EST AFRICAN AMERICAN: 115 mL/min/{1.73_m2} (ref >59)
GFR, EST NON AFRICAN AMERICAN: 100 mL/min/{1.73_m2} (ref >59)
GLUCOSE: 93 mg/dL (ref 65–99)
Globulin, Total: 2.7 g/dL (ref 1.5–4.5)
Potassium: 4 mmol/L (ref 3.5–5.2)
Sodium: 141 mmol/L (ref 134–144)
TOTAL PROTEIN: 7.1 g/dL (ref 6.0–8.5)

## 2018-06-10 LAB — VITAMIN D 25 HYDROXY (VIT D DEFICIENCY, FRACTURES): Vit D, 25-Hydroxy: 34.2 ng/mL (ref 30.0–100.0)

## 2018-06-25 ENCOUNTER — Telehealth: Payer: Self-pay | Admitting: Nurse Practitioner

## 2018-06-25 NOTE — Telephone Encounter (Signed)
Patient should only be taking 10mg  of lisinopril at this time. Thanks!

## 2018-06-25 NOTE — Telephone Encounter (Signed)
CMA spoke to patient.  Patient stated she is taking Hydrochlorothiazide 25mg  and Lisinopril 10mg  for BP and Is making her stomach cramp up.  CMA looked at patient chart and see the updated medication list is patient only supposed to take Lisinopril 25mg .  Pt. Stated she will stop taking the Hydrochlorothiazide 25mg  and only take Lisinopril until she hear back from her PCP.   Please advised.

## 2018-06-25 NOTE — Telephone Encounter (Signed)
Patient called regarding Medication reaction. Patient is unsure which medication is causing the issue. Please follow up with patient.

## 2018-06-29 NOTE — Telephone Encounter (Signed)
CMA attempt to call patient to inform her to be taking 10mg  of Lisinopril for blood pressure at this time.  No answer and left a VM for patient.

## 2018-07-07 ENCOUNTER — Ambulatory Visit: Payer: BLUE CROSS/BLUE SHIELD | Attending: Nurse Practitioner | Admitting: Pharmacist

## 2018-07-07 DIAGNOSIS — Z79899 Other long term (current) drug therapy: Secondary | ICD-10-CM | POA: Insufficient documentation

## 2018-07-07 DIAGNOSIS — I10 Essential (primary) hypertension: Secondary | ICD-10-CM | POA: Insufficient documentation

## 2018-07-07 NOTE — Progress Notes (Signed)
   S:    PCP: [name]   Patient arrives in good spirits. Presents to the clinic for hypertension evaluation, counseling, and management. Patient was referred by Zelda on 06/09/18. BP at that visit 150/91. Zelda started lisinopril 10 mg daily. Of note, pt was also on HCTZ but that was recently stopped on 06/25/18 after pt reported stomach cramping.  Today, she denies CP, SOB, HA, or blurred vision. Denies LE edema. Patient reports adherence with medications.  Current BP Medications include:   - lisinopril 10 mg daily  Antihypertensives tried in the past include:  - HCTZ 25 mg daily  Dietary habits include:  - eats frozen meals  - Does not limit salt - 1 cup coffee every morning  Exercise habits include: - "lifting, twisting, turning" at work for 11 hours  Family / Social history:  FH: DM (father) Tobacco: current 0.5 packs/day smoker Alcohol: denies  Home BP readings:  - does not monitor at home  O:  L arm after 5 minutes rest 116/69, HR 65  Last 3 Office BP readings: BP Readings from Last 3 Encounters:  06/09/18 (!) 150/91  05/04/18 (!) 151/84  03/12/18 (!) 145/87    BMET    Component Value Date/Time   NA 141 06/09/2018 0921   K 4.0 06/09/2018 0921   CL 103 06/09/2018 0921   CO2 21 06/09/2018 0921   GLUCOSE 93 06/09/2018 0921   GLUCOSE 116 (H) 01/06/2018 0403   BUN 10 06/09/2018 0921   CREATININE 0.70 06/09/2018 0921   CALCIUM 9.6 06/09/2018 0921   GFRNONAA 100 06/09/2018 0921   GFRAA 115 06/09/2018 0921    Renal function: CrCl cannot be calculated (Patient's most recent lab result is older than the maximum 21 days allowed.).  A/P: Hypertension longstanding currently controlled on current medications. BP Goal <130/80 mmHg. Patient is adherent with current medications.  -Continued lisinopril 10 mg daily  -Counseled on lifestyle modifications for blood pressure control including reduced dietary sodium, increased exercise, adequate sleep  Results reviewed  and written information provided.   Total time in face-to-face counseling 15 minutes.   F/U PCP Visit 07/17/18.    Patient seen with Marylene Buerger, PharmD Candidate [employer] Class of 2021  Benard Halsted, PharmD, [place] [phone]

## 2018-07-07 NOTE — Patient Instructions (Signed)

## 2018-07-17 ENCOUNTER — Ambulatory Visit: Payer: BLUE CROSS/BLUE SHIELD | Attending: Nurse Practitioner | Admitting: Nurse Practitioner

## 2018-07-17 ENCOUNTER — Encounter: Payer: Self-pay | Admitting: Nurse Practitioner

## 2018-07-17 VITALS — BP 145/92 | HR 60 | Temp 98.3°F | Ht 62.0 in | Wt 156.6 lb

## 2018-07-17 DIAGNOSIS — M79641 Pain in right hand: Secondary | ICD-10-CM

## 2018-07-17 DIAGNOSIS — R05 Cough: Secondary | ICD-10-CM | POA: Insufficient documentation

## 2018-07-17 DIAGNOSIS — R1013 Epigastric pain: Secondary | ICD-10-CM | POA: Diagnosis not present

## 2018-07-17 DIAGNOSIS — Z79899 Other long term (current) drug therapy: Secondary | ICD-10-CM | POA: Insufficient documentation

## 2018-07-17 DIAGNOSIS — F339 Major depressive disorder, recurrent, unspecified: Secondary | ICD-10-CM | POA: Insufficient documentation

## 2018-07-17 DIAGNOSIS — K219 Gastro-esophageal reflux disease without esophagitis: Secondary | ICD-10-CM | POA: Insufficient documentation

## 2018-07-17 DIAGNOSIS — G8929 Other chronic pain: Secondary | ICD-10-CM | POA: Insufficient documentation

## 2018-07-17 DIAGNOSIS — Z90710 Acquired absence of both cervix and uterus: Secondary | ICD-10-CM | POA: Insufficient documentation

## 2018-07-17 MED ORDER — VENLAFAXINE HCL ER 150 MG PO CP24: 150.0000 mg | ORAL_CAPSULE | ORAL | 1 refills | Status: DC

## 2018-07-17 MED ORDER — OMEPRAZOLE 20 MG PO CPDR: 20.0000 mg | DELAYED_RELEASE_CAPSULE | Freq: Every day | ORAL | 3 refills | Status: DC

## 2018-07-17 MED FILL — VENLAFAXINE HCL ER 150 MG C: 150 | 30 days supply | Qty: 30 | Fill #0

## 2018-07-17 MED FILL — LISINOPRIL 10 MG TABS: 10 | 30 days supply | Qty: 30 | Fill #1

## 2018-07-17 MED FILL — OMEPRAZOLE 20 MG CAP: 20 | 30 days supply | Qty: 30 | Fill #0

## 2018-07-17 NOTE — Progress Notes (Signed)
Assessment & Plan:  Lauren [name] was seen today for follow-up and medication problem.  Diagnoses and all orders for this visit:  Epigastric pain -     omeprazole (PRILOSEC) 20 MG capsule; Take 1 capsule (20 mg total) by mouth daily. INSTRUCTIONS: Avoid GERD Triggers: acidic, spicy or fried foods, caffeine, coffee, sodas,  alcohol and chocolate.   Recurrent major depressive disorder, remission status unspecified (HCC) -     venlafaxine XR (EFFEXOR-XR) 150 MG 24 hr capsule; Take 1 capsule (150 mg total) by mouth every morning.  Right hand pain -     Ambulatory referral to Hand Surgery    Patient has been counseled on age-appropriate routine health concerns for screening and prevention. These are reviewed and up-to-date. Referrals have been placed accordingly. Immunizations are up-to-date or declined.    Subjective:   Chief Complaint  Patient presents with  . Follow-up    Pt. stated her right hand pain is still going on.   . Medication Problem    Pt. stated she was taking Lisinopril and Venalafaxine and only taking Lisinopril bc taking both make her stomach hurts.    HPI Lauren [name] 52 y.o. female presents to office today for follow up to right hand pain. She also has complaints of increased productive cough at night. Cough goes away on its own. She was prescribed omeprazole at her last appointment with me in August however unfortunately this medication was not filled for her at the pharmacy.  Will fill for her today.   Chronic right hand pain  She has chronic right hand pain and endorses this pain has been present since her carpal tunnel tunnel surgery in 2014.  She never experienced complete relief of right hand pain after that surgery.  Pain is located in the right wrist and right palm.  There is dysmotility of the fourth (ring) finger along with numbness of the right third (middle) finger.  She has been wearing a copper sleeve which has improved her pain somewhat and is also taking 800  mg of ibuprofen for pain relief.  Aggravating factors: Bending, twisting, and repetitive hand movement.   GERD/Cough Patient complains of heartburn. This has been associated with hoarseness,  midespigastric pain and productive night time cough when lying down after eating. She gets off at 7pm and eats dinner late at night. .  She denies choking on food, deep pressure at base of neck, difficulty swallowing, hematemesis and nausea. Symptoms have been present for a few months. She denies dysphagia.  She has not lost weight. She denies melena, hematochezia, hematemesis, and coffee ground emesis. Medical therapy in the past has included none.     Review of Systems  Constitutional: Negative for fever, malaise/fatigue and weight loss.  HENT: Negative.  Negative for nosebleeds.   Eyes: Negative.  Negative for blurred vision, double vision and photophobia.  Respiratory: Positive for cough (increased at night) and sputum production. Negative for shortness of breath.   Cardiovascular: Negative.  Negative for chest pain, palpitations and leg swelling.  Gastrointestinal: Positive for heartburn. Negative for abdominal pain, blood in stool, constipation, diarrhea, melena, nausea and vomiting.  Musculoskeletal: Positive for joint pain. Negative for myalgias.       SEE HPI  Neurological: Negative.  Negative for dizziness, focal weakness, seizures and headaches.  Psychiatric/Behavioral: Positive for depression. Negative for suicidal ideas.    Past Medical History:  Diagnosis Date  . Depression   . Prediabetes     Past Surgical History:  Procedure Laterality  Date  . ABDOMINAL HYSTERECTOMY    . BACK SURGERY    . CESAREAN SECTION    . HAND SURGERY    . NECK SURGERY  2002   plate in her neck    Family History  Problem Relation Age of Onset  . Diabetes Father     Social History Reviewed with no changes to be made today.   Outpatient Medications Prior to Visit  Medication Sig Dispense Refill    . lisinopril (PRINIVIL,ZESTRIL) 10 MG tablet Take 1 tablet (10 mg total) by mouth daily. 90 tablet 3  . acetaminophen (TYLENOL) 500 MG tablet Take 1 tablet (500 mg total) every 6 (six) hours as needed by mouth. (Patient not taking: Reported on 01/06/2018) 30 tablet 0  . DimenhyDRINATE (DRAMAMINE PO) Take 25 mg by mouth.    Marland Kitchen omeprazole (PRILOSEC) 20 MG capsule Take 1 capsule (20 mg total) by mouth daily. (Patient not taking: Reported on 09/22/2017) 30 capsule 3  . venlafaxine XR (EFFEXOR-XR) 150 MG 24 hr capsule Take 1 capsule (150 mg total) by mouth every morning. (Patient not taking: Reported on 07/17/2018) 90 capsule 1   No facility-administered medications prior to visit.     No Known Allergies     Objective:    BP (!) 145/92 (BP Location: Left Arm, Patient Position: Sitting, Cuff Size: Normal)   Pulse 60   Temp 98.3 F (36.8 C) (Oral)   Ht 5\' 2"  (1.575 m)   Wt 156 lb 9.6 oz (71 kg)   SpO2 100%   BMI 28.64 kg/m  Wt Readings from Last 3 Encounters:  07/17/18 156 lb 9.6 oz (71 kg)  06/09/18 155 lb 9.6 oz (70.6 kg)  05/04/18 153 lb 3.2 oz (69.5 kg)    Physical Exam  Constitutional: She is oriented to person, place, and time. She appears well-developed and well-nourished. She is cooperative.  HENT:  Head: Normocephalic and atraumatic.  Eyes: EOM are normal.  Neck: Normal range of motion.  Cardiovascular: Normal rate, regular rhythm and normal heart sounds. Exam reveals no gallop and no friction rub.  No murmur heard. Pulmonary/Chest: Effort normal and breath sounds normal. No stridor. No tachypnea. No respiratory distress. She has no decreased breath sounds. She has no wheezes. She has no rhonchi. She has no rales. She exhibits no tenderness.  Abdominal: Bowel sounds are normal. She exhibits no distension. There is no tenderness.  Musculoskeletal: Normal range of motion. She exhibits no edema.       Right wrist: She exhibits swelling.  Neurological: She is alert and oriented  to person, place, and time. Coordination normal.  Skin: Skin is warm and dry.  Psychiatric: She has a normal mood and affect. Her behavior is normal. Judgment and thought content normal.  Nursing note and vitals reviewed.      Patient has been counseled extensively about nutrition and exercise as well as the importance of adherence with medications and regular follow-up. The patient was given clear instructions to go to ER or return to medical center if symptoms don't improve, worsen or new problems develop. The patient verbalized understanding.   Follow-up: Return in about 4 weeks (around 08/14/2018) for GERD/Right hand pain.   Gildardo Pounds, FNP-BC [facility] [place], [place]   07/17/2018, 9:32 AM

## 2018-07-28 ENCOUNTER — Ambulatory Visit (INDEPENDENT_AMBULATORY_CARE_PROVIDER_SITE_OTHER): Payer: Self-pay

## 2018-07-28 ENCOUNTER — Encounter (INDEPENDENT_AMBULATORY_CARE_PROVIDER_SITE_OTHER): Payer: Self-pay | Admitting: Orthopaedic Surgery

## 2018-07-28 ENCOUNTER — Ambulatory Visit (INDEPENDENT_AMBULATORY_CARE_PROVIDER_SITE_OTHER): Payer: BLUE CROSS/BLUE SHIELD | Admitting: Orthopaedic Surgery

## 2018-07-28 DIAGNOSIS — M65341 Trigger finger, right ring finger: Secondary | ICD-10-CM | POA: Diagnosis not present

## 2018-07-28 DIAGNOSIS — M79641 Pain in right hand: Secondary | ICD-10-CM

## 2018-07-28 NOTE — Progress Notes (Signed)
Office Visit Note   Patient: Lauren [name]           Date of Birth: [birth year]           MRN: [id number] Visit Date: 07/28/2018              Requested by: Gildardo Pounds, NP [place], [place] [address] PCP: Gildardo Pounds, NP   Assessment & Plan: Visit Diagnoses:  1. Pain in right hand   2. Trigger finger, right ring finger     Plan: Right hand pain with questionable recurrent carpal tunnel syndrome versus autoimmune disease.  At this point, we will obtain a nerve conduction study of the right upper extremity.  She will follow-up with [place] once that has been completed.  I did offer towards an injection to her trigger finger, but she would like to hold off for now as she does not think this is very bothersome.  Call with concerns or questions in the meantime.  Follow-Up Instructions: Return in about 2 weeks (around 08/11/2018) for discuss ncs.   Orders:  Orders Placed This Encounter  Procedures  . XR Hand Complete Right  . Ambulatory referral to Physical Medicine Rehab   No orders of the defined types were placed in this encounter.     Procedures: No procedures performed   Clinical Data: No additional findings.   Subjective: Chief Complaint  Patient presents with  . Right Hand - Pain    HPI patient is a pleasant 52 year old right-hand-dominant female who presents to our clinic today with continued right hand pain.  This is been ongoing for the past 6 years.  She does note that she worked in [employer] for the last 20 years.  No other specific injury.  The pain she has is to the entire right hand.  She has associated swelling as well as locking to the ring finger.  Pain and swelling appear to be worse first thing in the morning.  Both significantly improved wearing a copper gloves.  She occasionally wakes up with numbness to the right hand.  She does note that she had a right carpal tunnel release by a physician in [place] back in 2014.  She denies  any improvement of symptoms following surgery.  No history of autoimmune disease.  Review of Systems as detailed in HPI.  All others reviewed and are negative.   Objective: Vital Signs: There were no vitals taken for this visit.  Physical Exam well-developed well-nourished female in no acute distress.  Alert and oriented x3.  Ortho Exam examination of the right hand reveals no swelling.  She does have marked tenderness and a palpable nodule at the A1 pulley right ring finger.  Minimally positive Phalen and Tinel.  Full grip strength.  She is neurovascularly intact distally.  Specialty Comments:  No specialty comments available.  Imaging: Xr Hand Complete Right  Result Date: 07/28/2018 Mild first [facility] arthritic change    PMFS History: Patient Active Problem List   Diagnosis Date Noted  . Pain in right hand 07/28/2018  . Trigger finger, right ring finger 07/28/2018  . Mixed hyperlipidemia 06/19/2017  . Gastroesophageal reflux disease 04/15/2017  . OSA (obstructive sleep apnea) 04/15/2017  . Depression 11/28/2016   Past Medical History:  Diagnosis Date  . Depression   . Prediabetes     Family History  Problem Relation Age of Onset  . Diabetes Father     Past Surgical History:  Procedure Laterality Date  .  ABDOMINAL HYSTERECTOMY    . BACK SURGERY    . CESAREAN SECTION    . HAND SURGERY    . NECK SURGERY  2002   plate in her neck   Social History   Occupational History  . Not on file  Tobacco Use  . Smoking status: Current Every Day Smoker    Packs/day: 0.50    Types: Cigarettes  . Smokeless tobacco: Never Used  Substance and Sexual Activity  . Alcohol use: No  . Drug use: No  . Sexual activity: Not Currently    Birth control/protection: None

## 2018-08-01 ENCOUNTER — Emergency Department (HOSPITAL_COMMUNITY): Payer: BLUE CROSS/BLUE SHIELD

## 2018-08-01 ENCOUNTER — Encounter (HOSPITAL_COMMUNITY): Payer: Self-pay | Admitting: Emergency Medicine

## 2018-08-01 ENCOUNTER — Emergency Department (HOSPITAL_COMMUNITY)
Admission: EM | Admit: 2018-08-01 | Discharge: 2018-08-01 | Disposition: A | Discharge status: Home / Self Care | Payer: BLUE CROSS/BLUE SHIELD | Attending: Emergency Medicine | Admitting: Emergency Medicine

## 2018-08-01 ENCOUNTER — Other Ambulatory Visit: Payer: Self-pay

## 2018-08-01 DIAGNOSIS — F17210 Nicotine dependence, cigarettes, uncomplicated: Secondary | ICD-10-CM | POA: Insufficient documentation

## 2018-08-01 DIAGNOSIS — M542 Cervicalgia: Secondary | ICD-10-CM | POA: Diagnosis present

## 2018-08-01 DIAGNOSIS — M5412 Radiculopathy, cervical region: Secondary | ICD-10-CM | POA: Diagnosis not present

## 2018-08-01 DIAGNOSIS — Z79899 Other long term (current) drug therapy: Secondary | ICD-10-CM | POA: Diagnosis not present

## 2018-08-01 LAB — CBC
HCT: 44.3 % (ref 36.0–46.0)
Hemoglobin: 14 g/dL (ref 12.0–15.0)
MCH: 29.4 pg (ref 26.0–34.0)
MCHC: 31.6 g/dL (ref 30.0–36.0)
MCV: 93.1 fL (ref 80.0–100.0)
NRBC: 0 % (ref 0.0–0.2)
PLATELETS: 346 10*3/uL (ref 150–400)
RBC: 4.76 MIL/uL (ref 3.87–5.11)
RDW: 13.2 % (ref 11.5–15.5)
WBC: 6.3 10*3/uL (ref 4.0–10.5)

## 2018-08-01 LAB — I-STAT BETA HCG BLOOD, ED (MC, WL, AP ONLY)

## 2018-08-01 LAB — BASIC METABOLIC PANEL
ANION GAP: 9 (ref 5–15)
BUN: 11 mg/dL (ref 6–20)
CALCIUM: 9.3 mg/dL (ref 8.9–10.3)
CO2: 23 mmol/L (ref 22–32)
Chloride: 105 mmol/L (ref 98–111)
Creatinine, Ser: 0.7 mg/dL (ref 0.44–1.00)
GFR calc Af Amer: >60 mL/min (ref >60)
Glucose, Bld: 103 mg/dL — ABNORMAL HIGH (ref 70–99)
Potassium: 4 mmol/L (ref 3.5–5.1)
SODIUM: 137 mmol/L (ref 135–145)

## 2018-08-01 LAB — I-STAT TROPONIN, ED: TROPONIN I, POC: 0 ng/mL (ref 0.00–0.08)

## 2018-08-01 MED ORDER — PREDNISONE 20 MG PO TABS: 20.0000 mg | ORAL_TABLET | Freq: Two times a day (BID) | ORAL | 0 refills | Status: DC

## 2018-08-01 NOTE — ED Provider Notes (Signed)
[facility] EMERGENCY DEPARTMENT Provider Note   CSN: [id number] Arrival date & time: 08/01/18  7510     History   Chief Complaint Chief Complaint  Patient presents with  . Cough  . Chest Pain  . Arm Pain    HPI [name] is a 52 y.o. female.  HPI   The patient presents for evaluation of neck pain radiating, to right arm and right anterior chest.  The pain is sharp.  The pain is intermittent.  The pain is not reproduced with neck movement or deep breathing.  Symptoms ongoing for greater than 1 week.  She was seen for the same, had an urgent care a week ago and diagnosed with shingles because she had a rash on her bilateral anterior neck.  She was treated with Valtrex.  She states that her rash is improving.  She denies fever, chills, cough, shortness of breath, weakness or dizziness.  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  She has a history of "a plate in my neck."  There are no other known modifying factors.    Past Medical History:  Diagnosis Date  . Depression   . Prediabetes     Patient Active Problem List   Diagnosis Date Noted  . Pain in right hand 07/28/2018  . Trigger finger, right ring finger 07/28/2018  . Mixed hyperlipidemia 06/19/2017  . Gastroesophageal reflux disease 04/15/2017  . OSA (obstructive sleep apnea) 04/15/2017  . Depression 11/28/2016    Past Surgical History:  Procedure Laterality Date  . ABDOMINAL HYSTERECTOMY    . BACK SURGERY    . CESAREAN SECTION    . HAND SURGERY    . NECK SURGERY  2002   plate in her neck     OB History    Gravida  1   Para      Term      Preterm      AB      Living  1     SAB      TAB      Ectopic      Multiple      Live Births  1            Home Medications    Prior to Admission medications   Medication Sig Start Date End Date Taking? Authorizing Provider  acyclovir (ZOVIRAX) 800 MG tablet Take 800 mg by mouth 5 (five) times daily. 07/25/18  Yes [provider]  HYDROcodone-acetaminophen (NORCO/VICODIN) 5-325 MG tablet Take 1 tablet by mouth 4 (four) times daily as needed for pain. 07/25/18  Yes [provider]  ibuprofen (ADVIL,MOTRIN) 800 MG tablet Take 800 mg by mouth daily as needed (hand pain).   Yes [provider]  lisinopril (PRINIVIL,ZESTRIL) 10 MG tablet Take 1 tablet (10 mg total) by mouth daily. 06/09/18  Yes Gildardo Pounds, NP  omeprazole (PRILOSEC) 20 MG capsule Take 1 capsule (20 mg total) by mouth daily. 07/17/18  Yes Gildardo Pounds, NP  venlafaxine XR (EFFEXOR-XR) 150 MG 24 hr capsule Take 1 capsule (150 mg total) by mouth every morning. 07/17/18  Yes Gildardo Pounds, NP  acetaminophen (TYLENOL) 500 MG tablet Take 1 tablet (500 mg total) every 6 (six) hours as needed by mouth. Patient not taking: Reported on 08/01/2018 08/25/17   Alfonse Spruce, FNP  predniSONE (DELTASONE) 20 MG tablet Take 1 tablet (20 mg total) by mouth 2 (two) times daily. 08/01/18   Eulis Foster,  Vira Agar, MD    Family History Family History  Problem Relation Age of Onset  . Diabetes Father     Social History Social History   Tobacco Use  . Smoking status: Current Every Day Smoker    Packs/day: 0.50    Types: Cigarettes  . Smokeless tobacco: Never Used  Substance Use Topics  . Alcohol use: No  . Drug use: No     Allergies   Patient has no known allergies.   Review of Systems Review of Systems  All other systems reviewed and are negative.    Physical Exam Updated Vital Signs BP (!) 147/85 (BP Location: Right Arm)   Pulse (!) 54   Temp 99.8 F (37.7 C) (Oral)   Resp 16   SpO2 100%   Physical Exam  Constitutional: She is oriented to person, place, and time. She appears well-developed and well-nourished. She does not appear ill. No distress.  HENT:  Head: Normocephalic and atraumatic.  Right Ear: External ear normal.  Left Ear: External ear normal.  Eyes: Pupils are equal, round, and reactive to light.  Conjunctivae and EOM are normal.  Neck: Normal range of motion and phonation normal. Neck supple.  Cardiovascular: Normal rate, regular rhythm and normal heart sounds.  Pulmonary/Chest: Effort normal and breath sounds normal. No stridor. No respiratory distress. She exhibits no tenderness and no bony tenderness.  Abdominal: Soft. There is no tenderness.  Musculoskeletal: Normal range of motion.  Neurological: She is alert and oriented to person, place, and time. No cranial nerve deficit or sensory deficit. She exhibits normal muscle tone. Coordination normal.  Skin: Skin is warm, dry and intact.  Nonspecific rash, anterior neck, bilateral, few tiny papules with excoriations.  No vesicles, bleeding, drainage or fluctuance.  Psychiatric: She has a normal mood and affect. Her behavior is normal. Judgment and thought content normal.  Nursing note and vitals reviewed.    ED Treatments / Results  Labs (all labs ordered are listed, but only abnormal results are displayed) Labs Reviewed  BASIC METABOLIC PANEL - Abnormal; Notable for the following components:      Result Value   Glucose, Bld 103 (*)    All other components within normal limits  CBC  I-STAT TROPONIN, ED  I-STAT BETA HCG BLOOD, ED ([facility], [facility], [facility] ONLY)    EKG EKG Interpretation  Date/Time:  Saturday August 01 2018 09:45:10 EDT Ventricular Rate:  78 PR Interval:  136 QRS Duration: 80 QT Interval:  386 QTC Calculation: 440 R Axis:   65 Text Interpretation:  Sinus rhythm with marked sinus arrhythmia Otherwise normal ECG since last tracing no significant change Confirmed by Daleen Bo [phone]) on 08/01/2018 10:30:26 AM Also confirmed by Daleen Bo [phone]), editor Lynder Parents [phone])  on 08/01/2018 10:40:49 AM   Radiology Dg Chest 2 View  Result Date: 08/01/2018 CLINICAL DATA:  Chest pain EXAM: CHEST - 2 VIEW COMPARISON:  Chest x-ray dated 09/22/2016. FINDINGS: Cardiomediastinal silhouette is within normal limits  in size and configuration. Lungs are clear. Lung volumes are normal. No evidence of pneumonia. No pleural effusion. No pneumothorax seen. Osseous and soft tissue structures about the chest are unremarkable. Anterior cervical fusion hardware incompletely imaged within the lower cervical spine. IMPRESSION: No active cardiopulmonary disease. Electronically Signed   By: Franki Cabot M.D.   On: 08/01/2018 10:55   Ct Cervical Spine Wo Contrast  Result Date: 08/01/2018 CLINICAL DATA:  Right-sided neck pain radiating into the chest, no known injury, initial encounter EXAM: CT  CERVICAL SPINE WITHOUT CONTRAST TECHNIQUE: Multidetector CT imaging of the cervical spine was performed without intravenous contrast. Multiplanar CT image reconstructions were also generated. COMPARISON:  09/22/2016 FINDINGS: Alignment: Alignment is within normal limits. Skull base and vertebrae: 7 cervical segments are well visualized. Prior fusion at C5-6 with anterior fixation is seen. No acute fracture or acute facet abnormality is noted. Soft tissues and spinal canal: No soft tissue abnormality is noted. Disc levels: Some disc bulging is noted eccentric to the right at C6-7. This is consistent with the patient's given clinical history. Upper chest: Negative. Other: None IMPRESSION: Multilevel degenerative change without acute bony abnormality. Disc bulging eccentric to the right at C6-7. Nonemergent MRI is recommended for further evaluation. Electronically Signed   By: Inez Catalina M.D.   On: 08/01/2018 11:40    Procedures Procedures (including critical care time)  Medications Ordered in ED Medications - No data to display   Initial Impression / Assessment and Plan / ED Course  I have reviewed the triage vital signs and the nursing notes.  Pertinent labs & imaging results that were available during my care of the patient were reviewed by me and considered in my medical decision making (see chart for details).  Clinical Course  as of Aug 03 2207  Nancy Fetter Aug 02, 2018  2205 normal  Basic metabolic panel(!) [EW]  5027 normal  CBC [EW]  2206 normal  I-stat troponin, ED [EW]  2206 normal  I-Stat beta hCG blood, ED [EW]  2206 No infiltrat, images reviewed by me  DG Chest 2 View [EW]  2207 C/w C6-7 HNP, right protrusion, images reviewed by me.  CT Cervical Spine Wo Contrast [EW]    Clinical Course User Index [EW] Daleen Bo, MD     No data found.  At D/C Reevaluation with update and discussion. After initial assessment and treatment, an updated evaluation reveals no further c/o. She is comfortable. Findings discussed.Daleen Bo   Medical Decision Making: Evaluation is c/w cervical radiculopathy. Doubt fracture or myelopathy.  CRITICAL CARE- No Performed by: Daleen Bo  Nursing Notes Reviewed/ Care Coordinated Applicable Imaging Reviewed Interpretation of Laboratory Data incorporated into ED treatment  The patient appears reasonably screened and/or stabilized for discharge and I doubt any other medical condition or other [facility] requiring further screening, evaluation, or treatment in the ED at this time prior to discharge.  Plan: Home Medications- continue usual; Home Treatments- rest; return here if the recommended treatment, does not improve the symptoms; Recommended follow up- PCP and Neurosurgery as needed.   Final Clinical Impressions(s) / ED Diagnoses   Final diagnoses:  Cervical radiculopathy    ED Discharge Orders         Ordered    predniSONE (DELTASONE) 20 MG tablet  2 times daily     08/01/18 1248           Daleen Bo, MD 08/02/18 2208

## 2018-08-01 NOTE — Discharge Instructions (Addendum)
Call your orthopedic doctor on Monday to let them know that you have a disc in the lower neck which is probably causing your right arm pain and numbness.  They may choose to help you with that or refer you to a restless to have a surgical repair.  In the meantime, start the prescription for prednisone which was sent to your pharmacy, and use Tylenol for pain.  You can also try using heat on the sore area 3 or 4 times a day.

## 2018-08-01 NOTE — ED Notes (Signed)
ED Provider at bedside. 

## 2018-08-01 NOTE — ED Notes (Signed)
Patient transported to CT 

## 2018-08-01 NOTE — ED Triage Notes (Signed)
Pt. Stated, Lauren [name] had right arm pain that goes into my chest . I went to [facility] and they said I had shingles.

## 2018-08-03 ENCOUNTER — Telehealth (INDEPENDENT_AMBULATORY_CARE_PROVIDER_SITE_OTHER): Payer: Self-pay | Admitting: Orthopaedic Surgery

## 2018-08-03 NOTE — Telephone Encounter (Signed)
See message below °

## 2018-08-03 NOTE — Telephone Encounter (Signed)
Yes I recommend still getting the ncs.  Thanks.

## 2018-08-03 NOTE — Telephone Encounter (Signed)
Patient went to the ED and had a CT scan, she said the plates have shifted in her neck and are pressing on a nerve, Dr. Christ Kick ordered it and asked that Dr. Erlinda Hong would look over the scan and see if the NCS with [facility] should still be performed.  Patients # [phone]

## 2018-08-04 ENCOUNTER — Other Ambulatory Visit (INDEPENDENT_AMBULATORY_CARE_PROVIDER_SITE_OTHER): Payer: Self-pay

## 2018-08-04 DIAGNOSIS — M79641 Pain in right hand: Secondary | ICD-10-CM

## 2018-08-04 NOTE — Telephone Encounter (Signed)
Made another referral but looks like there is already one in Epic. This is a duplicate, sorry. FYI

## 2018-08-04 NOTE — Telephone Encounter (Signed)
Called patient no answer. LMOM to return call. Just need to let her know per Dr Erlinda Hong continue to get  NCS/EMG.

## 2018-08-07 ENCOUNTER — Telehealth: Payer: Self-pay | Admitting: Nurse Practitioner

## 2018-08-07 NOTE — Telephone Encounter (Signed)
Patient would like for PCP to follow up regarding the CT scan she had done at the ED on Oct 12th, Please follow up with patient. She would also like to be prescribed pain medication. She would like medication to be sent to [facility] on [address].

## 2018-08-07 NOTE — Telephone Encounter (Signed)
Will route to PCP 

## 2018-08-08 ENCOUNTER — Emergency Department (HOSPITAL_COMMUNITY)
Admission: EM | Admit: 2018-08-08 | Discharge: 2018-08-08 | Disposition: A | Discharge status: Home / Self Care | Payer: BLUE CROSS/BLUE SHIELD | Attending: Emergency Medicine | Admitting: Emergency Medicine

## 2018-08-08 ENCOUNTER — Encounter (HOSPITAL_COMMUNITY): Payer: Self-pay | Admitting: Emergency Medicine

## 2018-08-08 DIAGNOSIS — M5412 Radiculopathy, cervical region: Secondary | ICD-10-CM | POA: Diagnosis not present

## 2018-08-08 DIAGNOSIS — M542 Cervicalgia: Secondary | ICD-10-CM | POA: Diagnosis present

## 2018-08-08 DIAGNOSIS — F17210 Nicotine dependence, cigarettes, uncomplicated: Secondary | ICD-10-CM | POA: Diagnosis not present

## 2018-08-08 DIAGNOSIS — Z79899 Other long term (current) drug therapy: Secondary | ICD-10-CM | POA: Diagnosis not present

## 2018-08-08 MED ORDER — PREDNISONE 20 MG PO TABS: ORAL_TABLET | ORAL | 0 refills | Status: DC

## 2018-08-08 MED ORDER — METHOCARBAMOL 500 MG PO TABS: 1000.0000 mg | ORAL_TABLET | Freq: Every evening | ORAL | 0 refills | Status: DC | PRN
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
## 2018-08-08 NOTE — ED Triage Notes (Signed)
Pt to ER for continued pain to neck and shoulder. States was here last weekend and diagnosed with cervical radiculopathy.

## 2018-08-08 NOTE — ED Notes (Signed)
Pt verbalized understanding of discharge instructions and denies any further questions at this time.   

## 2018-08-08 NOTE — Discharge Instructions (Signed)
Please read and follow all provided instructions.  Your diagnoses today include:  1. Cervical radiculopathy     Tests performed today include:  Vital signs - see below for your results today  Medications prescribed:   Prednisone - steroid medicine   It is best to take this medication in the morning to prevent sleeping problems. If you are diabetic, monitor your blood sugar closely and stop taking Prednisone if blood sugar is over 300. Take with food to prevent stomach upset.    Robaxin (methocarbamol) - muscle relaxer medication  DO NOT drive or perform any activities that require you to be awake and alert because this medicine can make you drowsy.   Take any prescribed medications only as directed.  Home care instructions:   Follow any educational materials contained in this packet  Please rest, use ice or heat on your back for the next several days  Do not lift, push, pull anything more than 10 pounds for the next week  Follow-up instructions: Please follow-up with your primary care provider in the next 1 week for further evaluation of your symptoms.   Please have your nerve conduction study as directed by orthopedic doctor.  Return instructions:  SEEK IMMEDIATE MEDICAL ATTENTION IF YOU HAVE:  New numbness, tingling, weakness, or problem with the use of your arms or legs  Severe back pain not relieved with medications  Loss control of your bowels or bladder  Increasing pain in any areas of the body (such as chest or abdominal pain)  Shortness of breath, dizziness, or fainting.   Worsening nausea (feeling sick to your stomach), vomiting, fever, or sweats  Any other emergent concerns regarding your health   Additional Information:  Your vital signs today were: BP (!) 151/94 (BP Location: Left Arm)    Pulse 74    Temp 98.9 F (37.2 C) (Oral)    Resp (!) 22    SpO2 100%  If your blood pressure (BP) was elevated above 135/85 this visit, please have this repeated  by your doctor within one month. --------------

## 2018-08-09 ENCOUNTER — Other Ambulatory Visit: Payer: Self-pay | Admitting: Nurse Practitioner

## 2018-08-09 DIAGNOSIS — M5030 Other cervical disc degeneration, unspecified cervical region: Secondary | ICD-10-CM

## 2018-08-09 DIAGNOSIS — M5020 Other cervical disc displacement, unspecified cervical region: Secondary | ICD-10-CM

## 2018-08-09 NOTE — Telephone Encounter (Signed)
She has a bulging disc in her neck and has been referred to Neurosurgery. She will need to come in to the lab by Wednesday and have a urine drug screen performed prior to me prescribing any pain medication.

## 2018-08-11 NOTE — Telephone Encounter (Signed)
CMA attempt to reach patient to inform on PCP advising.  No answer and left a VM for patient.

## 2018-08-14 ENCOUNTER — Encounter (INDEPENDENT_AMBULATORY_CARE_PROVIDER_SITE_OTHER): Payer: Self-pay | Admitting: Physical Medicine and Rehabilitation

## 2018-08-14 ENCOUNTER — Other Ambulatory Visit (INDEPENDENT_AMBULATORY_CARE_PROVIDER_SITE_OTHER): Payer: Self-pay

## 2018-08-14 ENCOUNTER — Telehealth (INDEPENDENT_AMBULATORY_CARE_PROVIDER_SITE_OTHER): Payer: Self-pay | Admitting: Orthopaedic Surgery

## 2018-08-14 ENCOUNTER — Ambulatory Visit (INDEPENDENT_AMBULATORY_CARE_PROVIDER_SITE_OTHER): Payer: BLUE CROSS/BLUE SHIELD | Admitting: Physical Medicine and Rehabilitation

## 2018-08-14 DIAGNOSIS — M5412 Radiculopathy, cervical region: Secondary | ICD-10-CM | POA: Diagnosis not present

## 2018-08-14 DIAGNOSIS — R202 Paresthesia of skin: Secondary | ICD-10-CM

## 2018-08-14 DIAGNOSIS — M65341 Trigger finger, right ring finger: Secondary | ICD-10-CM

## 2018-08-14 DIAGNOSIS — M79641 Pain in right hand: Secondary | ICD-10-CM

## 2018-08-14 DIAGNOSIS — M961 Postlaminectomy syndrome, not elsewhere classified: Secondary | ICD-10-CM

## 2018-08-14 MED ORDER — PREDNISONE 20 MG PO TABS: ORAL_TABLET | ORAL | 0 refills | Status: DC

## 2018-08-14 NOTE — Progress Notes (Signed)
Numeric Pain Rating Scale and Functional Assessment Average Pain 7   In the last MONTH (on 0-10 scale) has pain interfered with the following?  1. General activity like being  able to carry out your everyday physical activities such as walking, climbing stairs, carrying groceries, or moving a chair?  Rating(7)   

## 2018-08-14 NOTE — Telephone Encounter (Signed)
yes

## 2018-08-14 NOTE — Telephone Encounter (Signed)
Please advise 

## 2018-08-14 NOTE — Telephone Encounter (Signed)
Patient called back and stated that she uses [facility] on [address].  Thank you.

## 2018-08-14 NOTE — Telephone Encounter (Signed)
Rx sent in to [facility].

## 2018-08-14 NOTE — Telephone Encounter (Signed)
Left message on patient's voice mail to call back with the name of which pharmacy to which she would like [place] to send the Rx for Prednisone.

## 2018-08-17 NOTE — Progress Notes (Signed)
Lauren [name] - 52 y.o. female MRN [id number]  [name] of birth: 1965/12/19  Office Visit Note: Visit [name]: 08/14/2018 PCP: Gildardo Pounds, NP Referred by: Gildardo Pounds, NP  Subjective: Chief Complaint  Patient presents with  . Neck - Pain  . Right Arm - Pain  . Right Hand - Pain   HPI: Lauren [name] is a 52 y.o. female who comes in today Evaluation and management and possible electrodiagnostic study is requested by Dr. Eduard Roux.  She is a right-hand-dominant female with 6-year history of right hand pain with paresthesia.  She is status post right carpal tunnel release several years ago by physician in [place].  She reports no relief of symptoms after the surgery.  She reports electrodiagnostic study but we do not have that for review.  She does report trigger finger ring of the right ring finger.  She describes pain in the neck that radiates her refers to the right arm and right hand particularly the ring finger but really the whole hand when you ask her in detail.  The ring finger really locks up in his trigger finger at this point.  She reports over the last few weeks there has been significant worsening of her symptoms in fact she has been to the emergency department twice.  One time she went because the pain referred into the anterior superior chest wall and she thought she was having a heart attack and this was ruled out.  She reports no specific injury.  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  She denies any left-sided complaints.  She reports worsening with using her right hand.  She has had some relief with prednisone which has helped the pain.  She has had prior cervical surgery.  She has had CT scan of the cervical spine which is reviewed below that does show disc protrusion herniation at C6-7 below anterior cervical fusion at C5-6.  She has not had recent cervical MRI.  Cervical surgery was also performed in [place].  She denies any focal weakness or  bowel bladder difficulty or balance difficulty.  She has had no fever chills or night sweats.  She rates her pain as a 7 out of 10 and decreased function.  Review of Systems  Constitutional: Negative for chills, fever, malaise/fatigue and weight loss.  HENT: Negative for hearing loss and sinus pain.   Eyes: Negative for blurred vision, double vision and photophobia.  Respiratory: Negative for cough and shortness of breath.   Cardiovascular: Negative for chest pain, palpitations and leg swelling.  Gastrointestinal: Negative for abdominal pain, nausea and vomiting.  Genitourinary: Negative for flank pain.  Musculoskeletal: Positive for joint pain and neck pain. Negative for myalgias.  Skin: Negative for itching and rash.  Neurological: Positive for tingling. Negative for tremors, focal weakness and weakness.  Endo/Heme/Allergies: Negative.   Psychiatric/Behavioral: Negative for depression.  All other systems reviewed and are negative.  Otherwise per HPI.  Assessment & Plan: Visit Diagnoses:  1. Paresthesia of skin   2. Cervical radiculopathy   3. Pain in right hand   4. Trigger finger, right ring finger   5. Post laminectomy syndrome     Plan:  Impression: Chronic worsening right neck and arm pain with paresthesias of the hand with concomitant trigger finger of the fourth digit on the right.  She is status post carpal tunnel release that did not help.  Without prior electrodiagnostic study hard to know  what she had in terms of median neuropathy.  As below the electrodiagnostic study was normal today.  That does not rule out a cervical radiculitis or sensory radiculopathy.  Cervical CT scan does show something below the fusion.  As below would recommend cervical MRI.  I would favor the diagnosis of cervical radiculitis at this point.  Essentially NORMAL electrodiagnostic study of the right upper limb.   There is no significant electrodiagnostic evidence of nerve entrapment, brachial  plexopathy or cervical radiculopathy.    **As you know, purely sensory or demyelinating radiculopathies and chemical radiculitis may not be detected with this particular electrodiagnostic study.  Recommendations: 1.  Follow-up with referring physician. 2.  Continue current management of symptoms.  Suggest MRI of the cervical spine and possibility of diagnostic and therapeutic cervical epidural injection.  Consider spine surgery evaluation.   Meds & Orders: No orders of the defined types were placed in this encounter.   Orders Placed This Encounter  Procedures  . NCV with EMG (electromyography)    Follow-up: Return for  Eduard Roux, M.D..   Procedures: No procedures performed  EMG & NCV Findings: All nerve conduction studies (as indicated in the following tables) were within normal limits.    All examined muscles (as indicated in the following table) showed no evidence of electrical instability.    Impression: Essentially NORMAL electrodiagnostic study of the right upper limb.   There is no significant electrodiagnostic evidence of nerve entrapment, brachial plexopathy or cervical radiculopathy.    **As you know, purely sensory or demyelinating radiculopathies and chemical radiculitis may not be detected with this particular electrodiagnostic study.  Recommendations: 1.  Follow-up with referring physician. 2.  Continue current management of symptoms.  Suggest MRI of the cervical spine and possibility of diagnostic and therapeutic cervical epidural injection.  Consider spine surgery evaluation.  ___________________________ Laurence Spates FAAPMR Board Certified, American Board of Physical Medicine and Rehabilitation    Nerve Conduction Studies Anti Sensory Summary Table   Stim Site NR Peak (ms) Norm Peak (ms) P-T Amp (V) Norm P-T Amp Site1 Site2 Delta-P (ms) Dist (cm) Vel (m/s) Norm Vel (m/s)  Right Median Acr Palm Anti Sensory (2nd Digit)  33C  Wrist    3.5 <3.6 29.4 >10  Wrist Palm 2.0 0.0    Palm    1.5 <2.0 17.6         Right Radial Anti Sensory (Base 1st Digit)  31.7C  Wrist    2.0 <3.1 33.1  Wrist Base 1st Digit 2.0 0.0    Right Ulnar Anti Sensory (5th Digit)  32.6C  Wrist    3.0 <3.7 31.9 >15.0 Wrist 5th Digit 3.0 14.0 47 >38   Motor Summary Table   Stim Site NR Onset (ms) Norm Onset (ms) O-P Amp (mV) Norm O-P Amp Site1 Site2 Delta-0 (ms) Dist (cm) Vel (m/s) Norm Vel (m/s)  Right Median Motor (Abd Poll Brev)  31.6C  Wrist    3.5 <4.2 8.8 >5 Elbow Wrist 3.8 20.0 53 >50  Elbow    7.3  4.8         Right Ulnar Motor (Abd Dig Min)  31.5C  Wrist    2.9 <4.2 13.4 >3 B Elbow Wrist 2.8 19.0 68 >53  B Elbow    5.7  13.1  A Elbow B Elbow 1.2 10.0 83 >53  A Elbow    6.9  13.2          EMG   Side Muscle Nerve Root Ins  Act Fibs Psw Amp Dur Poly Recrt Int Fraser Din Comment  Right 1stDorInt Ulnar C8-T1 Nml Nml Nml Nml Nml 0 Nml Nml   Right Abd Poll Brev Median C8-T1 Nml Nml Nml Nml Nml 0 Nml Nml   Right ExtDigCom   Nml Nml Nml Nml Nml 0 Nml Nml   Right Triceps Radial C6-7-8 Nml Nml Nml Nml Nml 0 Nml Nml   Right Deltoid Axillary C5-6 Nml Nml Nml Nml Nml 0 Nml Nml     Nerve Conduction Studies Anti Sensory Left/Right Comparison   Stim Site L Lat (ms) R Lat (ms) L-R Lat (ms) L Amp (V) R Amp (V) L-R Amp (%) Site1 Site2 L Vel (m/s) R Vel (m/s) L-R Vel (m/s)  Median Acr Palm Anti Sensory (2nd Digit)  33C  Wrist  3.5   29.4  Wrist Palm     Palm  1.5   17.6        Radial Anti Sensory (Base 1st Digit)  31.7C  Wrist  2.0   33.1  Wrist Base 1st Digit     Ulnar Anti Sensory (5th Digit)  32.6C  Wrist  3.0   31.9  Wrist 5th Digit  47    Motor Left/Right Comparison   Stim Site L Lat (ms) R Lat (ms) L-R Lat (ms) L Amp (mV) R Amp (mV) L-R Amp (%) Site1 Site2 L Vel (m/s) R Vel (m/s) L-R Vel (m/s)  Median Motor (Abd Poll Brev)  31.6C  Wrist  3.5   8.8  Elbow Wrist  53   Elbow  7.3   4.8        Ulnar Motor (Abd Dig Min)  31.5C  Wrist  2.9   13.4  B Elbow Wrist   68   B Elbow  5.7   13.1  A Elbow B Elbow  83   A Elbow  6.9   13.2           Waveforms:            Clinical History: CT CERVICAL SPINE WITHOUT CONTRAST  TECHNIQUE: Multidetector CT imaging of the cervical spine was performed without intravenous contrast. Multiplanar CT image reconstructions were also generated.  COMPARISON:  09/22/2016  FINDINGS: Alignment: Alignment is within normal limits.  Skull base and vertebrae: 7 cervical segments are well visualized. Prior fusion at C5-6 with anterior fixation is seen. No acute fracture or acute facet abnormality is noted.  Soft tissues and spinal canal: No soft tissue abnormality is noted.  Disc levels: Some disc bulging is noted eccentric to the right at C6-7. This is consistent with the patient's given clinical history.  Upper chest: Negative.  Other: None  IMPRESSION: Multilevel degenerative change without acute bony abnormality.  Disc bulging eccentric to the right at C6-7. Nonemergent MRI is recommended for further evaluation.   Electronically Signed   By: Inez Catalina M.D.   On: 08/01/2018 11:40   She reports that she has been smoking cigarettes. She has been smoking about 0.50 packs per day. She has never used smokeless tobacco.  Recent Labs    08/25/17 1641 05/04/18 1621  HGBA1C 5.9 5.8*    Objective:  VS:  HT:    WT:   BMI:     BP:   HR: bpm  TEMP: ( )  RESP:  Physical Exam  Constitutional: She is oriented to person, place, and time. She appears well-developed and well-nourished.  Eyes: Pupils are equal, round, and reactive to light. Conjunctivae and EOM  are normal.  Cardiovascular: Normal rate and intact distal pulses.  Pulmonary/Chest: Effort normal.  Musculoskeletal:  Inspection reveals well-healed anterior cervical surgical scars as well as well-healed right carpal tunnel release scar.  She has good range of motion of the cervical spine with equivocal Spurling's on the right.   She has some trigger points of the trapezius and levator scapula.  No shoulder impingement.  She has good strength in the upper extremities bilaterally.  Inspection reveals no atrophy of the bilateral APB or FDI or hand intrinsics. There is no swelling, color changes, allodynia or dystrophic changes. There is 5 out of 5 strength in the bilateral wrist extension, finger abduction and long finger flexion. There is intact sensation to light touch in all dermatomal and peripheral nerve distributions.There is a negative Phalen's test bilaterally. There is a negative Hoffmann's test bilaterally.  Neurological: She is alert and oriented to person, place, and time. She exhibits normal muscle tone. Coordination normal.  Skin: Skin is warm and dry. No rash noted. No erythema.  Psychiatric: She has a normal mood and affect. Her behavior is normal.  Nursing note and vitals reviewed.   Ortho Exam Imaging: No results found.  Past Medical/Family/Surgical/Social History: Medications & Allergies reviewed per EMR, new medications updated. Patient Active Problem List   Diagnosis [name] Noted  . Pain in right hand 07/28/2018  . Trigger finger, right ring finger 07/28/2018  . Mixed hyperlipidemia 06/19/2017  . Gastroesophageal reflux disease 04/15/2017  . OSA (obstructive sleep apnea) 04/15/2017  . Depression 11/28/2016   Past Medical History:  Diagnosis [name]  . Depression   . Prediabetes    Family History  Problem Relation Age of Onset  . Diabetes Father    Past Surgical History:  Procedure Laterality [name]  . ABDOMINAL HYSTERECTOMY    . BACK SURGERY    . CESAREAN SECTION    . HAND SURGERY    . NECK SURGERY  2002   plate in her neck   Social History   Occupational History  . Not on file  Tobacco Use  . Smoking status: Current Every Day Smoker    Packs/day: 0.50    Types: Cigarettes  . Smokeless tobacco: Never Used  Substance and Sexual Activity  . Alcohol use: No  . Drug use: No  . Sexual  activity: Not Currently    Birth control/protection: None

## 2018-08-17 NOTE — Procedures (Signed)
EMG & NCV Findings: All nerve conduction studies (as indicated in the following tables) were within normal limits.    All examined muscles (as indicated in the following table) showed no evidence of electrical instability.    Impression: Essentially NORMAL electrodiagnostic study of the right upper limb.   There is no significant electrodiagnostic evidence of nerve entrapment, brachial plexopathy or cervical radiculopathy.    **As you know, purely sensory or demyelinating radiculopathies and chemical radiculitis may not be detected with this particular electrodiagnostic study.  Recommendations: 1.  Follow-up with referring physician. 2.  Continue current management of symptoms.  Suggest MRI of the cervical spine and possibility of diagnostic and therapeutic cervical epidural injection.  Consider spine surgery evaluation.  ___________________________ Laurence Spates FAAPMR Board Certified, American Board of Physical Medicine and Rehabilitation    Nerve Conduction Studies Anti Sensory Summary Table   Stim Site NR Peak (ms) Norm Peak (ms) P-T Amp (V) Norm P-T Amp Site1 Site2 Delta-P (ms) Dist (cm) Vel (m/s) Norm Vel (m/s)  Right Median Acr Palm Anti Sensory (2nd Digit)  33C  Wrist    3.5 <3.6 29.4 >10 Wrist Palm 2.0 0.0    Palm    1.5 <2.0 17.6         Right Radial Anti Sensory (Base 1st Digit)  31.7C  Wrist    2.0 <3.1 33.1  Wrist Base 1st Digit 2.0 0.0    Right Ulnar Anti Sensory (5th Digit)  32.6C  Wrist    3.0 <3.7 31.9 >15.0 Wrist 5th Digit 3.0 14.0 47 >38   Motor Summary Table   Stim Site NR Onset (ms) Norm Onset (ms) O-P Amp (mV) Norm O-P Amp Site1 Site2 Delta-0 (ms) Dist (cm) Vel (m/s) Norm Vel (m/s)  Right Median Motor (Abd Poll Brev)  31.6C  Wrist    3.5 <4.2 8.8 >5 Elbow Wrist 3.8 20.0 53 >50  Elbow    7.3  4.8         Right Ulnar Motor (Abd Dig Min)  31.5C  Wrist    2.9 <4.2 13.4 >3 B Elbow Wrist 2.8 19.0 68 >53  B Elbow    5.7  13.1  A Elbow B Elbow 1.2 10.0 83  >53  A Elbow    6.9  13.2          EMG   Side Muscle Nerve Root Ins Act Fibs Psw Amp Dur Poly Recrt Int Fraser Din Comment  Right 1stDorInt Ulnar C8-T1 Nml Nml Nml Nml Nml 0 Nml Nml   Right Abd Poll Brev Median C8-T1 Nml Nml Nml Nml Nml 0 Nml Nml   Right ExtDigCom   Nml Nml Nml Nml Nml 0 Nml Nml   Right Triceps Radial C6-7-8 Nml Nml Nml Nml Nml 0 Nml Nml   Right Deltoid Axillary C5-6 Nml Nml Nml Nml Nml 0 Nml Nml     Nerve Conduction Studies Anti Sensory Left/Right Comparison   Stim Site L Lat (ms) R Lat (ms) L-R Lat (ms) L Amp (V) R Amp (V) L-R Amp (%) Site1 Site2 L Vel (m/s) R Vel (m/s) L-R Vel (m/s)  Median Acr Palm Anti Sensory (2nd Digit)  33C  Wrist  3.5   29.4  Wrist Palm     Palm  1.5   17.6        Radial Anti Sensory (Base 1st Digit)  31.7C  Wrist  2.0   33.1  Wrist Base 1st Digit     Ulnar Anti Sensory (5th Digit)  32.6C  Wrist  3.0   31.9  Wrist 5th Digit  47    Motor Left/Right Comparison   Stim Site L Lat (ms) R Lat (ms) L-R Lat (ms) L Amp (mV) R Amp (mV) L-R Amp (%) Site1 Site2 L Vel (m/s) R Vel (m/s) L-R Vel (m/s)  Median Motor (Abd Poll Brev)  31.6C  Wrist  3.5   8.8  Elbow Wrist  53   Elbow  7.3   4.8        Ulnar Motor (Abd Dig Min)  31.5C  Wrist  2.9   13.4  B Elbow Wrist  68   B Elbow  5.7   13.1  A Elbow B Elbow  83   A Elbow  6.9   13.2           Waveforms:

## 2018-08-18 ENCOUNTER — Ambulatory Visit: Payer: BLUE CROSS/BLUE SHIELD | Attending: Nurse Practitioner | Admitting: Nurse Practitioner

## 2018-08-18 ENCOUNTER — Encounter: Payer: Self-pay | Admitting: Nurse Practitioner

## 2018-08-18 VITALS — BP 133/81 | HR 89 | Temp 98.5°F | Ht 62.0 in | Wt 158.0 lb

## 2018-08-18 DIAGNOSIS — F339 Major depressive disorder, recurrent, unspecified: Secondary | ICD-10-CM | POA: Diagnosis not present

## 2018-08-18 DIAGNOSIS — N760 Acute vaginitis: Secondary | ICD-10-CM | POA: Insufficient documentation

## 2018-08-18 DIAGNOSIS — Z79899 Other long term (current) drug therapy: Secondary | ICD-10-CM | POA: Insufficient documentation

## 2018-08-18 DIAGNOSIS — I10 Essential (primary) hypertension: Secondary | ICD-10-CM | POA: Insufficient documentation

## 2018-08-18 DIAGNOSIS — R7303 Prediabetes: Secondary | ICD-10-CM | POA: Insufficient documentation

## 2018-08-18 DIAGNOSIS — K219 Gastro-esophageal reflux disease without esophagitis: Secondary | ICD-10-CM | POA: Insufficient documentation

## 2018-08-18 DIAGNOSIS — R1013 Epigastric pain: Secondary | ICD-10-CM

## 2018-08-18 LAB — POCT URINALYSIS DIP (CLINITEK)
BILIRUBIN UA: NEGATIVE mg/dL
Bilirubin, UA: NEGATIVE
Blood, UA: NEGATIVE
Glucose, UA: NEGATIVE mg/dL
LEUKOCYTES UA: NEGATIVE
Nitrite, UA: NEGATIVE
PH UA: 7 (ref 5.0–8.0)
PROTEIN: NEGATIVE
Spec Grav, UA: 1.015 (ref 1.010–1.025)
Urobilinogen, UA: 0.2 E.U./dL

## 2018-08-18 LAB — GLUCOSE, POCT (MANUAL RESULT ENTRY): POC GLUCOSE: 144 mg/dL — AB (ref 70–99)

## 2018-08-18 MED ORDER — OMEPRAZOLE 20 MG PO CPDR: 20.0000 mg | DELAYED_RELEASE_CAPSULE | Freq: Every day | ORAL | 3 refills | Status: DC

## 2018-08-18 MED ORDER — LISINOPRIL 10 MG PO TABS: 10.0000 mg | ORAL_TABLET | Freq: Every day | ORAL | 3 refills | Status: DC

## 2018-08-18 MED ORDER — IBUPROFEN 800 MG PO TABS: 800.0000 mg | ORAL_TABLET | Freq: Every day | ORAL | 1 refills | Status: DC | PRN

## 2018-08-18 MED ORDER — MISC. DEVICES MISC: 0 refills | Status: DC

## 2018-08-18 MED ORDER — VENLAFAXINE HCL ER 150 MG PO CP24: 150.0000 mg | ORAL_CAPSULE | ORAL | 1 refills | Status: DC

## 2018-08-18 NOTE — Progress Notes (Signed)
Assessment & Plan:  Lauren [name] was seen today for follow-up.  Diagnoses and all orders for this visit:  Essential hypertension -     lisinopril (PRINIVIL,ZESTRIL) 10 MG tablet; Take 1 tablet (10 mg total) by mouth daily. Continue all antihypertensives as prescribed.  Remember to bring in your blood pressure log with you for your follow up appointment.  DASH/Mediterranean Diets are healthier choices for HTN.    Prediabetes Well controlled with diet.  -     Glucose (CBG) Continue blood sugar control as discussed in office today, low carbohydrate diet, and regular physical exercise as tolerated, 150 minutes per week (30 min each day, 5 days per week, or 50 min 3 days per week). Lab Results  Component Value Date   HGBA1C 5.8 (H) 05/04/2018   Recurrent major depressive disorder, remission status unspecified (HCC) -     venlafaxine XR (EFFEXOR-XR) 150 MG 24 hr capsule; Take 1 capsule (150 mg total) by mouth every morning.  Epigastric pain -     omeprazole (PRILOSEC) 20 MG capsule; Take 1 capsule (20 mg total) by mouth daily. INSTRUCTIONS: Avoid GERD Triggers: acidic, spicy or fried foods, caffeine, coffee, sodas,  alcohol and chocolate.   Acute vaginitis -     Urine cytology ancillary only -     POCT URINALYSIS DIP (CLINITEK)      Patient has been counseled on age-appropriate routine health concerns for screening and prevention. These are reviewed and up-to-date. Referrals have been placed accordingly. Immunizations are up-to-date or declined.    Subjective:   Chief Complaint  Patient presents with  . Follow-up    Pt. is here for follow-up. Pt. stated the Prilosec is helping her. Pt. see an orthpedic for her right hand pain.    HPI Lauren [name] 52 y.o. female presents to office today for for follow up to GERD.   GERD Symptoms controlled with Prilosec 20mg  daily. She denies anychest pain, choking on food, deep pressure at base of neck, difficulty swallowing, dysphagia,  hematemesis, melena, midespigastric pain, regurgitation of undigested food, symptoms primarily relate to meals, and lying down after meals, upper abdominal discomfort and waterbrash.  She has not lost weight. She denies melena, hematochezia, hematemesis, and coffee ground emesis. Medical therapy in the past has included antacids and H2 antagonists.  Vaginitis Patient complains of an abnormal vaginal discharge for several days. Vaginal symptoms include local irritation and vulvar itchingSTI Risk: Very low risk of STD exposure. Discharge described as: normal and physiologic. Other associated symptoms: none. Menstrual pattern: NONE. Hysterectomy.  Contraception: abstinence.  CHRONIC HYPERTENSION Disease Monitoring  Blood pressure range BP Readings from Last 3 Encounters:  08/18/18 133/81  08/08/18 (!) 151/94  08/01/18 (!) 147/85  Stable and well controlled.   Chest pain: no   Dyspnea: no   Claudication: no  Medication compliance: yes, taking lisinopril 10mg  daily Medication Side Effects  Lightheadedness: no   Urinary frequency: no   Edema: no   Impotence: no  Preventitive Healthcare:  Exercise: no   Diet Pattern: diet: general  Salt Restriction:  no  Review of Systems  Constitutional: Negative for fever, malaise/fatigue and weight loss.  HENT: Negative.  Negative for nosebleeds.   Eyes: Negative.  Negative for blurred vision, double vision and photophobia.  Respiratory: Negative.  Negative for cough and shortness of breath.   Cardiovascular: Negative.  Negative for chest pain, palpitations and leg swelling.  Gastrointestinal: Positive for heartburn. Negative for nausea and vomiting.  Genitourinary:  SEE HPI  Musculoskeletal: Negative.  Negative for myalgias.  Neurological: Negative.  Negative for dizziness, focal weakness, seizures and headaches.  Psychiatric/Behavioral: Positive for depression. Negative for suicidal ideas.    Past Medical History:  Diagnosis Date  .  Depression   . Prediabetes     Past Surgical History:  Procedure Laterality Date  . ABDOMINAL HYSTERECTOMY    . BACK SURGERY    . CESAREAN SECTION    . HAND SURGERY    . NECK SURGERY  2002   plate in her neck    Family History  Problem Relation Age of Onset  . Diabetes Father     Social History Reviewed with no changes to be made today.   Outpatient Medications Prior to Visit  Medication Sig Dispense Refill  . predniSONE (DELTASONE) 20 MG tablet 3 Tabs PO Days 1-3, then 2 tabs PO Days 4-6, then 1 tab PO Day 7-9, then Half Tab PO Day 10-12 20 tablet 0  . lisinopril (PRINIVIL,ZESTRIL) 10 MG tablet Take 1 tablet (10 mg total) by mouth daily. 90 tablet 3  . omeprazole (PRILOSEC) 20 MG capsule Take 1 capsule (20 mg total) by mouth daily. 90 capsule 3  . venlafaxine XR (EFFEXOR-XR) 150 MG 24 hr capsule Take 1 capsule (150 mg total) by mouth every morning. 90 capsule 1  . acetaminophen (TYLENOL) 500 MG tablet Take 1 tablet (500 mg total) every 6 (six) hours as needed by mouth. 30 tablet 0  . acyclovir (ZOVIRAX) 800 MG tablet Take 800 mg by mouth 5 (five) times daily.  0  . HYDROcodone-acetaminophen (NORCO/VICODIN) 5-325 MG tablet Take 1 tablet by mouth 4 (four) times daily as needed for pain.  0  . ibuprofen (ADVIL,MOTRIN) 800 MG tablet Take 800 mg by mouth daily as needed (hand pain).    . methocarbamol (ROBAXIN) 500 MG tablet Take 2 tablets (1,000 mg total) by mouth at bedtime as needed for muscle spasms. 20 tablet 0   No facility-administered medications prior to visit.     No Known Allergies     Objective:    BP 133/81 (BP Location: Left Arm, Patient Position: Sitting, Cuff Size: Normal)   Pulse 89   Temp 98.5 F (36.9 C) (Oral)   Ht 5\' 2"  (1.575 m)   Wt 158 lb (71.7 kg)   SpO2 98%   BMI 28.90 kg/m  Wt Readings from Last 3 Encounters:  08/18/18 158 lb (71.7 kg)  07/17/18 156 lb 9.6 oz (71 kg)  06/09/18 155 lb 9.6 oz (70.6 kg)    Physical Exam  Constitutional: She  is oriented to person, place, and time. She appears well-developed and well-nourished. She is cooperative.  HENT:  Head: Normocephalic and atraumatic.  Eyes: EOM are normal.  Neck: Normal range of motion.  Cardiovascular: Normal rate, regular rhythm and normal heart sounds. Exam reveals no gallop and no friction rub.  No murmur heard. Pulmonary/Chest: Effort normal and breath sounds normal. No tachypnea. No respiratory distress. She has no decreased breath sounds. She has no wheezes. She has no rhonchi. She has no rales. She exhibits no tenderness.  Abdominal: Bowel sounds are normal. She exhibits no distension and no mass. There is no tenderness. There is no rebound and no guarding. No hernia.  Musculoskeletal: Normal range of motion. She exhibits no edema.  Neurological: She is alert and oriented to person, place, and time. Coordination normal.  Skin: Skin is warm and dry.  Psychiatric: She has a normal mood and affect.  Her behavior is normal. Judgment and thought content normal.  Nursing note and vitals reviewed.        Patient has been counseled extensively about nutrition and exercise as well as the importance of adherence with medications and regular follow-up. The patient was given clear instructions to go to ER or return to medical center if symptoms don't improve, worsen or new problems develop. The patient verbalized understanding.   Follow-up: Return in about 3 months (around 11/18/2018).   Gildardo Pounds, FNP-BC [facility] [place], [place]   08/19/2018, 1:34 PM

## 2018-08-19 ENCOUNTER — Encounter: Payer: Self-pay | Admitting: Nurse Practitioner

## 2018-08-19 LAB — URINE CYTOLOGY ANCILLARY ONLY
CHLAMYDIA, DNA PROBE: NEGATIVE
NEISSERIA GONORRHEA: NEGATIVE
Trichomonas: NEGATIVE

## 2018-08-21 LAB — URINE CYTOLOGY ANCILLARY ONLY
Bacterial vaginitis: NEGATIVE
Candida vaginitis: NEGATIVE

## 2018-08-25 ENCOUNTER — Ambulatory Visit (INDEPENDENT_AMBULATORY_CARE_PROVIDER_SITE_OTHER): Payer: BLUE CROSS/BLUE SHIELD | Admitting: Orthopaedic Surgery

## 2018-08-25 DIAGNOSIS — M5412 Radiculopathy, cervical region: Secondary | ICD-10-CM
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
## 2018-08-27 ENCOUNTER — Telehealth (INDEPENDENT_AMBULATORY_CARE_PROVIDER_SITE_OTHER): Payer: Self-pay | Admitting: Orthopaedic Surgery

## 2018-08-27 NOTE — Telephone Encounter (Signed)
Please advise on message below concerning PT referral and a Rx for pain.  Thank You

## 2018-08-27 NOTE — Telephone Encounter (Signed)
This is a xu pt.

## 2018-08-27 NOTE — Telephone Encounter (Signed)
Patient called advised she was told by [facility] (PT) that she need a referral. Patient also asked if she can get something prescribed for her for pain. Patient said she uses the [facility] on [address]. The number to contact patient is [phone]

## 2018-08-27 NOTE — Telephone Encounter (Signed)
Tramadol 50-100 mg bid prn pain #30

## 2018-08-28 ENCOUNTER — Other Ambulatory Visit (INDEPENDENT_AMBULATORY_CARE_PROVIDER_SITE_OTHER): Payer: Self-pay

## 2018-08-28 ENCOUNTER — Telehealth (INDEPENDENT_AMBULATORY_CARE_PROVIDER_SITE_OTHER): Payer: Self-pay

## 2018-08-28 MED ORDER — TRAMADOL HCL 50 MG PO TABS: 50.0000 mg | ORAL_TABLET | Freq: Two times a day (BID) | ORAL | 0 refills | Status: DC

## 2018-08-28 NOTE — Telephone Encounter (Signed)
Patient called and left a VM stating that she would like for PT referral to be faxed to [facility] on [address]. CB# is [phone].   Please fax Monday.  Thank You.

## 2018-08-28 NOTE — Telephone Encounter (Signed)
Called in Rx for Tramadol to [facility] on [address], per Dr. Erlinda Hong. Can you please advise on a referral for PT for patient?  See previous message below.  Thank You

## 2018-08-28 NOTE — Telephone Encounter (Signed)
I'll bring you PT rx

## 2018-08-28 NOTE — Telephone Encounter (Signed)
Noted  

## 2018-08-28 NOTE — Telephone Encounter (Signed)
Called and left a VM advising patient that PT referral is ready for pick up or if she wants referral faxed to [facility] therapy to give [place] a call back.

## 2018-08-31 NOTE — Telephone Encounter (Signed)
FAXED. SENT ORIGINAL TO SCAN

## 2018-09-03 ENCOUNTER — Telehealth (INDEPENDENT_AMBULATORY_CARE_PROVIDER_SITE_OTHER): Payer: Self-pay | Admitting: Orthopaedic Surgery

## 2018-09-03 ENCOUNTER — Other Ambulatory Visit (INDEPENDENT_AMBULATORY_CARE_PROVIDER_SITE_OTHER): Payer: Self-pay

## 2018-09-03 DIAGNOSIS — M5412 Radiculopathy, cervical region: Secondary | ICD-10-CM

## 2018-09-03 NOTE — Telephone Encounter (Signed)
Order made in Epic 

## 2018-09-03 NOTE — Telephone Encounter (Signed)
Patient called stating that the Tramadol is not working for her neck pain, she stated that she can barely move her arm today.  She is wanting to know if Dr. Erlinda Hong or Mendel Ryder would put her back on the Prednisone, because that's the only medication that was helping her so that she can get up and go to work.  CB#[phone].  Thank you.

## 2018-09-03 NOTE — Telephone Encounter (Signed)
yes

## 2018-09-03 NOTE — Telephone Encounter (Signed)
See message below °

## 2018-09-03 NOTE — Telephone Encounter (Signed)
Please advise on message below. Thank you!

## 2018-09-03 NOTE — Telephone Encounter (Signed)
Patient called advised the referral for (PT) was not received by [facility] (PT) on [address]. The number to contact patient is [phone]

## 2018-09-04 MED ORDER — PREDNISONE 20 MG PO TABS: ORAL_TABLET | ORAL | 0 refills | Status: DC

## 2018-09-04 NOTE — Telephone Encounter (Signed)
Sent medication to pharmacy. Left voicemail for patient advising.

## 2018-09-06 ENCOUNTER — Emergency Department (HOSPITAL_BASED_OUTPATIENT_CLINIC_OR_DEPARTMENT_OTHER)
Admission: EM | Admit: 2018-09-06 | Discharge: 2018-09-06 | Disposition: A | Discharge status: Home / Self Care | Payer: BLUE CROSS/BLUE SHIELD | Attending: Emergency Medicine | Admitting: Emergency Medicine

## 2018-09-06 ENCOUNTER — Other Ambulatory Visit: Payer: Self-pay

## 2018-09-06 DIAGNOSIS — M5412 Radiculopathy, cervical region: Secondary | ICD-10-CM | POA: Diagnosis not present

## 2018-09-06 DIAGNOSIS — F17210 Nicotine dependence, cigarettes, uncomplicated: Secondary | ICD-10-CM | POA: Diagnosis not present

## 2018-09-06 DIAGNOSIS — Z79899 Other long term (current) drug therapy: Secondary | ICD-10-CM | POA: Diagnosis not present

## 2018-09-06 DIAGNOSIS — M542 Cervicalgia: Secondary | ICD-10-CM | POA: Diagnosis present

## 2018-09-06 DIAGNOSIS — E785 Hyperlipidemia, unspecified: Secondary | ICD-10-CM | POA: Insufficient documentation

## 2018-09-06 DIAGNOSIS — I10 Essential (primary) hypertension: Secondary | ICD-10-CM | POA: Diagnosis not present

## 2018-09-06 MED ORDER — NAPROXEN 500 MG PO TABS: 500.0000 mg | ORAL_TABLET | Freq: Two times a day (BID) | ORAL | 0 refills | Status: DC

## 2018-09-06 MED ORDER — OXYCODONE-ACETAMINOPHEN 5-325 MG PO TABS: 1.0000 | ORAL_TABLET | Freq: Four times a day (QID) | ORAL | 0 refills | Status: DC | PRN

## 2018-09-06 MED ORDER — KETOROLAC TROMETHAMINE 60 MG/2ML IM SOLN
60.0000 mg | Freq: Once | INTRAMUSCULAR | Status: AC
  Administered 2018-09-06: 60 mg via INTRAMUSCULAR
  Filled 2018-09-06: qty 2

## 2018-09-06 NOTE — ED Triage Notes (Signed)
Pt states that she has right arm pain that started a month ago. Stated that a CT scan showed a plated in her neck shifted and is compressing the nerve. She was given prednisone that helped. The orthopedist put her on tramadol which did not help. She asked to be placed back on steroids and has been on prednisone for 3 days without relief this time. She is having trouble sleeping.

## 2018-09-06 NOTE — ED Provider Notes (Signed)
[facility] EMERGENCY DEPARTMENT Provider Note   CSN: [id number] Arrival date & time: 09/06/18  3825     History   Chief Complaint Chief Complaint  Patient presents with  . Arm Pain    HPI Lauren [name] is a 52 y.o. female.  Patient is a 52 year old female with history of cervical disc herniation.  She presents with complaints of pain in her neck radiating to the right arm.  She recently finished up a prednisone taper which has helped little.  She presents tonight complaining of inability to sleep secondary to her pain.  She has been seeing Dr. Phoebe Sharps PA at the orthopedic office and was supposed to have an MRI or physical therapy, however this has not happened yet.  She denies any weakness or numbness.  She denies any bowel or bladder issues.  Her pain is relieved slightly with raising her arm over her head and is worse with movement and position.     Past Medical History:  Diagnosis Date  . Depression   . GERD (gastroesophageal reflux disease)   . Hypertension   . Prediabetes     Patient Active Problem List   Diagnosis Date Noted  . Radiculopathy of cervical spine 08/25/2018  . Pain in right hand 07/28/2018  . Trigger finger, right ring finger 07/28/2018  . Mixed hyperlipidemia 06/19/2017  . Gastroesophageal reflux disease 04/15/2017  . OSA (obstructive sleep apnea) 04/15/2017  . Depression 11/28/2016    Past Surgical History:  Procedure Laterality Date  . ABDOMINAL HYSTERECTOMY    . BACK SURGERY    . CESAREAN SECTION    . HAND SURGERY    . NECK SURGERY  2002   plate in her neck     OB History    Gravida  1   Para      Term      Preterm      AB      Living  1     SAB      TAB      Ectopic      Multiple      Live Births  1            Home Medications    Prior to Admission medications   Medication Sig Start Date End Date Taking? Authorizing Provider  acetaminophen (TYLENOL) 500 MG tablet Take 1 tablet (500 mg total) every 6  (six) hours as needed by mouth. 08/25/17   Alfonse Spruce, FNP  ibuprofen (ADVIL,MOTRIN) 800 MG tablet Take 1 tablet (800 mg total) by mouth daily as needed (hand pain). 08/18/18   Gildardo Pounds, NP  lisinopril (PRINIVIL,ZESTRIL) 10 MG tablet Take 1 tablet (10 mg total) by mouth daily. 08/18/18   Gildardo Pounds, NP  Misc. Devices MISC Please provide patient with insurance approved supplies for her CPAP: tubing, chin strap and supplies and filters. 08/18/18   Gildardo Pounds, NP  omeprazole (PRILOSEC) 20 MG capsule Take 1 capsule (20 mg total) by mouth daily. 08/18/18   Gildardo Pounds, NP  predniSONE (DELTASONE) 20 MG tablet 3 Tabs PO Days 1-3, then 2 tabs PO Days 4-6, then 1 tab PO Day 7-9, then Half Tab PO Day 10-12 09/04/18   Leandrew Koyanagi, MD  traMADol (ULTRAM) 50 MG tablet Take 1 tablet (50 mg total) by mouth 2 (two) times daily. PRN for Pain 08/28/18   Leandrew Koyanagi, MD  venlafaxine XR (EFFEXOR-XR) 150 MG 24 hr capsule Take 1 capsule (  150 mg total) by mouth every morning. 08/18/18   Gildardo Pounds, NP    Family History Family History  Problem Relation Age of Onset  . Diabetes Father     Social History Social History   Tobacco Use  . Smoking status: Current Every Day Smoker    Packs/day: 0.50    Types: Cigarettes  . Smokeless tobacco: Never Used  Substance Use Topics  . Alcohol use: No  . Drug use: No     Allergies   Patient has no known allergies.   Review of Systems Review of Systems  All other systems reviewed and are negative.    Physical Exam Updated Vital Signs BP (!) 176/88   Pulse 95   Temp 98 F (36.7 C)   Resp (!) 22   Ht 5\' 2"  (1.575 m)   Wt 71.7 kg   SpO2 99%   BMI 28.90 kg/m   Physical Exam  Constitutional: She is oriented to person, place, and time. She appears well-developed and well-nourished. No distress.  HENT:  Head: Normocephalic and atraumatic.  Neck: Normal range of motion. Neck supple.  Pulmonary/Chest: Effort normal.    Musculoskeletal:  There is tenderness to palpation in the right lateral lower neck.  There is pain with range of motion of the right shoulder.  Ulnar and radial pulses are palpable.  Strength and sensation is intact throughout the entire hand.  Neurological: She is alert and oriented to person, place, and time.  Skin: Skin is warm and dry. She is not diaphoretic.  Nursing note and vitals reviewed.    ED Treatments / Results  Labs (all labs ordered are listed, but only abnormal results are displayed) Labs Reviewed - No data to display  EKG None  Radiology No results found.  Procedures Procedures (including critical care time)  Medications Ordered in ED Medications - No data to display   Initial Impression / Assessment and Plan / ED Course  I have reviewed the triage vital signs and the nursing notes.  Pertinent labs & imaging results that were available during my care of the patient were reviewed by me and considered in my medical decision making (see chart for details).  Patient presents with pain resulting from a cervical radiculopathy.  It is not improving with prednisone that has been previously prescribed.  Patient will be given Toradol and discharged with Percocet.  She is to follow-up with her orthopedist this week.  There are no new neurologic deficits and strength and motor are intact throughout the entire arm.  Nothing appears emergent.  Final Clinical Impressions(s) / ED Diagnoses   Final diagnoses:  None    ED Discharge Orders    None       Veryl Speak, MD 09/06/18 [id number]

## 2018-09-06 NOTE — ED Notes (Signed)
ED Provider at bedside. 

## 2018-09-06 NOTE — ED Notes (Signed)
Pt understood dc material. NAD noted. Script given at dc  

## 2018-09-06 NOTE — Discharge Instructions (Addendum)
Naproxen as prescribed.  Percocet as prescribed as needed for pain not relieved with naproxen.  Follow-up with neurosurgery.  The contact information for [facility] has been provided in this discharge summary for you to call and make these arrangements.

## 2018-09-07 ENCOUNTER — Encounter (HOSPITAL_BASED_OUTPATIENT_CLINIC_OR_DEPARTMENT_OTHER): Payer: Self-pay | Admitting: *Deleted

## 2018-09-07 ENCOUNTER — Emergency Department (HOSPITAL_BASED_OUTPATIENT_CLINIC_OR_DEPARTMENT_OTHER)
Admission: EM | Admit: 2018-09-07 | Discharge: 2018-09-07 | Disposition: A | Discharge status: Home / Self Care | Payer: BLUE CROSS/BLUE SHIELD | Attending: Emergency Medicine | Admitting: Emergency Medicine

## 2018-09-07 ENCOUNTER — Other Ambulatory Visit: Payer: Self-pay

## 2018-09-07 DIAGNOSIS — I10 Essential (primary) hypertension: Secondary | ICD-10-CM | POA: Diagnosis not present

## 2018-09-07 DIAGNOSIS — Z79899 Other long term (current) drug therapy: Secondary | ICD-10-CM | POA: Insufficient documentation

## 2018-09-07 DIAGNOSIS — M5412 Radiculopathy, cervical region: Secondary | ICD-10-CM | POA: Insufficient documentation

## 2018-09-07 DIAGNOSIS — M542 Cervicalgia: Secondary | ICD-10-CM | POA: Diagnosis present

## 2018-09-07 DIAGNOSIS — R7303 Prediabetes: Secondary | ICD-10-CM | POA: Diagnosis not present

## 2018-09-07 DIAGNOSIS — F17210 Nicotine dependence, cigarettes, uncomplicated: Secondary | ICD-10-CM | POA: Insufficient documentation

## 2018-09-07 MED ORDER — GABAPENTIN 300 MG PO CAPS: 300.0000 mg | ORAL_CAPSULE | Freq: Three times a day (TID) | ORAL | 0 refills | Status: DC

## 2018-09-07 NOTE — ED Notes (Signed)
Pt reports prednisone helped last time. Denies having MRI. Unable to reach neurologist today. No relief with oxycodone and naprosen

## 2018-09-07 NOTE — ED Provider Notes (Signed)
[facility] EMERGENCY DEPARTMENT Provider Note   CSN: [id number] Arrival date & time: 09/07/18  1707     History   Chief Complaint Chief Complaint  Patient presents with  . Neck Pain    HPI Lauren [name] is a 52 y.o. female.  With chronic neck pain with intermittent radiating pain down to her right hand.  Has a history of cervical surgery about 16 years ago and is scheduled to follow-up with neurosurgery due to ongoing cervical radiculopathy.  Patient finished prednisone treatment last week is currently on naproxen and narcotic pain medicine.  Continues to complain of intermittent tingling down her right arm.  Denies any new trauma.  Denies any sensation changes, weakness.  Patient continues to have pain despite 1 day use of naproxen and narcotic.  The history is provided by the patient.  Illness  This is a chronic problem. The current episode started more than 1 week ago. The problem occurs daily. The problem has not changed since onset.Pertinent negatives include no chest pain, no abdominal pain, no headaches and no shortness of breath. Exacerbated by: movement. The symptoms are relieved by NSAIDs. Treatments tried: narcotic, steroids, naproxen. The treatment provided mild relief.    Past Medical History:  Diagnosis Date  . Depression   . GERD (gastroesophageal reflux disease)   . Hypertension   . Prediabetes     Patient Active Problem List   Diagnosis Date Noted  . Radiculopathy of cervical spine 08/25/2018  . Pain in right hand 07/28/2018  . Trigger finger, right ring finger 07/28/2018  . Mixed hyperlipidemia 06/19/2017  . Gastroesophageal reflux disease 04/15/2017  . OSA (obstructive sleep apnea) 04/15/2017  . Depression 11/28/2016    Past Surgical History:  Procedure Laterality Date  . ABDOMINAL HYSTERECTOMY    . BACK SURGERY    . CESAREAN SECTION    . HAND SURGERY    . NECK SURGERY  2002   plate in her neck     OB History    Gravida  1   Para       Term      Preterm      AB      Living  1     SAB      TAB      Ectopic      Multiple      Live Births  1            Home Medications    Prior to Admission medications   Medication Sig Start Date End Date Taking? Authorizing Provider  acetaminophen (TYLENOL) 500 MG tablet Take 1 tablet (500 mg total) every 6 (six) hours as needed by mouth. 08/25/17   Alfonse Spruce, FNP  gabapentin (NEURONTIN) 300 MG capsule Take 1 capsule (300 mg total) by mouth 3 (three) times daily for 14 days. 09/07/18 09/21/18  Fillmore Bynum, DO  ibuprofen (ADVIL,MOTRIN) 800 MG tablet Take 1 tablet (800 mg total) by mouth daily as needed (hand pain). 08/18/18   Gildardo Pounds, NP  lisinopril (PRINIVIL,ZESTRIL) 10 MG tablet Take 1 tablet (10 mg total) by mouth daily. 08/18/18   Gildardo Pounds, NP  Misc. Devices MISC Please provide patient with insurance approved supplies for her CPAP: tubing, chin strap and supplies and filters. 08/18/18   Gildardo Pounds, NP  naproxen (NAPROSYN) 500 MG tablet Take 1 tablet (500 mg total) by mouth 2 (two) times daily. 09/06/18   Veryl Speak, MD  omeprazole (PRILOSEC) 20 MG  capsule Take 1 capsule (20 mg total) by mouth daily. 08/18/18   Gildardo Pounds, NP  oxyCODONE-acetaminophen (PERCOCET) 5-325 MG tablet Take 1-2 tablets by mouth every 6 (six) hours as needed. 09/06/18   Veryl Speak, MD  predniSONE (DELTASONE) 20 MG tablet 3 Tabs PO Days 1-3, then 2 tabs PO Days 4-6, then 1 tab PO Day 7-9, then Half Tab PO Day 10-12 09/04/18   Leandrew Koyanagi, MD  traMADol (ULTRAM) 50 MG tablet Take 1 tablet (50 mg total) by mouth 2 (two) times daily. PRN for Pain 08/28/18   Leandrew Koyanagi, MD  venlafaxine XR (EFFEXOR-XR) 150 MG 24 hr capsule Take 1 capsule (150 mg total) by mouth every morning. 08/18/18   Gildardo Pounds, NP    Family History Family History  Problem Relation Age of Onset  . Diabetes Father     Social History Social History   Tobacco Use  .  Smoking status: Current Every Day Smoker    Packs/day: 0.50    Types: Cigarettes  . Smokeless tobacco: Never Used  Substance Use Topics  . Alcohol use: No  . Drug use: No     Allergies   Patient has no known allergies.   Review of Systems Review of Systems  Constitutional: Negative for chills and fever.  HENT: Negative for ear pain and sore throat.   Eyes: Negative for pain and visual disturbance.  Respiratory: Negative for cough and shortness of breath.   Cardiovascular: Negative for chest pain and palpitations.  Gastrointestinal: Negative for abdominal pain and vomiting.  Genitourinary: Negative for dysuria and hematuria.  Musculoskeletal: Positive for neck pain. Negative for arthralgias, back pain, gait problem, joint swelling, myalgias and neck stiffness.  Skin: Negative for color change and rash.  Neurological: Positive for numbness (tingling down right arm intermittently). Negative for seizures, syncope and headaches.  All other systems reviewed and are negative.    Physical Exam Updated Vital Signs  ED Triage Vitals  Enc Vitals Group     BP 09/07/18 1718 (!) 144/92     Pulse Rate 09/07/18 1718 92     Resp 09/07/18 1718 18     Temp 09/07/18 1718 98.3 F (36.8 C)     Temp Source 09/07/18 1718 Oral     SpO2 09/07/18 1718 98 %     Weight 09/07/18 1719 157 lb 13.6 oz (71.6 kg)     Height 09/07/18 1719 5\' 2"  (1.575 m)     Head Circumference --      Peak Flow --      Pain Score 09/07/18 1719 7     Pain Loc --      Pain Edu? --      Excl. in [place]? --     Physical Exam  Constitutional: She is oriented to person, place, and time. She appears well-developed and well-nourished. No distress.  HENT:  Head: Normocephalic and atraumatic.  Eyes: Conjunctivae are normal.  Neck: Normal range of motion. Neck supple.  No midline spinal pain   Cardiovascular: Normal rate, regular rhythm, normal heart sounds and intact distal pulses.  No murmur heard. Pulmonary/Chest:  Effort normal and breath sounds normal. No respiratory distress.  Abdominal: Soft. There is no tenderness.  Musculoskeletal: Normal range of motion. She exhibits no edema or tenderness.  Neurological: She is alert and oriented to person, place, and time. No cranial nerve deficit or sensory deficit. She exhibits normal muscle tone. Coordination normal.  5+ out of 5 strength throughout  bilateral upper extremities, normal sensation  Skin: Skin is warm and dry.  Psychiatric: She has a normal mood and affect.  Nursing note and vitals reviewed.    ED Treatments / Results  Labs (all labs ordered are listed, but only abnormal results are displayed) Labs Reviewed - No data to display  EKG None  Radiology No results found.  Procedures Procedures (including critical care time)  Medications Ordered in ED Medications - No data to display   Initial Impression / Assessment and Plan / ED Course  I have reviewed the triage vital signs and the nursing notes.  Pertinent labs & imaging results that were available during my care of the patient were reviewed by me and considered in my medical decision making (see chart for details).     Lauren [name] is a 52 year old female with history of chronic neck pain status post cervical surgery 16 years ago who presents to the ED with chronic radiculopathy pain.  Patient with normal vitals.  No fever.  Patient denies any new trauma.  Was seen here yesterday and prescribed narcotic and naproxen.  Patient saw her orthopedic last week and finished steroid treatment.  Patient is scheduled to see neurosurgery.  Patient has intermittent numbness tingling to her right upper extremity that is worse with certain neck movements.  Patient has normal strength and sensation on exam.  No midline spinal tenderness.  Neurologically she is intact.  History and physical is consistent with a cervical radiculopathy.  She states that there is a concern that her hardware may be  pinching on the nerve.  Patient was given prescription for gabapentin and encouraged her to continue naproxen and limit her use on narcotic pain medicine.  Patient was discharged from ED in good condition and told her return to ED if symptoms worsen including weakness, persistent numbness.  However at this time no persistent neurological deficit.  No need for any acute imaging at this time.  This chart was dictated using voice recognition software.  Despite best efforts to proofread,  errors can occur which can change the documentation meaning.    Final Clinical Impressions(s) / ED Diagnoses   Final diagnoses:  Cervical radiculopathy    ED Discharge Orders         Ordered    gabapentin (NEURONTIN) 300 MG capsule  3 times daily     09/07/18 1806           Lennice Sites, DO 09/07/18 1813

## 2018-09-07 NOTE — Discharge Instructions (Signed)
Follow up with neurosurgery as planned

## 2018-09-07 NOTE — ED Triage Notes (Signed)
States she has a pinched nerve in her neck. She was seen here yesterday and given medications. States 2nd and 3rd digits on her right hand are tingling. She can get comfort by raising her arm over her head.

## 2018-09-07 NOTE — ED Notes (Signed)
ED Provider at bedside. 

## 2018-09-14 ENCOUNTER — Telehealth: Payer: Self-pay | Admitting: Nurse Practitioner

## 2018-09-14 NOTE — Telephone Encounter (Signed)
Patient was prescribed Gabapentin during her ED recent.   Will route to PCP for advising.

## 2018-09-14 NOTE — Telephone Encounter (Signed)
Patient called to request a higher dosage of her gabapentin.Please follow up.  Patient would like prescription sent to [facility] on [address].

## 2018-09-16 ENCOUNTER — Other Ambulatory Visit: Payer: Self-pay | Admitting: Nurse Practitioner

## 2018-09-16 DIAGNOSIS — M5412 Radiculopathy, cervical region: Secondary | ICD-10-CM

## 2018-09-16 MED ORDER — GABAPENTIN 400 MG PO CAPS: 400.0000 mg | ORAL_CAPSULE | Freq: Three times a day (TID) | ORAL | 0 refills | Status: DC

## 2018-09-16 NOTE — Telephone Encounter (Signed)
Prescription has been sent.

## 2018-09-23 NOTE — Telephone Encounter (Signed)
CMA attempt to reach patient to inform her Gabapentin was sent to [facility] [address] [facility]. No answer and left a VM for patient.

## 2018-09-24 NOTE — Telephone Encounter (Signed)
Patient called back stating that she has been taking  -gabapentin (NEURONTIN) 400 MG capsule  For a week now,  but she is still in pain. she was seen at [facility] but is having trouble getting an MRI.  Please advise

## 2018-10-06 ENCOUNTER — Other Ambulatory Visit: Payer: Self-pay | Admitting: Nurse Practitioner

## 2018-10-06 MED ORDER — AMITRIPTYLINE HCL 75 MG PO TABS: 75.0000 mg | ORAL_TABLET | Freq: Every day | ORAL | 3 refills | Status: DC

## 2018-10-06 NOTE — Telephone Encounter (Signed)
I have sent elavil for pain relief however she can not take elavil and tramadol.

## 2018-10-07 NOTE — Telephone Encounter (Signed)
Left message on voicemail to return call.

## 2018-10-16 ENCOUNTER — Telehealth: Payer: Self-pay | Admitting: Nurse Practitioner

## 2018-10-16 NOTE — Telephone Encounter (Signed)
Patient returned call to speak with you. Please follow up.

## 2018-10-16 NOTE — Telephone Encounter (Signed)
After several attempts to inform patient of message regarding Elavil and Tramadol, pt called back.  She was informed that she could not take them both and Elavil was sent to pharmacy.   Pt replied she would rather take Tramadol for the pain.

## 2018-10-16 NOTE — Telephone Encounter (Signed)
Left message on voicemail to return call.

## 2018-10-18 NOTE — Telephone Encounter (Signed)
She will need to speak to Ortho regarding tramadol as they were prescribing this medication.

## 2018-10-19 NOTE — Telephone Encounter (Signed)
Left message on voicemail to return call.

## 2018-11-17 ENCOUNTER — Encounter: Payer: Self-pay | Admitting: Nurse Practitioner

## 2018-11-17 ENCOUNTER — Ambulatory Visit: Payer: Self-pay | Attending: Nurse Practitioner | Admitting: Nurse Practitioner

## 2018-11-17 VITALS — BP 119/76 | HR 91 | Temp 98.9°F | Resp 18 | Ht 62.0 in | Wt 157.0 lb

## 2018-11-17 DIAGNOSIS — M79641 Pain in right hand: Secondary | ICD-10-CM

## 2018-11-17 DIAGNOSIS — I10 Essential (primary) hypertension: Secondary | ICD-10-CM

## 2018-11-17 DIAGNOSIS — G8929 Other chronic pain: Secondary | ICD-10-CM

## 2018-11-17 MED ORDER — IBUPROFEN 800 MG PO TABS: 800.0000 mg | ORAL_TABLET | Freq: Every day | ORAL | 1 refills | Status: DC | PRN

## 2018-11-17 MED ORDER — MISC. DEVICES MISC: 0 refills | Status: AC

## 2018-11-17 NOTE — Progress Notes (Signed)
Assessment & Plan:  Lauren [name] was seen today for follow-up.  Diagnoses and all orders for this visit:  Essential hypertension Continue all antihypertensives as prescribed.  Remember to bring in your blood pressure log with you for your follow up appointment.  DASH/Mediterranean Diets are healthier choices for HTN.    Chronic hand pain, right -     ibuprofen (ADVIL,MOTRIN) 800 MG tablet; Take 1 tablet (800 mg total) by mouth daily as needed (hand pain).  Other orders -     Misc. Devices MISC; Please provide patient with insurance approved supplies for her CPAP: tubing, chin strap and supplies and filters.    Patient has been counseled on age-appropriate routine health concerns for screening and prevention. These are reviewed and up-to-date. Referrals have been placed accordingly. Immunizations are up-to-date or declined.    Subjective:   Chief Complaint  Patient presents with  . Follow-up    HTN   HPI Lauren [name] 53 y.o. female presents to office today for follow up to HTN. She states she recently lost her insurance and her housing and is currently residing in a homeless shelter. Patient has been advised to apply for financial assistance and schedule to see our financial counselor. She states she has a new job "lined up" and someone is helping her find a place to live and will be placing the deposit down for next month.   Essential Hypertension Chronic and well controlled.  She endorses medication compliance taking lisinopril 10 mg daily. Denies chest pain, shortness of breath, palpitations, lightheadedness, dizziness, headaches or BLE edema.  BP Readings from Last 3 Encounters:  11/17/18 119/76  09/07/18 (!) 144/80  09/06/18 (!) 176/88   Joint/Muscle Pain: Patient complains of arthralgias for which has been present for several years. Pain is located in right hand, is described as aching, numbing, sharp, shooting and tingling, and is intermittent .  Associated symptoms  include: decreased range of motion.  The patient has tried NSAIDs for pain, with complete relief.  Related to injury:   No. She has a history of right hand carpel tunnel surgery in 2014.   Review of Systems  Constitutional: Negative for fever, malaise/fatigue and weight loss.  HENT: Negative.  Negative for nosebleeds.   Eyes: Negative.  Negative for blurred vision, double vision and photophobia.  Respiratory: Negative.  Negative for cough and shortness of breath.   Cardiovascular: Negative.  Negative for chest pain, palpitations and leg swelling.  Gastrointestinal: Negative.  Negative for heartburn, nausea and vomiting.  Musculoskeletal: Positive for joint pain. Negative for myalgias.  Neurological: Negative.  Negative for dizziness, focal weakness, seizures and headaches.  Psychiatric/Behavioral: Negative.  Negative for suicidal ideas.    Past Medical History:  Diagnosis Date  . Depression   . GERD (gastroesophageal reflux disease)   . Hypertension   . Prediabetes     Past Surgical History:  Procedure Laterality Date  . ABDOMINAL HYSTERECTOMY    . BACK SURGERY    . CESAREAN SECTION    . HAND SURGERY    . NECK SURGERY  2002   plate in her neck    Family History  Problem Relation Age of Onset  . Diabetes Father     Social History Reviewed with no changes to be made today.   Outpatient Medications Prior to Visit  Medication Sig Dispense Refill  . lisinopril (PRINIVIL,ZESTRIL) 10 MG tablet Take 1 tablet (10 mg total) by mouth daily. 90 tablet 3  . omeprazole (PRILOSEC) 20 MG  capsule Take 1 capsule (20 mg total) by mouth daily. 90 capsule 3  . venlafaxine XR (EFFEXOR-XR) 150 MG 24 hr capsule Take 1 capsule (150 mg total) by mouth every morning. 90 capsule 1  . Misc. Devices MISC Please provide patient with insurance approved supplies for her CPAP: tubing, chin strap and supplies and filters. 1 each 0  . amitriptyline (ELAVIL) 75 MG tablet Take 1 tablet (75 mg total) by mouth  at bedtime. 30 tablet 3  . acetaminophen (TYLENOL) 500 MG tablet Take 1 tablet (500 mg total) every 6 (six) hours as needed by mouth. 30 tablet 0  . gabapentin (NEURONTIN) 400 MG capsule Take 1 capsule (400 mg total) by mouth 3 (three) times daily. 90 capsule 0  . ibuprofen (ADVIL,MOTRIN) 800 MG tablet Take 1 tablet (800 mg total) by mouth daily as needed (hand pain). 60 tablet 1  . naproxen (NAPROSYN) 500 MG tablet Take 1 tablet (500 mg total) by mouth 2 (two) times daily. 20 tablet 0  . oxyCODONE-acetaminophen (PERCOCET) 5-325 MG tablet Take 1-2 tablets by mouth every 6 (six) hours as needed. 20 tablet 0  . predniSONE (DELTASONE) 20 MG tablet 3 Tabs PO Days 1-3, then 2 tabs PO Days 4-6, then 1 tab PO Day 7-9, then Half Tab PO Day 10-12 20 tablet 0   No facility-administered medications prior to visit.     No Known Allergies     Objective:    BP 119/76 (BP Location: Left Arm, Patient Position: Sitting, Cuff Size: Normal)   Pulse 91   Temp 98.9 F (37.2 C) (Oral)   Resp 18   Ht 5\' 2"  (1.575 m)   Wt 157 lb (71.2 kg)   SpO2 98%   BMI 28.72 kg/m  Wt Readings from Last 3 Encounters:  11/17/18 157 lb (71.2 kg)  09/07/18 157 lb 13.6 oz (71.6 kg)  09/06/18 157 lb 15.7 oz (71.7 kg)    Physical Exam Vitals signs and nursing note reviewed.  Constitutional:      Appearance: She is well-developed.  HENT:     Head: Normocephalic and atraumatic.  Neck:     Musculoskeletal: Normal range of motion.  Cardiovascular:     Rate and Rhythm: Normal rate and regular rhythm.     Heart sounds: Normal heart sounds. No murmur. No friction rub. No gallop.   Pulmonary:     Effort: Pulmonary effort is normal. No tachypnea or respiratory distress.     Breath sounds: Normal breath sounds. No decreased breath sounds, wheezing, rhonchi or rales.  Chest:     Chest wall: No tenderness.  Abdominal:     General: Bowel sounds are normal.     Palpations: Abdomen is soft.  Musculoskeletal: Normal range of  motion.  Skin:    General: Skin is warm and dry.  Neurological:     Mental Status: She is alert and oriented to person, place, and time.     Coordination: Coordination normal.  Psychiatric:        Behavior: Behavior normal. Behavior is cooperative.        Thought Content: Thought content normal.        Judgment: Judgment normal.          Patient has been counseled extensively about nutrition and exercise as well as the importance of adherence with medications and regular follow-up. The patient was given clear instructions to go to ER or return to medical center if symptoms don't improve, worsen or new problems develop.  The patient verbalized understanding.   Follow-up: Return if symptoms worsen or fail to improve.   Gildardo Pounds, FNP-BC [facility] and [facility] [place], [place]   11/17/2018, 6:02 PM

## 2018-11-18 ENCOUNTER — Ambulatory Visit: Payer: BLUE CROSS/BLUE SHIELD | Admitting: Nurse Practitioner

## 2019-01-08 ENCOUNTER — Ambulatory Visit: Payer: Medicaid Other | Admitting: Nurse Practitioner

## 2019-01-14 ENCOUNTER — Telehealth: Payer: Self-pay | Admitting: Nurse Practitioner

## 2019-01-14 NOTE — Telephone Encounter (Signed)
Will route to PCP 

## 2019-01-14 NOTE — Telephone Encounter (Signed)
Patient called wanting to schedule an appointment. Patient states she has lower back pain and states she has frequent urination and discharge. Please follow up.

## 2019-01-20 NOTE — Telephone Encounter (Signed)
Have her make a lab apt for urine sample. Thank you

## 2019-01-21 NOTE — Telephone Encounter (Signed)
Patient scheduled for lab appt on 01/22/2019 for urine sample.

## 2019-01-22 ENCOUNTER — Other Ambulatory Visit: Payer: Self-pay

## 2019-01-22 ENCOUNTER — Telehealth: Payer: Self-pay | Admitting: Nurse Practitioner

## 2019-01-22 ENCOUNTER — Ambulatory Visit: Payer: Self-pay | Attending: Nurse Practitioner

## 2019-01-22 DIAGNOSIS — R399 Unspecified symptoms and signs involving the genitourinary system: Secondary | ICD-10-CM

## 2019-01-22 LAB — POCT URINALYSIS DIP (CLINITEK)
Bilirubin, UA: NEGATIVE
Blood, UA: NEGATIVE
Glucose, UA: NEGATIVE mg/dL
Ketones, POC UA: NEGATIVE mg/dL
Leukocytes, UA: NEGATIVE
Nitrite, UA: NEGATIVE
POC PROTEIN,UA: NEGATIVE
Spec Grav, UA: 1.015 (ref 1.010–1.025)
Urobilinogen, UA: 0.2 E.U./dL
pH, UA: 5.5 (ref 5.0–8.0)

## 2019-01-22 MED ORDER — PHENAZOPYRIDINE HCL 95 MG PO TABS: 190.0000 mg | ORAL_TABLET | Freq: Three times a day (TID) | ORAL | 0 refills | Status: AC

## 2019-01-22 NOTE — Telephone Encounter (Signed)
1) Medication(s) Requested (by name): Pyridium doesn't come in a 95mg  strength. 2) Pharmacy of Choice: [facility], [place]. 3) Special Requests:   Approved medications will be sent to the pharmacy, we will reach out if there is an issue.  Requests made after 3pm may not be addressed until the following business day!  If a patient is unsure of the name of the medication(s) please note and ask patient to call back when they are able to provide all info, do not send to responsible party until all information is available!

## 2019-01-25 ENCOUNTER — Other Ambulatory Visit: Payer: Self-pay | Admitting: Nurse Practitioner

## 2019-01-25 LAB — URINE CYTOLOGY ANCILLARY ONLY
Chlamydia: NEGATIVE
Neisseria Gonorrhea: NEGATIVE
Trichomonas: NEGATIVE

## 2019-01-25 MED ORDER — PHENAZOPYRIDINE HCL 100 MG PO TABS: 100.0000 mg | ORAL_TABLET | Freq: Three times a day (TID) | ORAL | 0 refills | Status: DC | PRN

## 2019-01-25 NOTE — Telephone Encounter (Signed)
Will route to PCP 

## 2019-01-27 LAB — URINE CYTOLOGY ANCILLARY ONLY
Bacterial vaginitis: NEGATIVE
Candida vaginitis: NEGATIVE

## 2019-02-09 ENCOUNTER — Telehealth: Payer: Self-pay | Admitting: Nurse Practitioner

## 2019-02-09 NOTE — Telephone Encounter (Signed)
Will route to PCP 

## 2019-02-09 NOTE — Telephone Encounter (Signed)
Pt called in stating she has mucus drainage from nose and coughing up white mucus thinks she might have sinus infection and she states she needs tubing for her cpap

## 2019-02-10 ENCOUNTER — Other Ambulatory Visit: Payer: Self-pay | Admitting: Nurse Practitioner

## 2019-02-10 MED ORDER — FLUTICASONE PROPIONATE 50 MCG/ACT NA SUSP: 2.0000 | Freq: Every day | NASAL | 0 refills | Status: DC

## 2019-02-10 NOTE — Telephone Encounter (Signed)
Nasal spray has been sent. Please have her follow up with [facility] regarding tubing. Not sure if she used our program for CPAP or not

## 2019-02-12 NOTE — Telephone Encounter (Signed)
CMA attempt to reach patient to inform to reach out to [facility] regarding replacing the tubing for her CPAP and PCP sent Rx for nasal spray.  No answer and left a VM for patient.

## 2019-02-13 ENCOUNTER — Other Ambulatory Visit: Payer: Self-pay | Admitting: Nurse Practitioner

## 2019-02-13 DIAGNOSIS — F339 Major depressive disorder, recurrent, unspecified: Secondary | ICD-10-CM

## 2019-02-16 ENCOUNTER — Telehealth: Payer: Self-pay | Admitting: Nurse Practitioner

## 2019-02-16 NOTE — Telephone Encounter (Signed)
New Message  Pt states she is still having a tingle in her throat with a cough and she is concerned if she should be tested for Covid. Please call

## 2019-02-17 ENCOUNTER — Other Ambulatory Visit: Payer: Self-pay | Admitting: Nurse Practitioner

## 2019-02-17 DIAGNOSIS — Z1231 Encounter for screening mammogram for malignant neoplasm of breast: Secondary | ICD-10-CM

## 2019-02-17 NOTE — Telephone Encounter (Signed)
Patient return the phone call and was inform to call the sleep center study regarding why they stated she do not need a CPAP.   Patient understood.

## 2019-02-17 NOTE — Telephone Encounter (Signed)
CMA attempt to reach patient to address her concerns.  No answer and left a VM.

## 2019-02-17 NOTE — Telephone Encounter (Signed)
Patient is request if PCP can give her for coughing.  Pt. Stated she gets itchy feeling in her throat that triggers her to cough.

## 2019-02-18 ENCOUNTER — Telehealth: Payer: Medicaid Other | Admitting: Physician Assistant

## 2019-02-18 DIAGNOSIS — B9689 Other specified bacterial agents as the cause of diseases classified elsewhere: Secondary | ICD-10-CM

## 2019-02-18 DIAGNOSIS — J208 Acute bronchitis due to other specified organisms: Principal | ICD-10-CM

## 2019-02-18 MED ORDER — DOXYCYCLINE HYCLATE 100 MG PO CAPS: 100.0000 mg | ORAL_CAPSULE | Freq: Two times a day (BID) | ORAL | 0 refills | Status: DC

## 2019-02-18 MED ORDER — BENZONATATE 100 MG PO CAPS: 100.0000 mg | ORAL_CAPSULE | Freq: Three times a day (TID) | ORAL | 0 refills | Status: DC | PRN

## 2019-02-18 NOTE — Progress Notes (Signed)
We are sorry that you are not feeling well.  Here is how we plan to help!  Based on your presentation I believe you most likely have A cough due to bacteria.  When patients have a fever and a productive cough with a change in color or increased sputum production, we are concerned about bacterial bronchitis.  If left untreated it can progress to pneumonia.  If your symptoms do not improve with your treatment plan it is important that you contact your provider.   I have prescribed Doxycycline 100 mg twice a day for 7 days     In addition you may use A prescription cough medication called Tessalon Perles 100mg . You may take 1-2 capsules every 8 hours as needed for your cough. This has also been sent in to your pharmacy.   From your responses in the eVisit questionnaire you describe inflammation in the upper respiratory tract which is causing a significant cough.  This is commonly called Bronchitis and has four common causes:    Allergies  Viral Infections  Acid Reflux  Bacterial Infection Allergies, viruses and acid reflux are treated by controlling symptoms or eliminating the cause. An example might be a cough caused by taking certain blood pressure medications. You stop the cough by changing the medication. Another example might be a cough caused by acid reflux. Controlling the reflux helps control the cough.  USE OF BRONCHODILATOR ("RESCUE") INHALERS: There is a risk from using your bronchodilator too frequently.  The risk is that over-reliance on a medication which only relaxes the muscles surrounding the breathing tubes can reduce the effectiveness of medications prescribed to reduce swelling and congestion of the tubes themselves.  Although you feel brief relief from the bronchodilator inhaler, your asthma may actually be worsening with the tubes becoming more swollen and filled with mucus.  This can delay other crucial treatments, such as oral steroid medications. If you need to use a  bronchodilator inhaler daily, several times per day, you should discuss this with your provider.  There are probably better treatments that could be used to keep your asthma under control.     HOME CARE . Only take medications as instructed by your medical team. . Complete the entire course of an antibiotic. . Drink plenty of fluids and get plenty of rest. . Avoid close contacts especially the very young and the elderly . Cover your mouth if you cough or cough into your sleeve. . Always remember to wash your hands . A steam or ultrasonic humidifier can help congestion.   GET HELP RIGHT AWAY IF: . You develop worsening fever. . You become short of breath . You cough up blood. . Your symptoms persist after you have completed your treatment plan MAKE SURE YOU   Understand these instructions.  Will watch your condition.  Will get help right away if you are not doing well or get worse.  Your e-visit answers were reviewed by a board certified advanced clinical practitioner to complete your personal care plan.  Depending on the condition, your plan could have included both over the counter or prescription medications. If there is a problem please reply  once you have received a response from your provider. Your safety is important to [place].  If you have drug allergies check your prescription carefully.    You can use MyChart to ask questions about today's visit, request a non-urgent call back, or ask for a work or school excuse for 24 hours related to this e-Visit.  If it has been greater than 24 hours you will need to follow up with your provider, or enter a new e-Visit to address those concerns. You will get an e-mail in the next two days asking about your experience.  I hope that your e-visit has been valuable and will speed your recovery. Thank you for using e-visits.   

## 2019-02-18 NOTE — Progress Notes (Signed)
I have spent 5 minutes in review of e-visit questionnaire, review and updating patient chart, medical decision making and response to patient.   Morna Flud Cody Jarae Panas, PA-C    

## 2019-02-19 ENCOUNTER — Ambulatory Visit (INDEPENDENT_AMBULATORY_CARE_PROVIDER_SITE_OTHER): Payer: 59

## 2019-02-19 ENCOUNTER — Other Ambulatory Visit: Payer: Self-pay

## 2019-02-19 ENCOUNTER — Other Ambulatory Visit: Payer: Self-pay | Admitting: Nurse Practitioner

## 2019-02-19 ENCOUNTER — Ambulatory Visit (HOSPITAL_COMMUNITY)
Admission: EM | Admit: 2019-02-19 | Discharge: 2019-02-19 | Disposition: A | Discharge status: Home / Self Care | Payer: 59 | Attending: Family Medicine | Admitting: Family Medicine

## 2019-02-19 ENCOUNTER — Encounter (HOSPITAL_COMMUNITY): Payer: Self-pay

## 2019-02-19 DIAGNOSIS — F17210 Nicotine dependence, cigarettes, uncomplicated: Secondary | ICD-10-CM

## 2019-02-19 DIAGNOSIS — R0789 Other chest pain: Secondary | ICD-10-CM

## 2019-02-19 DIAGNOSIS — R05 Cough: Secondary | ICD-10-CM

## 2019-02-19 DIAGNOSIS — K219 Gastro-esophageal reflux disease without esophagitis: Secondary | ICD-10-CM | POA: Diagnosis not present

## 2019-02-19 DIAGNOSIS — R5383 Other fatigue: Secondary | ICD-10-CM

## 2019-02-19 DIAGNOSIS — R1013 Epigastric pain: Secondary | ICD-10-CM | POA: Diagnosis not present

## 2019-02-19 DIAGNOSIS — Z1211 Encounter for screening for malignant neoplasm of colon: Secondary | ICD-10-CM

## 2019-02-19 DIAGNOSIS — R110 Nausea: Secondary | ICD-10-CM

## 2019-02-19 DIAGNOSIS — R42 Dizziness and giddiness: Secondary | ICD-10-CM

## 2019-02-19 MED ORDER — LIDOCAINE VISCOUS HCL 2 % MT SOLN
OROMUCOSAL | Status: AC
  Filled 2019-02-19: qty 15

## 2019-02-19 MED ORDER — LIDOCAINE VISCOUS HCL 2 % MT SOLN
15.0000 mL | Freq: Once | OROMUCOSAL | Status: AC
  Administered 2019-02-19: 15 mL via ORAL

## 2019-02-19 MED ORDER — ALUMINUM & MAGNESIUM HYDROXIDE 200-200 MG/5ML PO SUSP: 10.0000 mL | Freq: Four times a day (QID) | ORAL | 0 refills | Status: DC | PRN

## 2019-02-19 MED ORDER — ALUM & MAG HYDROXIDE-SIMETH 200-200-20 MG/5ML PO SUSP
ORAL | Status: AC
  Filled 2019-02-19: qty 30

## 2019-02-19 MED ORDER — OMEPRAZOLE 20 MG PO CPDR: 20.0000 mg | DELAYED_RELEASE_CAPSULE | Freq: Every day | ORAL | 0 refills | Status: DC

## 2019-02-19 MED ORDER — ALBUTEROL SULFATE HFA 108 (90 BASE) MCG/ACT IN AERS: 1.0000 | INHALATION_SPRAY | Freq: Four times a day (QID) | RESPIRATORY_TRACT | 0 refills | Status: DC | PRN

## 2019-02-19 MED ORDER — ALUM & MAG HYDROXIDE-SIMETH 200-200-20 MG/5ML PO SUSP
30.0000 mL | Freq: Once | ORAL | Status: AC
  Administered 2019-02-19: 30 mL via ORAL

## 2019-02-19 MED ORDER — CETIRIZINE HCL 10 MG PO CAPS: 10.0000 mg | ORAL_CAPSULE | Freq: Every day | ORAL | 0 refills | Status: DC

## 2019-02-19 NOTE — ED Triage Notes (Signed)
Pt presents today with nausea, dizziness, fatigue, and chest tightness not associated with any particuliar event or illness.

## 2019-02-19 NOTE — ED Provider Notes (Signed)
[place]    CSN: [id number] Arrival date & time: 02/19/19  1042     History   Chief Complaint Chief Complaint  Patient presents with  . Nausea  . Dizziness  . Chest Tightness    HPI Lauren [name] is a 53 y.o. female history of GERD, hypertension, hyperlipidemia, OSA, tobacco use presenting today for evaluation of chest tightness.  Patient states that over the past 24 hours she has developed a tightness in her chest.  When she woke up this morning she had associated nausea and fatigue.  She is also noticed occasional sensations of dizziness which is described as lightheadedness.  She notes that she has had a cough for the past 2 months.  Had ED visit last night and initiated treatment for bronchitis with doxycycline and Tessalon, she has not started these medicines yet.  She has been using Vicks vapor rub but no other over-the-counter medicines.  She denies syncope.  Denies changes in vision.  Denies changes in eating or drinking.  She is also had abdominal pain over the past 1 to 2 days as well.  Denies vomiting.  Denies diarrhea or change in bowel movements.  Currently on omeprazole daily.  Denies fevers, but has had some hot and cold chills.  Denies leg pain or leg swelling.  HPI  Past Medical History:  Diagnosis Date  . Depression   . GERD (gastroesophageal reflux disease)   . Hypertension   . Prediabetes     Patient Active Problem List   Diagnosis Date Noted  . Radiculopathy of cervical spine 08/25/2018  . Pain in right hand 07/28/2018  . Trigger finger, right ring finger 07/28/2018  . Mixed hyperlipidemia 06/19/2017  . Gastroesophageal reflux disease 04/15/2017  . OSA (obstructive sleep apnea) 04/15/2017  . Depression 11/28/2016    Past Surgical History:  Procedure Laterality Date  . ABDOMINAL HYSTERECTOMY    . BACK SURGERY    . CESAREAN SECTION    . HAND SURGERY    . NECK SURGERY  2002   plate in her neck    OB History    Gravida  1   Para    Term      Preterm      AB      Living  1     SAB      TAB      Ectopic      Multiple      Live Births  1            Home Medications    Prior to Admission medications   Medication Sig Start Date End Date Taking? Authorizing Provider  albuterol (VENTOLIN HFA) 108 (90 Base) MCG/ACT inhaler Inhale 1-2 puffs into the lungs every 6 (six) hours as needed for wheezing or shortness of breath. 02/19/19   Wieters, Hallie C, PA-C  aluminum-magnesium hydroxide 200-200 MG/5ML suspension Take 10 mLs by mouth every 6 (six) hours as needed for indigestion. 02/19/19   Wieters, Hallie C, PA-C  amitriptyline (ELAVIL) 75 MG tablet Take 1 tablet (75 mg total) by mouth at bedtime. 10/06/18 11/05/18  Gildardo Pounds, NP  benzonatate (TESSALON) 100 MG capsule Take 1 capsule (100 mg total) by mouth 3 (three) times daily as needed for cough. 02/18/19   Brunetta Jeans, PA-C  Cetirizine HCl 10 MG CAPS Take 1 capsule (10 mg total) by mouth daily for 10 days. 02/19/19 03/01/19  Wieters, Hallie C, PA-C  doxycycline (VIBRAMYCIN) 100 MG capsule Take  1 capsule (100 mg total) by mouth 2 (two) times daily. 02/18/19   Brunetta Jeans, PA-C  fluticasone (FLONASE) 50 MCG/ACT nasal spray Place 2 sprays into both nostrils daily for 14 days. 02/10/19 02/24/19  Gildardo Pounds, NP  ibuprofen (ADVIL,MOTRIN) 800 MG tablet Take 1 tablet (800 mg total) by mouth daily as needed (hand pain). 11/17/18   Gildardo Pounds, NP  lisinopril (PRINIVIL,ZESTRIL) 10 MG tablet Take 1 tablet (10 mg total) by mouth daily. 08/18/18   Gildardo Pounds, NP  Misc. Devices MISC Please provide patient with insurance approved supplies for her CPAP: tubing, chin strap and supplies and filters. 11/17/18   Gildardo Pounds, NP  omeprazole (PRILOSEC) 20 MG capsule Take 1 capsule (20 mg total) by mouth daily. 02/19/19   Wieters, Hallie C, PA-C  venlafaxine XR (EFFEXOR-XR) 150 MG 24 hr capsule Take 1 capsule by mouth in the morning 02/15/19   Gildardo Pounds, NP    Family History Family History  Problem Relation Age of Onset  . Diabetes Father     Social History Social History   Tobacco Use  . Smoking status: Current Every Day Smoker    Packs/day: 0.50    Types: Cigarettes  . Smokeless tobacco: Never Used  Substance Use Topics  . Alcohol use: No  . Drug use: No     Allergies   Patient has no known allergies.   Review of Systems Review of Systems  Constitutional: Positive for chills and fatigue. Negative for activity change, appetite change and fever.  HENT: Negative for congestion, ear pain, rhinorrhea, sinus pressure, sore throat and trouble swallowing.   Eyes: Negative for discharge and redness.  Respiratory: Positive for cough. Negative for chest tightness, shortness of breath and wheezing.   Cardiovascular: Positive for chest pain.  Gastrointestinal: Negative for abdominal pain, diarrhea, nausea and vomiting.  Musculoskeletal: Negative for myalgias.  Skin: Negative for rash.  Neurological: Positive for light-headedness. Negative for dizziness and headaches.     Physical Exam Triage Vital Signs ED Triage Vitals  Enc Vitals Group     BP 02/19/19 1109 (!) 142/86     Pulse Rate 02/19/19 1109 (!) 108     Resp 02/19/19 1109 18     Temp 02/19/19 1109 98.6 F (37 C)     Temp Source 02/19/19 1109 Oral     SpO2 02/19/19 1109 96 %     Weight --      Height --      Head Circumference --      Peak Flow --      Pain Score 02/19/19 1111 7     Pain Loc --      Pain Edu? --      Excl. in [place]? --    No data found.  Updated Vital Signs BP (!) 142/86 (BP Location: Left Arm)   Pulse (!) 108   Temp 98.6 F (37 C) (Oral)   Resp 18   SpO2 96%  HR rechecked 110 Visual Acuity Right Eye Distance:   Left Eye Distance:   Bilateral Distance:    Right Eye Near:   Left Eye Near:    Bilateral Near:     Physical Exam Vitals signs and nursing note reviewed.  Constitutional:      General: She is not in acute distress.     Appearance: She is well-developed.  HENT:     Head: Normocephalic and atraumatic.     Ears:  Comments: Bilateral ears without tenderness to palpation of external auricle, tragus and mastoid, EAC's without erythema or swelling, TM's with good bony landmarks and cone of light. Non erythematous.     Nose:     Comments: nasal mucosa erythematous, slightly swollen turbinates    Mouth/Throat:     Comments: Oral mucosa pink and moist, no tonsillar enlargement or exudate. Posterior pharynx patent and nonerythematous, no uvula deviation or swelling. Normal phonation. Eyes:     Extraocular Movements: Extraocular movements intact.     Conjunctiva/sclera: Conjunctivae normal.     Pupils: Pupils are equal, round, and reactive to light.  Neck:     Musculoskeletal: Neck supple.  Cardiovascular:     Rate and Rhythm: Regular rhythm. Tachycardia present.     Heart sounds: No murmur.  Pulmonary:     Effort: Pulmonary effort is normal. No respiratory distress.     Breath sounds: Normal breath sounds.     Comments: Breathing comfortably at rest, CTABL, no wheezing, rales or other adventitious sounds auscultated  Anterior chest tender to palpation throughout chest Chest:     Chest wall: Tenderness present.  Abdominal:     Palpations: Abdomen is soft.     Tenderness: There is no abdominal tenderness.  Musculoskeletal:     Comments: No lower extremity swelling or asymmetry bilaterally, no tenderness to palpation of calves  Skin:    General: Skin is warm and dry.  Neurological:     General: No focal deficit present.     Mental Status: She is alert and oriented to person, place, and time. Mental status is at baseline.     Cranial Nerves: No cranial nerve deficit.     Motor: No weakness.     Gait: Gait normal.      UC Treatments / Results  Labs (all labs ordered are listed, but only abnormal results are displayed) Labs Reviewed - No data to display  EKG None  Radiology Dg Chest 2  View  Result Date: 02/19/2019 CLINICAL DATA:  Productive cough since December of 2019 EXAM: CHEST - 2 VIEW COMPARISON:  August 01, 2018 FINDINGS: The heart size and mediastinal contours are within normal limits. Both lungs are clear. The visualized skeletal structures are stable. IMPRESSION: No active cardiopulmonary disease. Electronically Signed   By: Abelardo Diesel M.D.   On: 02/19/2019 11:59    Procedures Procedures (including critical care time)  Medications Ordered in UC Medications  alum & mag hydroxide-simeth (MAALOX/MYLANTA) 200-200-20 MG/5ML suspension 30 mL (30 mLs Oral Given 02/19/19 1206)    And  lidocaine (XYLOCAINE) 2 % viscous mouth solution 15 mL (15 mLs Oral Given 02/19/19 1206)    Initial Impression / Assessment and Plan / UC Course  I have reviewed the triage vital signs and the nursing notes.  Pertinent labs & imaging results that were available during my care of the patient were reviewed by me and considered in my medical decision making (see chart for details).    EKG normal sinus rhythm no acute signs of ischemia or infarction, does have nonspecific T wave changes that are present on previous EKG.  No new changes.  Chest x-ray negative for active cardiopulmonary disease.  We will continue with treatment provided by ED visit last night with continuing doxycycline to cover atypicals, Tessalon as needed for cough, albuterol inhaler as needed for shortness of breath, chest tightness; also recommended daily cetirizine to help with any postnasal drainage contributing to nighttime cough.  Chest tightness and nausea and  abdominal discomfort improved with GI cocktail.  Will increase omeprazole to 40 mg daily from her 20.  Maalox or other antacids as needed for symptomatic relief.  Discussed following up with PCP for further discussion and evaluation of her sleep apnea/need for new hoses and mask for CPAP.  Discussed following up with gastroenterology in future as may benefit from  EGD given her history of GERD and tobacco use.  Continue to monitor symptoms and follow-up here in emergency room if developing worsening symptoms, chest discomfort or abdominal pain.  Discussed strict return precautions. Patient verbalized understanding and is agreeable with plan.  Final Clinical Impressions(s) / UC Diagnoses   Final diagnoses:  Chest tightness     Discharge Instructions     Continue doxycycline and tessalon May use inhaler as needed  Begin daily cetirizine to help with any post nasal drainage  May increase omeprazole to 40 mg daily for 2 weeks Supplement with maalox as needed  Follow up Primary care  Follow up here or in ED if symptoms worsening, not improving     ED Prescriptions    Medication Sig Dispense Auth. Provider   albuterol (VENTOLIN HFA) 108 (90 Base) MCG/ACT inhaler Inhale 1-2 puffs into the lungs every 6 (six) hours as needed for wheezing or shortness of breath. 1 Inhaler Wieters, Hallie C, PA-C   aluminum-magnesium hydroxide 200-200 MG/5ML suspension Take 10 mLs by mouth every 6 (six) hours as needed for indigestion. 355 mL Wieters, Hallie C, PA-C   Cetirizine HCl 10 MG CAPS Take 1 capsule (10 mg total) by mouth daily for 10 days. 30 capsule Wieters, Hallie C, PA-C   omeprazole (PRILOSEC) 20 MG capsule Take 1 capsule (20 mg total) by mouth daily. 90 capsule Wieters, Hallie C, PA-C     Controlled Substance Prescriptions [place] Controlled Substance Registry consulted? Not Applicable   Janith Lima, [place] 02/19/19 1245

## 2019-02-19 NOTE — Discharge Instructions (Addendum)
Continue doxycycline and tessalon May use inhaler as needed  Begin daily cetirizine to help with any post nasal drainage  May increase omeprazole to 40 mg daily for 2 weeks Supplement with maalox as needed  Follow up Primary care  Follow up here or in ED if symptoms worsening, not improving

## 2019-03-15 ENCOUNTER — Encounter (HOSPITAL_COMMUNITY): Payer: Self-pay | Admitting: Emergency Medicine

## 2019-03-15 ENCOUNTER — Emergency Department (HOSPITAL_COMMUNITY)
Admission: EM | Admit: 2019-03-15 | Discharge: 2019-03-15 | Disposition: A | Discharge status: Home / Self Care | Payer: 59 | Attending: Emergency Medicine | Admitting: Emergency Medicine

## 2019-03-15 ENCOUNTER — Emergency Department (HOSPITAL_COMMUNITY): Payer: 59

## 2019-03-15 ENCOUNTER — Other Ambulatory Visit: Payer: Self-pay

## 2019-03-15 DIAGNOSIS — I10 Essential (primary) hypertension: Secondary | ICD-10-CM | POA: Diagnosis not present

## 2019-03-15 DIAGNOSIS — F17210 Nicotine dependence, cigarettes, uncomplicated: Secondary | ICD-10-CM | POA: Diagnosis not present

## 2019-03-15 DIAGNOSIS — M62838 Other muscle spasm: Secondary | ICD-10-CM

## 2019-03-15 DIAGNOSIS — R1012 Left upper quadrant pain: Secondary | ICD-10-CM | POA: Diagnosis not present

## 2019-03-15 DIAGNOSIS — M25512 Pain in left shoulder: Secondary | ICD-10-CM | POA: Insufficient documentation

## 2019-03-15 DIAGNOSIS — Z79899 Other long term (current) drug therapy: Secondary | ICD-10-CM | POA: Diagnosis not present

## 2019-03-15 LAB — COMPREHENSIVE METABOLIC PANEL
ALT: 17 U/L (ref 0–44)
AST: 20 U/L (ref 15–41)
Albumin: 4 g/dL (ref 3.5–5.0)
Alkaline Phosphatase: 70 U/L (ref 38–126)
Anion gap: 12 (ref 5–15)
BUN: 10 mg/dL (ref 6–20)
CO2: 23 mmol/L (ref 22–32)
Calcium: 9.5 mg/dL (ref 8.9–10.3)
Chloride: 105 mmol/L (ref 98–111)
Creatinine, Ser: 0.74 mg/dL (ref 0.44–1.00)
GFR calc Af Amer: >60 mL/min (ref >60)
GFR calc non Af Amer: >60 mL/min (ref >60)
Glucose, Bld: 114 mg/dL — ABNORMAL HIGH (ref 70–99)
Potassium: 4.4 mmol/L (ref 3.5–5.1)
Sodium: 140 mmol/L (ref 135–145)
Total Bilirubin: 0.4 mg/dL (ref 0.3–1.2)
Total Protein: 6.9 g/dL (ref 6.5–8.1)

## 2019-03-15 LAB — LIPASE, BLOOD: Lipase: 43 U/L (ref 11–51)

## 2019-03-15 LAB — URINALYSIS, ROUTINE W REFLEX MICROSCOPIC
Bilirubin Urine: NEGATIVE
Glucose, UA: NEGATIVE mg/dL
Hgb urine dipstick: NEGATIVE
Ketones, ur: NEGATIVE mg/dL
Leukocytes,Ua: NEGATIVE
Nitrite: NEGATIVE
Protein, ur: NEGATIVE mg/dL
Specific Gravity, Urine: 1.009 (ref 1.005–1.030)
pH: 6 (ref 5.0–8.0)

## 2019-03-15 LAB — CBC WITH DIFFERENTIAL/PLATELET
Abs Immature Granulocytes: 0.01 10*3/uL (ref 0.00–0.07)
Basophils Absolute: 0 10*3/uL (ref 0.0–0.1)
Basophils Relative: 0 %
Eosinophils Absolute: 0.2 10*3/uL (ref 0.0–0.5)
Eosinophils Relative: 2 %
HCT: 42 % (ref 36.0–46.0)
Hemoglobin: 13.9 g/dL (ref 12.0–15.0)
Immature Granulocytes: 0 %
Lymphocytes Relative: 57 %
Lymphs Abs: 4.5 10*3/uL — ABNORMAL HIGH (ref 0.7–4.0)
MCH: 30 pg (ref 26.0–34.0)
MCHC: 33.1 g/dL (ref 30.0–36.0)
MCV: 90.5 fL (ref 80.0–100.0)
Monocytes Absolute: 0.5 10*3/uL (ref 0.1–1.0)
Monocytes Relative: 6 %
Neutro Abs: 2.8 10*3/uL (ref 1.7–7.7)
Neutrophils Relative %: 35 %
Platelets: 361 10*3/uL (ref 150–400)
RBC: 4.64 MIL/uL (ref 3.87–5.11)
RDW: 13.2 % (ref 11.5–15.5)
WBC: 8 10*3/uL (ref 4.0–10.5)
nRBC: 0 % (ref 0.0–0.2)

## 2019-03-15 LAB — TROPONIN I: Troponin I: <0.03 ng/mL (ref <0.03)

## 2019-03-15 MED ORDER — SUCRALFATE 1 G PO TABS
1.0000 g | ORAL_TABLET | Freq: Once | ORAL | Status: AC
  Administered 2019-03-15: 14:00:00 1 g via ORAL
  Filled 2019-03-15: qty 1

## 2019-03-15 MED ORDER — ACETAMINOPHEN 325 MG PO TABS
650.0000 mg | ORAL_TABLET | Freq: Once | ORAL | Status: AC
  Administered 2019-03-15: 14:00:00 650 mg via ORAL
  Filled 2019-03-15: qty 2

## 2019-03-15 MED ORDER — ALUM & MAG HYDROXIDE-SIMETH 200-200-20 MG/5ML PO SUSP
30.0000 mL | Freq: Once | ORAL | Status: AC
  Administered 2019-03-15: 14:00:00 30 mL via ORAL
  Filled 2019-03-15: qty 30

## 2019-03-15 MED ORDER — METHOCARBAMOL 500 MG PO TABS: 500.0000 mg | ORAL_TABLET | Freq: Every evening | ORAL | 0 refills | Status: AC | PRN

## 2019-03-15 MED ORDER — PANTOPRAZOLE SODIUM 40 MG PO TBEC
40.0000 mg | DELAYED_RELEASE_TABLET | Freq: Once | ORAL | Status: AC
  Administered 2019-03-15: 14:00:00 40 mg via ORAL
  Filled 2019-03-15: qty 1

## 2019-03-15 MED ORDER — SUCRALFATE 1 G PO TABS: 1.0000 g | ORAL_TABLET | Freq: Three times a day (TID) | ORAL | 0 refills | Status: DC

## 2019-03-15 MED ORDER — FAMOTIDINE 20 MG PO TABS
20.0000 mg | ORAL_TABLET | Freq: Once | ORAL | Status: AC
  Administered 2019-03-15: 14:00:00 20 mg via ORAL
  Filled 2019-03-15: qty 1

## 2019-03-15 NOTE — Discharge Instructions (Signed)
You were given a prescription for carafate for you abdominal pain. Please take as directed.   You were also given a prescription for Robaxin to help with the muscle spasms that you are having in your left arm.  Do not drive, drink alcohol, operate machinery or go to work while taking this medication.  Please keep your appointment with your doctor later this week for reevaluation.  Return to the emergency department for any new or worsening symptoms in the meantime.

## 2019-03-15 NOTE — ED Notes (Signed)
Walked patient to the bathroom patient did well 

## 2019-03-15 NOTE — ED Provider Notes (Signed)
[facility] EMERGENCY DEPARTMENT Provider Note   CSN: [id number] Arrival date & time: 03/15/19  1148    History   Chief Complaint Chief Complaint  Patient presents with  . Arm Pain  . Abdominal Pain    HPI Lauren [name] is a 53 y.o. female.     HPI   Pt is a 53 y/o female with a h/o depression, GERD, HTN, prediabetes, OSA, hysterectomy who presents to the ED today c/o left shoulder/chest pain and abdominal pain.  Left shoulder/chest pain: Patient reports pain to the left side of the neck, left trapezius, left scapula, left upper arm, left axilla and left upper chest/beneath the left breast.  Pain rated 7-8/10.  It has been constant since she woke up this morning.  Feels a throbbing pain.  It is worse when lying flat.  It is not worse with movement or exertion.  She denies any significant pleuritic pain, shortness of breath or dyspnea on exertion.  She reports chronic orthopnea that is unchanged. No ble swelling, calf pain, hemoptysis, surgeries, admissions, h/o vte or ca.  Notes that she has had intermittent palpitations for the last few days.  Abdominal pain: Patient describes left upper quadrant abdominal pain that is aching in nature.  She states it also feels like she needs to have a bowel movement.  States pain is intermittent and last for a few minutes at a time.  She has no pain currently.  When she does she rates pain 5-6/10. No NVD, constipation, dysuria, or urgency.  She is scheduled for colonoscopy in the upcoming months.  She notes that she has severe acid reflux and that she recently started pantoprazole for this last week.  She feels like the medication is too strong and that is what is causing her palpitations.  She was also having some itching to her arms the other day which is since resolved.   Past Medical History:  Diagnosis Date  . Depression   . GERD (gastroesophageal reflux disease)   . Hypertension   . Prediabetes     Patient Active  Problem List   Diagnosis Date Noted  . Radiculopathy of cervical spine 08/25/2018  . Pain in right hand 07/28/2018  . Trigger finger, right ring finger 07/28/2018  . Mixed hyperlipidemia 06/19/2017  . Gastroesophageal reflux disease 04/15/2017  . OSA (obstructive sleep apnea) 04/15/2017  . Depression 11/28/2016    Past Surgical History:  Procedure Laterality Date  . ABDOMINAL HYSTERECTOMY    . BACK SURGERY    . CESAREAN SECTION    . HAND SURGERY    . NECK SURGERY  2002   plate in her neck     OB History    Gravida  1   Para      Term      Preterm      AB      Living  1     SAB      TAB      Ectopic      Multiple      Live Births  1            Home Medications    Prior to Admission medications   Medication Sig Start Date End Date Taking? Authorizing Provider  albuterol (VENTOLIN HFA) 108 (90 Base) MCG/ACT inhaler Inhale 1-2 puffs into the lungs every 6 (six) hours as needed for wheezing or shortness of breath. 02/19/19   Wieters, Hallie C, PA-C  aluminum-magnesium hydroxide 200-200 MG/5ML suspension  Take 10 mLs by mouth every 6 (six) hours as needed for indigestion. 02/19/19   Wieters, Hallie C, PA-C  amitriptyline (ELAVIL) 75 MG tablet Take 1 tablet (75 mg total) by mouth at bedtime. 10/06/18 11/05/18  Gildardo Pounds, NP  benzonatate (TESSALON) 100 MG capsule Take 1 capsule (100 mg total) by mouth 3 (three) times daily as needed for cough. 02/18/19   Brunetta Jeans, PA-C  Cetirizine HCl 10 MG CAPS Take 1 capsule (10 mg total) by mouth daily for 10 days. 02/19/19 03/01/19  Wieters, Hallie C, PA-C  doxycycline (VIBRAMYCIN) 100 MG capsule Take 1 capsule (100 mg total) by mouth 2 (two) times daily. 02/18/19   Brunetta Jeans, PA-C  fluticasone (FLONASE) 50 MCG/ACT nasal spray Place 2 sprays into both nostrils daily for 14 days. 02/10/19 02/24/19  Gildardo Pounds, NP  ibuprofen (ADVIL,MOTRIN) 800 MG tablet Take 1 tablet (800 mg total) by mouth daily as needed (hand  pain). 11/17/18   Gildardo Pounds, NP  lisinopril (PRINIVIL,ZESTRIL) 10 MG tablet Take 1 tablet (10 mg total) by mouth daily. 08/18/18   Gildardo Pounds, NP  methocarbamol (ROBAXIN) 500 MG tablet Take 1 tablet (500 mg total) by mouth at bedtime as needed for up to 5 days for muscle spasms. 03/15/19 03/20/19  Lyell Clugston S, PA-C  Misc. Devices MISC Please provide patient with insurance approved supplies for her CPAP: tubing, chin strap and supplies and filters. 11/17/18   Gildardo Pounds, NP  omeprazole (PRILOSEC) 20 MG capsule Take 1 capsule (20 mg total) by mouth daily. 02/19/19   Wieters, Hallie C, PA-C  sucralfate (CARAFATE) 1 g tablet Take 1 tablet (1 g total) by mouth 3 (three) times daily with meals for 14 days. 03/15/19 03/29/19  Shellee Streng S, PA-C  venlafaxine XR (EFFEXOR-XR) 150 MG 24 hr capsule Take 1 capsule by mouth in the morning 02/15/19   Gildardo Pounds, NP    Family History Family History  Problem Relation Age of Onset  . Diabetes Father     Social History Social History   Tobacco Use  . Smoking status: Current Every Day Smoker    Packs/day: 0.50    Types: Cigarettes  . Smokeless tobacco: Never Used  Substance Use Topics  . Alcohol use: No  . Drug use: No     Allergies   Patient has no known allergies.   Review of Systems Review of Systems  Constitutional: Negative for chills and fever.  HENT: Negative for congestion, ear pain, rhinorrhea and sore throat.   Eyes: Negative for visual disturbance.  Respiratory: Negative for cough and shortness of breath.   Cardiovascular: Negative for chest pain, palpitations and leg swelling.  Gastrointestinal: Positive for abdominal pain. Negative for constipation, diarrhea, nausea and vomiting.  Genitourinary: Negative for dysuria, flank pain and hematuria.  Musculoskeletal: Positive for neck pain. Negative for back pain.       Left shoulder and arm pain  Skin: Negative for color change and rash.  Neurological:  Negative for dizziness, weakness, light-headedness, numbness and headaches.  All other systems reviewed and are negative.    Physical Exam Updated Vital Signs BP 126/69   Pulse 80   Temp 98.6 F (37 C) (Oral)   Resp 15   Ht 5\' 2"  (1.575 m)   Wt 70.3 kg   SpO2 96%   BMI 28.35 kg/m   Physical Exam Vitals signs and nursing note reviewed.  Constitutional:      General: She  is not in acute distress.    Appearance: She is well-developed. She is not ill-appearing or toxic-appearing.  HENT:     Head: Normocephalic and atraumatic.  Eyes:     Conjunctiva/sclera: Conjunctivae normal.  Neck:     Musculoskeletal: Neck supple.  Cardiovascular:     Rate and Rhythm: Normal rate and regular rhythm.     Pulses: Normal pulses.     Heart sounds: Normal heart sounds. No murmur.  Pulmonary:     Effort: Pulmonary effort is normal. No respiratory distress.     Breath sounds: Normal breath sounds. No wheezing, rhonchi or rales.     Comments: No crepitus Chest:     Chest wall: Tenderness (left upper chest wall) present.  Abdominal:     General: Bowel sounds are normal. There is no distension.     Palpations: Abdomen is soft.     Tenderness: There is no abdominal tenderness. There is no right CVA tenderness, left CVA tenderness, guarding or rebound.  Musculoskeletal:     Comments: TTP to the left cervical paraspinous muscles, left trapezius muscles, alone the medial/inferior bordrers or the scapula, left axilla, and left chest which reproduces her pain. No rashes.  Skin:    General: Skin is warm and dry.  Neurological:     Mental Status: She is alert.    ED Treatments / Results  Labs (all labs ordered are listed, but only abnormal results are displayed) Labs Reviewed  CBC WITH DIFFERENTIAL/PLATELET - Abnormal; Notable for the following components:      Result Value   Lymphs Abs 4.5 (*)    All other components within normal limits  COMPREHENSIVE METABOLIC PANEL - Abnormal; Notable for  the following components:   Glucose, Bld 114 (*)    All other components within normal limits  URINALYSIS, ROUTINE W REFLEX MICROSCOPIC - Abnormal; Notable for the following components:   Color, Urine STRAW (*)    All other components within normal limits  LIPASE, BLOOD  TROPONIN I    EKG EKG Interpretation  Date/Time:  Monday Mar 15 2019 12:03:43 EDT Ventricular Rate:  89 PR Interval:    QRS Duration: 72 QT Interval:  364 QTC Calculation: 443 R Axis:   68 Text Interpretation:  Sinus rhythm Probable left atrial enlargement Borderline T wave abnormalities similar to prior 5/20 Confirmed by Aletta Edouard [phone]) on 03/15/2019 12:18:28 PM   Radiology Dg Chest 2 View  Result Date: 03/15/2019 CLINICAL DATA:  Left shoulder pain EXAM: CHEST - 2 VIEW COMPARISON:  02/19/2019 FINDINGS: The heart size and mediastinal contours are within normal limits. Both lungs are clear. The visualized skeletal structures are unremarkable. IMPRESSION: No acute abnormality of the lungs.  No focal airspace opacity. Electronically Signed   By: Eddie Candle M.D.   On: 03/15/2019 12:27    Procedures Procedures (including critical care time)  Medications Ordered in ED Medications  famotidine (PEPCID) tablet 20 mg (20 mg Oral Given 03/15/19 1349)  pantoprazole (PROTONIX) EC tablet 40 mg (40 mg Oral Given 03/15/19 1350)  acetaminophen (TYLENOL) tablet 650 mg (650 mg Oral Given 03/15/19 1349)  sucralfate (CARAFATE) tablet 1 g (1 g Oral Given 03/15/19 1350)  alum & mag hydroxide-simeth (MAALOX/MYLANTA) 200-200-20 MG/5ML suspension 30 mL (30 mLs Oral Given 03/15/19 1350)     Initial Impression / Assessment and Plan / ED Course  I have reviewed the triage vital signs and the nursing notes.  Pertinent labs & imaging results that were available during my care of  the patient were reviewed by me and considered in my medical decision making (see chart for details).     Final Clinical Impressions(s) / ED Diagnoses    Final diagnoses:  LUQ abdominal pain  Muscle spasm   Pt is a 53 y/o female with a h/o GERD presenting with left/chest pain and LUQ abd pain.    Left shoulder/chest pain: On exam, pt has TTP to the left cervical paraspinous muscles, left trapezius muscles, alone the medial/inferior bordrers or the scapula, left axilla, and left chest which reproduces her pain. Cardiac w/u is benign including negative CXR, EKG w/o ischemic changes, trop negative. HEART score 4 but this is very atypical for ACS. Low suspicion for PE or dissection. Doubt other acute cardiac/pulm etiology. Sxs consistent with MSK pain. She also has h/o cervical radiculopathy and is s/p cervical spine surgery which could be contributing. Will give rx for muscle relaxers and have her f/u closely with pcp.   Abdominal pain: Mild LUQ TTP, no rebound, rigidity, or guarding. Nonsurgical abdomen. No other associated GI/GU complaints. Labs reviewed and notable for the following: CBC is without leukocytosis, no anemia CMP with normal electrolytes, kidney and liver function Lipase is negative Trop negative UA is not suggestive of UTI.  Pt given GI cocktail, PPI, H2 blocker, carafate. On reassessment, patient feels completely improved and no longer has any abdominal pain.  Repeat abdominal exam is benign.  She has been able to tolerate p.o.  She also states that the pain in her left shoulder and arm has improved after Tylenol. Has a long h/o GERD.  Suspect symptoms related to this but would consider gastritis or PUD as well.  Has never had EGD. Does have plans to f/u with GI as outpt.  She has appoint with PCP later this week.  I advised her to keep her appointment and to follow-up with her GI doctor.  Will give Rx for Carafate and Robaxin.  Advised to return to the ER for new or worsening symptoms.  She voiced understanding the plan reasons return.  Questions answered.  Patient stable for discharge.   ED Discharge Orders         Ordered     sucralfate (CARAFATE) 1 g tablet  3 times daily with meals     03/15/19 1504    methocarbamol (ROBAXIN) 500 MG tablet  At bedtime PRN     03/15/19 1504           Curtisha Bendix S, PA-C 03/15/19 1507    Hayden Rasmussen, MD 03/16/19 1153

## 2019-03-15 NOTE — ED Notes (Signed)
Patient verbalizes understanding of discharge instructions. Opportunity for questioning and answers were provided. Armband removed by staff, pt discharged from ED ambulatory to home.  

## 2019-03-15 NOTE — ED Triage Notes (Signed)
Pt states she awoke with lower abdominal pain.  Now she is c/o L arm pain that radiates to chest. Pain increases when laying.

## 2019-03-15 NOTE — ED Notes (Signed)
Gave patient some water patient is resting with call bell in reach 

## 2019-03-15 NOTE — ED Notes (Signed)
Got patient undress on the monitor did ekg shown to er doctor patient is now gone to x-ray

## 2019-03-24 IMAGING — MG DIGITAL SCREENING BILATERAL MAMMOGRAM WITH TOMO AND CAD
8 series · 9 of 24 positions shown · non-contrast
Comparison: Previous exam(s).

CLINICAL DATA: Screening.

EXAM:
DIGITAL SCREENING BILATERAL MAMMOGRAM WITH TOMO AND CAD

[R MLO synth-2D]
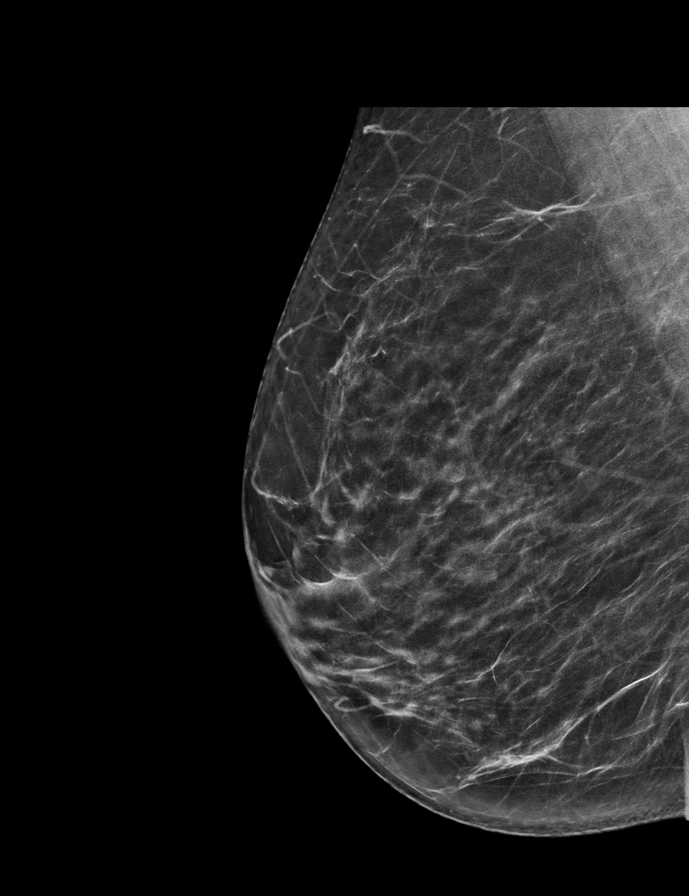

[R CC synth-2D]
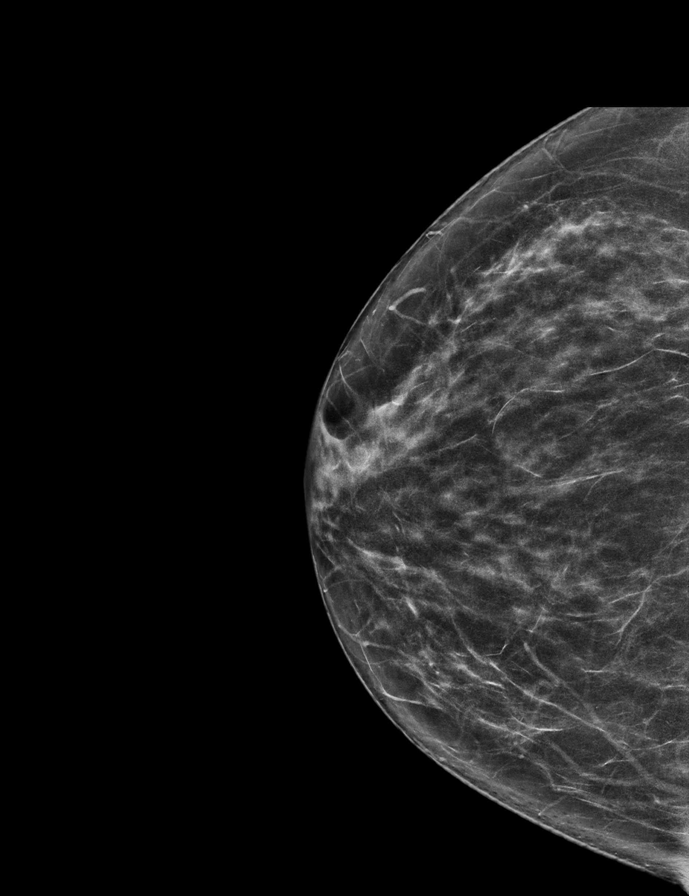

[L CC synth-2D]
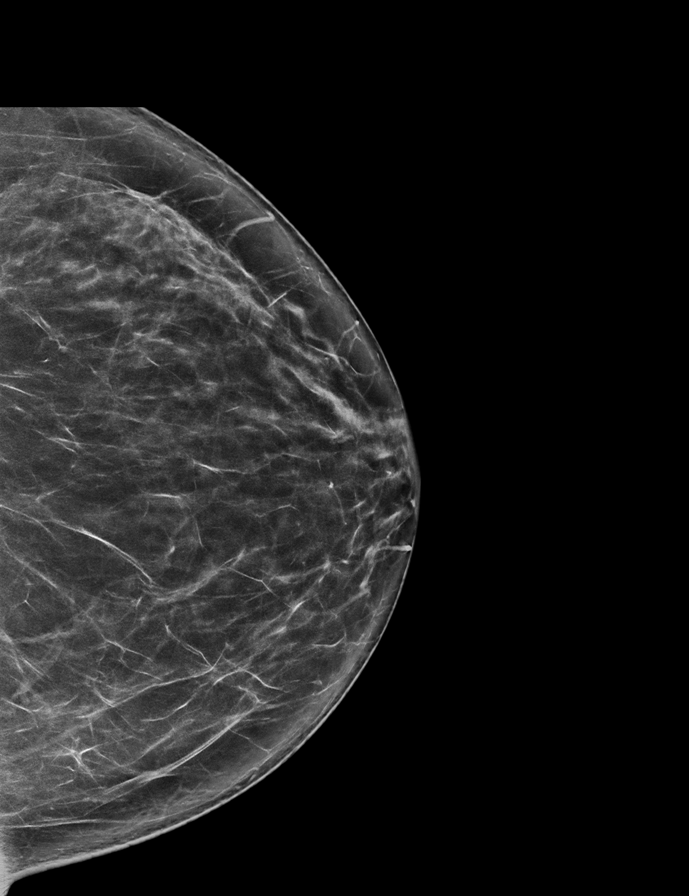

[L MLO synth-2D]
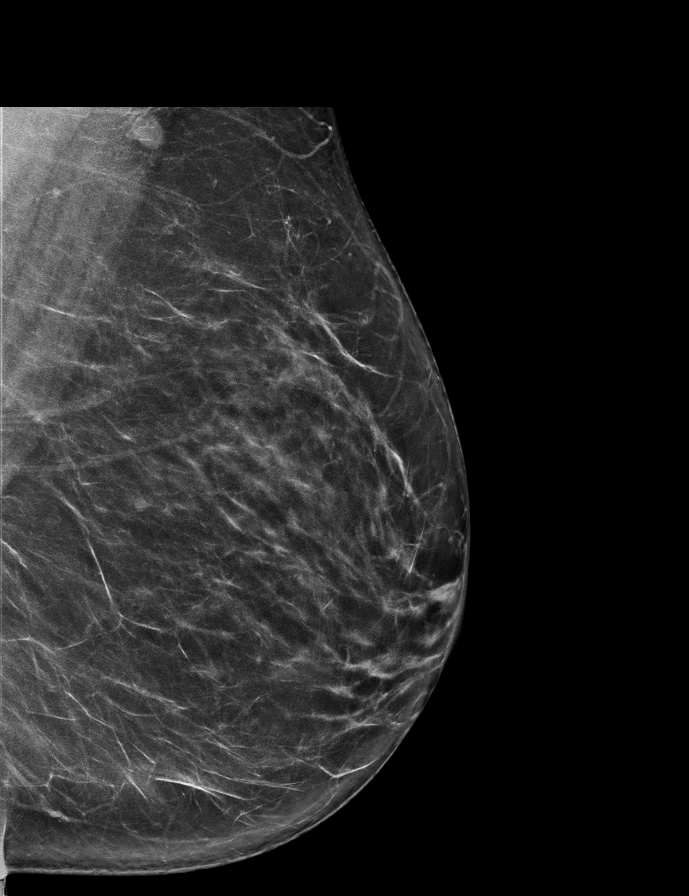

[R CC tomo · 2 of 67 frames shown]
[frame 22/67]
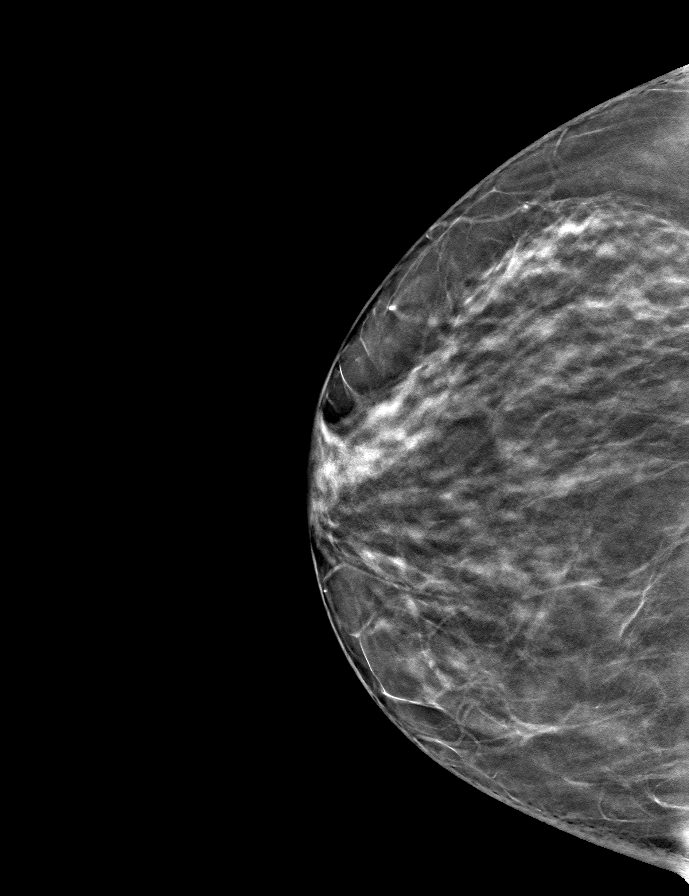
[frame 34/67]
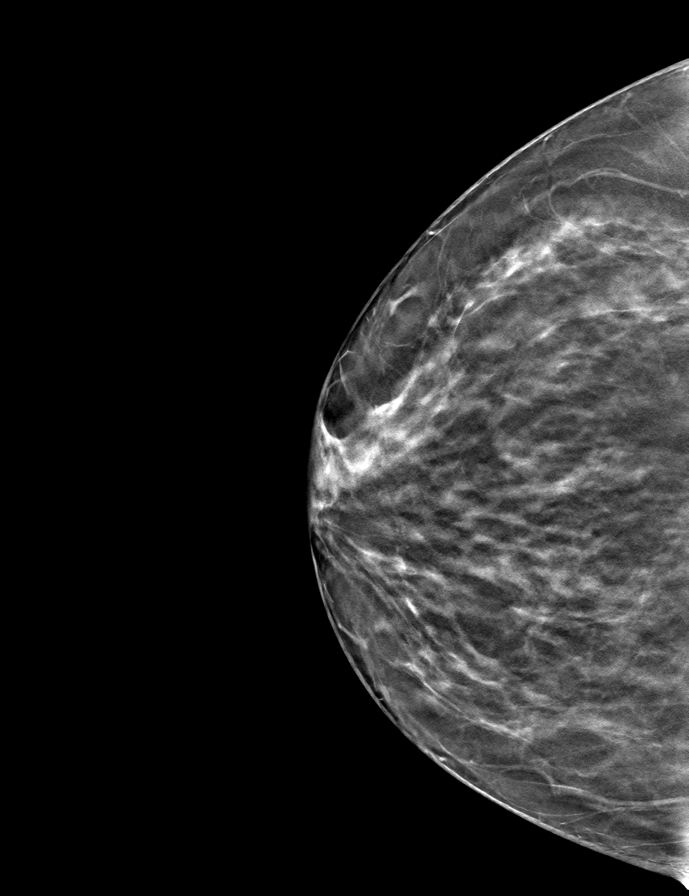

[R MLO tomo · tomo slice 36/71.0]
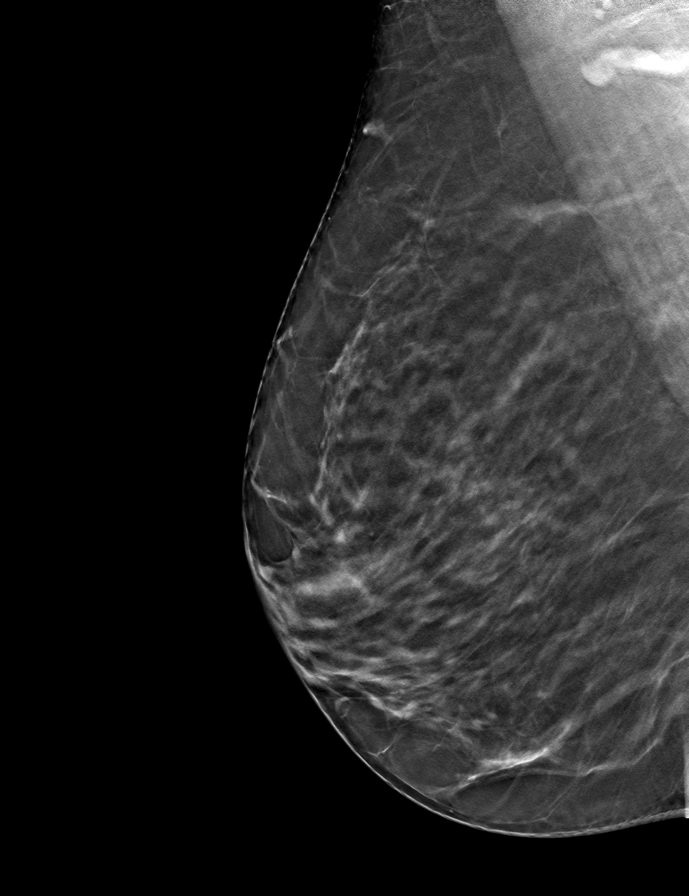

[L MLO tomo · tomo slice 39/78.0]
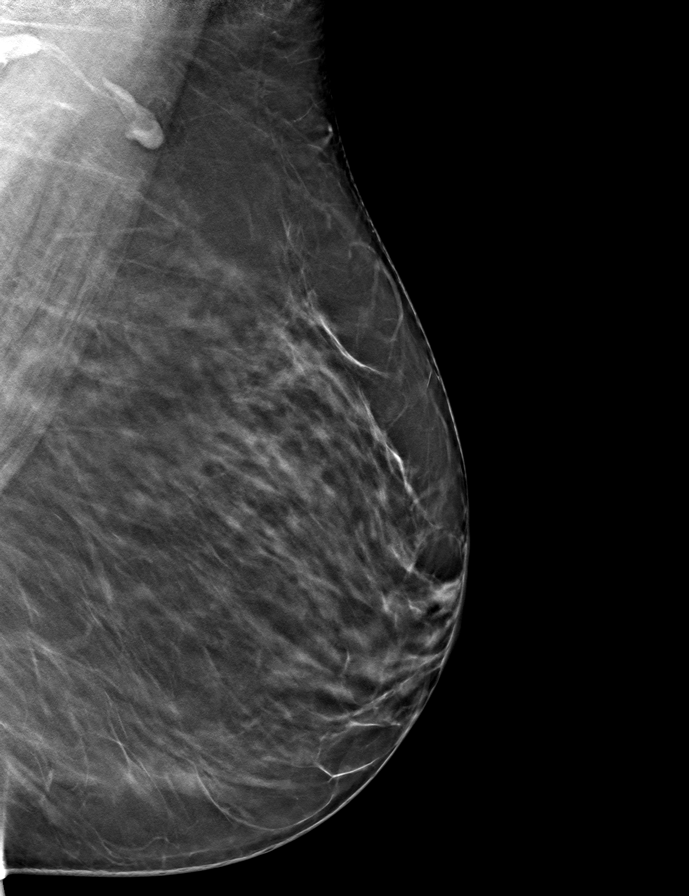

[L CC tomo · tomo slice 39/76.0]
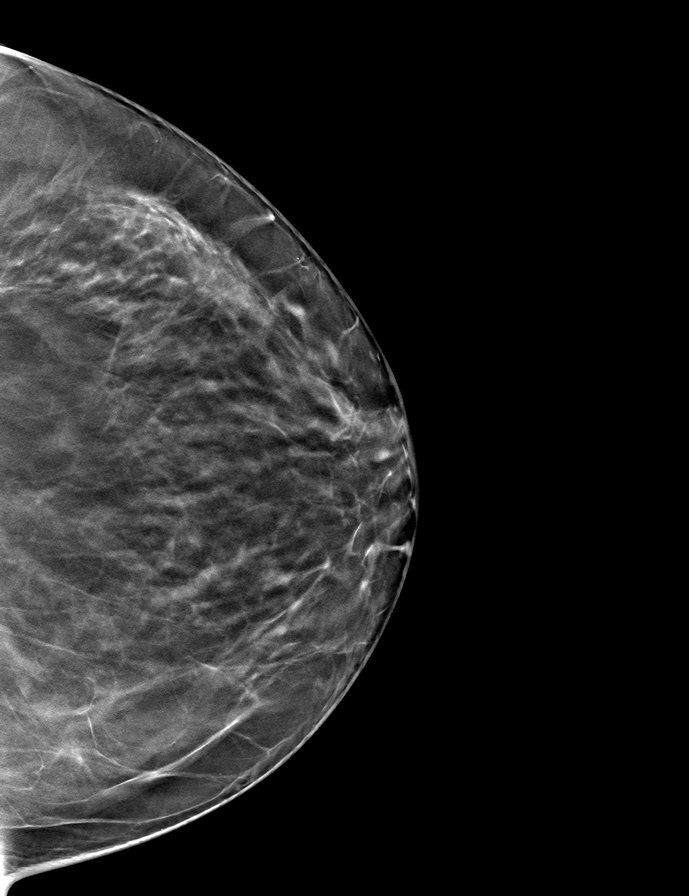

[9 of 24 positions shown; findings below may reference images not displayed]

ACR Breast Density Category b: There are scattered areas of
fibroglandular density.
FINDINGS: There are no findings suspicious for malignancy. Images were
processed with CAD.
IMPRESSION: No mammographic evidence of malignancy. A result letter of this
screening mammogram will be mailed directly to the patient.

RECOMMENDATION:
Screening mammogram in one year. (Code:[id number])

BI-RADS CATEGORY  1: Negative.

## 2019-03-31 ENCOUNTER — Encounter: Payer: Self-pay | Admitting: Gastroenterology

## 2019-04-14 ENCOUNTER — Ambulatory Visit
Admission: RE | Admit: 2019-04-14 | Discharge: 2019-04-14 | Disposition: A | Discharge status: Home / Self Care | Payer: Medicaid Other | Source: Ambulatory Visit | Attending: Nurse Practitioner | Admitting: Nurse Practitioner

## 2019-04-14 ENCOUNTER — Other Ambulatory Visit: Payer: Self-pay

## 2019-04-14 DIAGNOSIS — Z1231 Encounter for screening mammogram for malignant neoplasm of breast: Secondary | ICD-10-CM

## 2019-04-20 ENCOUNTER — Other Ambulatory Visit: Payer: Self-pay

## 2019-04-20 ENCOUNTER — Ambulatory Visit: Payer: 59 | Admitting: *Deleted

## 2019-04-20 VITALS — Ht 62.0 in | Wt 160.0 lb

## 2019-04-20 DIAGNOSIS — Z1211 Encounter for screening for malignant neoplasm of colon: Secondary | ICD-10-CM

## 2019-04-20 NOTE — Progress Notes (Signed)
No egg or soy allergy known to patient  No issues with past sedation with any surgeries  or procedures, no intubation problems  No diet pills per patient No home 02 use per patient  No blood thinners per patient  Pt denies issues with constipation  No A fib or A flutter  EMMI video sent to pt's e mail   Pt verified name, DOB, address and insurance during PV today. Pt mailed instruction packet to included paper to complete and mail back to [facility] with addressed and stamped envelope, Emmi video, copy of consent form to read and not return, and instructions. Pt has a Golytely prep at home already  PV completed over the phone. Pt encouraged to call with questions or issues   Pt is aware that care partner will wait in the car during parking lot; if they feel like they will be too hot to wait in the car; they may wait in the lobby.  We want them to wear a mask (we do not have any that we can provide them), practice social distancing, and we will check their temperatures when they get here.  I did remind patient that their care partner needs to stay in the parking lot the entire time. Pt will wear mask into building

## 2019-04-28 ENCOUNTER — Encounter: Payer: Self-pay | Admitting: Gastroenterology

## 2019-05-03 ENCOUNTER — Telehealth: Payer: Self-pay | Admitting: Gastroenterology

## 2019-05-03 NOTE — Telephone Encounter (Signed)

## 2019-05-04 ENCOUNTER — Other Ambulatory Visit: Payer: Self-pay

## 2019-05-04 ENCOUNTER — Ambulatory Visit (AMBULATORY_SURGERY_CENTER): Payer: 59 | Admitting: Gastroenterology

## 2019-05-04 ENCOUNTER — Encounter: Payer: Self-pay | Admitting: Gastroenterology

## 2019-05-04 VITALS — BP 140/45 | HR 77 | Temp 98.9°F | Resp 13

## 2019-05-04 DIAGNOSIS — D125 Benign neoplasm of sigmoid colon: Secondary | ICD-10-CM

## 2019-05-04 DIAGNOSIS — D123 Benign neoplasm of transverse colon: Secondary | ICD-10-CM

## 2019-05-04 DIAGNOSIS — Z1211 Encounter for screening for malignant neoplasm of colon: Secondary | ICD-10-CM | POA: Diagnosis not present

## 2019-05-04 DIAGNOSIS — K635 Polyp of colon: Secondary | ICD-10-CM

## 2019-05-04 DIAGNOSIS — D122 Benign neoplasm of ascending colon: Secondary | ICD-10-CM | POA: Diagnosis not present

## 2019-05-04 MED ORDER — SODIUM CHLORIDE 0.9 % IV SOLN: 500.0000 mL | Freq: Once | INTRAVENOUS | Status: DC

## 2019-05-04 NOTE — Patient Instructions (Signed)
Thank you for allowing [place] to care for you today!  Await pathology results by mail, approximately 2 weeks.   Will make recommendation for next colonoscopy date at that time.  Resume previous diet and medications today.  Return to normal activities tomorrow.     YOU HAD AN ENDOSCOPIC PROCEDURE TODAY AT [facility]:   Refer to the procedure report that was given to you for any specific questions about what was found during the examination.  If the procedure report does not answer your questions, please call your gastroenterologist to clarify.  If you requested that your care partner not be given the details of your procedure findings, then the procedure report has been included in a sealed envelope for you to review at your convenience later.  YOU SHOULD EXPECT: Some feelings of bloating in the abdomen. Passage of more gas than usual.  Walking can help get rid of the air that was put into your GI tract during the procedure and reduce the bloating. If you had a lower endoscopy (such as a colonoscopy or flexible sigmoidoscopy) you may notice spotting of blood in your stool or on the toilet paper. If you underwent a bowel prep for your procedure, you may not have a normal bowel movement for a few days.  Please Note:  You might notice some irritation and congestion in your nose or some drainage.  This is from the oxygen used during your procedure.  There is no need for concern and it should clear up in a day or so.  SYMPTOMS TO REPORT IMMEDIATELY:   Following lower endoscopy (colonoscopy or flexible sigmoidoscopy):  Excessive amounts of blood in the stool  Significant tenderness or worsening of abdominal pains  Swelling of the abdomen that is new, acute  Fever of 100F or higher    For urgent or emergent issues, a gastroenterologist can be reached at any hour by calling [phone].   DIET:  We do recommend a small meal at first, but then you may proceed to your regular  diet.  Drink plenty of fluids but you should avoid alcoholic beverages for 24 hours.  ACTIVITY:  You should plan to take it easy for the rest of today and you should NOT DRIVE or use heavy machinery until tomorrow (because of the sedation medicines used during the test).    FOLLOW UP: Our staff will call the number listed on your records 48-72 hours following your procedure to check on you and address any questions or concerns that you may have regarding the information given to you following your procedure. If we do not reach you, we will leave a message.  We will attempt to reach you two times.  During this call, we will ask if you have developed any symptoms of COVID 19. If you develop any symptoms (ie: fever, flu-like symptoms, shortness of breath, cough etc.) before then, please call [phone].  If you test positive for Covid 19 in the 2 weeks post procedure, please call and report this information to [place].    If any biopsies were taken you will be contacted by phone or by letter within the next 1-3 weeks.  Please call us at [phone] if you have not heard about the biopsies in 3 weeks.    SIGNATURES/CONFIDENTIALITY: You and/or your care partner have signed paperwork which will be entered into your electronic medical record.  These signatures attest to the fact that that the information above on your After Visit Summary  has been reviewed and is understood.  Full responsibility of the confidentiality of this discharge information lies with you and/or your care-partner. 

## 2019-05-04 NOTE — Progress Notes (Signed)
Pt's states no medical or surgical changes since previsit or office visit. 

## 2019-05-04 NOTE — Op Note (Addendum)
Lauren [name]: Lauren [name] Procedure Date: 05/04/2019 8:11 AM MRN: [id number] Endoscopist: Thornton Park MD, MD Age: 53 Referring MD:  Date of Birth: 03-21-1966 Gender: Female Account #: [id number] Procedure:                Colonoscopy Indications:              Screening for colorectal malignant neoplasm, This                            is the patient's first colonoscopy                           No known family history of colon cancer or polyps.                            No baseline GI symptoms. Medicines:                See the Anesthesia note for documentation of the                            administered medications Procedure:                Pre-Anesthesia Assessment:                           - Prior to the procedure, a History and Physical                            was performed, and patient medications and                            allergies were reviewed. The patient's tolerance of                            previous anesthesia was also reviewed. The risks                            and benefits of the procedure and the sedation                            options and risks were discussed with the patient.                            All questions were answered, and informed consent                            was obtained. Prior Anticoagulants: The patient has                            taken no previous anticoagulant or antiplatelet                            agents. ASA Grade Assessment: II - A patient with  mild systemic disease. After reviewing the risks                            and benefits, the patient was deemed in                            satisfactory condition to undergo the procedure.                           After obtaining informed consent, the colonoscope                            was passed under direct vision. Throughout the                            procedure, the patient's blood pressure, pulse, and                             oxygen saturations were monitored continuously. The                            Colonoscope was introduced through the anus and                            advanced to the the terminal ileum, with                            identification of the appendiceal orifice and IC                            valve. A second forward view of the right colon was                            performed. The colonoscopy was performed without                            difficulty. The patient tolerated the procedure                            well. The quality of the bowel preparation was                            good. The terminal ileum, ileocecal valve,                            appendiceal orifice, and rectum were photographed. Scope In: 8:21:33 AM Scope Out: 8:36:28 AM Scope Withdrawal Time: 0 hours 12 minutes 16 seconds  Total Procedure Duration: 0 hours 14 minutes 55 seconds  Findings:                 The perianal and digital rectal examinations were                            normal.  A 1 mm polyp was found in the distal ascending                            colon. The polyp was sessile. The polyp was removed                            with a cold biopsy forceps. Resection and retrieval                            were complete. Estimated blood loss was minimal.                           A 3 mm polyp was found in the splenic flexure. The                            polyp was flat. The polyp was removed with a cold                            snare. Resection and retrieval were complete.                            Estimated blood loss was minimal.                           A 2 mm polyp was found in the sigmoid colon. The                            polyp was sessile. The polyp was removed with a                            cold snare. Resection and retrieval were complete.                            Estimated blood loss was minimal. Estimated blood                             loss was minimal.                           The exam was otherwise without abnormality on                            direct and retroflexion views. Complications:            No immediate complications. Estimated blood loss:                            Minimal. Estimated Blood Loss:     Estimated blood loss was minimal. Impression:               - One 1 mm polyp in the distal ascending colon,  removed with a cold biopsy forceps. Resected and                            retrieved.                           - One 3 mm polyp at the splenic flexure, removed                            with a cold snare. Resected and retrieved.                           - One 2 mm polyp in the sigmoid colon, removed with                            a cold snare. Resected and retrieved.                           - The examination was otherwise normal on direct                            and retroflexion views. Recommendation:           - Patient has a contact number available for                            emergencies. The signs and symptoms of potential                            delayed complications were discussed with the                            patient. Return to normal activities tomorrow.                            Written discharge instructions were provided to the                            patient.                           - Resume regular diet today.                           - Continue present medications.                           - Await pathology results.                           - Repeat colonoscopy date to be determined after                            pending pathology results are reviewed for  surveillance. If all three polyps are adenomas,                            will repeat colonoscopy in 3 years. If one or two                            are adenomas, will repeat colonoscopy in 7 years.                             If all of the polyps are benign, will plan repeat                            colonoscopy in 10 years, or earlier with the                            development of any new symptoms. Thornton Park MD, MD 05/04/2019 8:42:34 AM This report has been signed electronically.

## 2019-05-04 NOTE — Progress Notes (Signed)
Called to room to assist during endoscopic procedure.  Patient ID and intended procedure confirmed with present staff. Received instructions for my participation in the procedure from the performing physician.  

## 2019-05-04 NOTE — Progress Notes (Signed)
PT taken to PACU. Monitors in place. VSS. Report given to RN. 

## 2019-05-06 ENCOUNTER — Telehealth: Payer: Self-pay

## 2019-05-06 NOTE — Telephone Encounter (Signed)
  Follow up Call-  Call back number 05/04/2019  Post procedure Call Back phone  # [phone]  Permission to leave phone message Yes  Some recent data might be hidden     Patient questions:  Do you have a fever, pain , or abdominal swelling? No. Pain Score  0 *  Have you tolerated food without any problems? Yes.    Have you been able to return to your normal activities? Yes.    Do you have any questions about your discharge instructions: Diet   No. Medications  No. Follow up visit  No.  Do you have questions or concerns about your Care? No.  Actions: * If pain score is 4 or above: No action needed, pain <4. 1. Have you developed a fever since your procedure? no  2.   Have you had an respiratory symptoms (SOB or cough) since your procedure? no  3.   Have you tested positive for COVID 19 since your procedure no  4.   Have you had any family members/close contacts diagnosed with the COVID 19 since your procedure?  no   If yes to any of these questions please route to Joylene John, RN and Alphonsa Gin, Therapist, sports.

## 2019-05-09 ENCOUNTER — Encounter: Payer: Self-pay | Admitting: Gastroenterology

## 2019-07-11 ENCOUNTER — Emergency Department (HOSPITAL_COMMUNITY)
Admission: EM | Admit: 2019-07-11 | Discharge: 2019-07-11 | Disposition: A | Discharge status: Home / Self Care | Payer: 59 | Attending: Emergency Medicine | Admitting: Emergency Medicine

## 2019-07-11 ENCOUNTER — Other Ambulatory Visit: Payer: Self-pay

## 2019-07-11 DIAGNOSIS — I10 Essential (primary) hypertension: Secondary | ICD-10-CM | POA: Insufficient documentation

## 2019-07-11 DIAGNOSIS — Z79899 Other long term (current) drug therapy: Secondary | ICD-10-CM | POA: Diagnosis not present

## 2019-07-11 DIAGNOSIS — L239 Allergic contact dermatitis, unspecified cause: Secondary | ICD-10-CM | POA: Insufficient documentation

## 2019-07-11 DIAGNOSIS — R21 Rash and other nonspecific skin eruption: Secondary | ICD-10-CM | POA: Diagnosis present

## 2019-07-11 DIAGNOSIS — F17210 Nicotine dependence, cigarettes, uncomplicated: Secondary | ICD-10-CM | POA: Insufficient documentation

## 2019-07-11 MED ORDER — PREDNISONE 20 MG PO TABS
60.0000 mg | ORAL_TABLET | Freq: Once | ORAL | Status: AC
  Administered 2019-07-11: 60 mg via ORAL
  Filled 2019-07-11: qty 3

## 2019-07-11 NOTE — ED Triage Notes (Signed)
Pt states she woke up this morning with a rash. Small red marks noted on extremities and back. Pt states she has been itching, no relief from benadryl. Last took medication this morning. No airway involvement.

## 2019-07-11 NOTE — Discharge Instructions (Signed)
Please read instructions below. You can take Benadryl as needed every 6 hours for itching.  You can also take 18 allergy medicine instead to prevent drowsiness. Take 20 mg of Pepcid daily as well. You can apply hydrocortisone cream to your rash to help with itching. Avoid scratching as much as possible. Follow up with your primary care provider if rash doesn't improve in the next few days. Return to the ER for fever, difficulty breathing or swallowing, or new or worsening symptoms.

## 2019-07-11 NOTE — ED Notes (Signed)
Patient verbalizes understanding of discharge instructions. Opportunity for questioning and answers were provided. Armband removed by staff, pt discharged from ED, ambulatory. 

## 2019-07-11 NOTE — ED Provider Notes (Signed)
[facility] Provider Note   CSN: [id number] Arrival date & time: 07/11/19  1743     History   Chief Complaint Chief Complaint  Patient presents with  . Rash    HPI Lauren [name] is a 53 y.o. female.  With past medical history of hypertension, GERD, presenting to the ED with complaint of itchy rash to arms and legs that began upon awakening this morning.  Patient states she was visiting her sister and slept on a couch.  She states her sister has been having issues with small spiders in the house.  She woke up in the middle the night itching with red bites on her arms and legs.  The rash has not been moving.  She took Benadryl today for symptom relief.  Denies any tick bites, recent antibiotics or new medications.  No swelling of her lips or tongue or abdominal pain.     The history is provided by the patient.    Past Medical History:  Diagnosis Date  . Anxiety   . Arthritis   . Depression   . GERD (gastroesophageal reflux disease)   . Hypertension   . Prediabetes   . Sleep apnea    Past hx - no cpap now     Patient Active Problem List   Diagnosis Date Noted  . Radiculopathy of cervical spine 08/25/2018  . Pain in right hand 07/28/2018  . Trigger finger, right ring finger 07/28/2018  . Mixed hyperlipidemia 06/19/2017  . Gastroesophageal reflux disease 04/15/2017  . OSA (obstructive sleep apnea) 04/15/2017  . Depression 11/28/2016    Past Surgical History:  Procedure Laterality Date  . ABDOMINAL HYSTERECTOMY    . BACK SURGERY    . CESAREAN SECTION    . HAND SURGERY    . NECK SURGERY  2002   plate in her neck     OB History    Gravida  1   Para      Term      Preterm      AB      Living  1     SAB      TAB      Ectopic      Multiple      Live Births  1            Home Medications    Prior to Admission medications   Medication Sig Start Date End Date Taking? Authorizing Provider  albuterol  (VENTOLIN HFA) 108 (90 Base) MCG/ACT inhaler Inhale 1-2 puffs into the lungs every 6 (six) hours as needed for wheezing or shortness of breath. 02/19/19   Wieters, Hallie C, PA-C  aluminum-magnesium hydroxide 200-200 MG/5ML suspension Take 10 mLs by mouth every 6 (six) hours as needed for indigestion. Patient not taking: Reported on 04/20/2019 02/19/19   Wieters, Hallie C, PA-C  amitriptyline (ELAVIL) 75 MG tablet Take 1 tablet (75 mg total) by mouth at bedtime. 10/06/18 11/05/18  Gildardo Pounds, NP  benzonatate (TESSALON) 100 MG capsule Take 1 capsule (100 mg total) by mouth 3 (three) times daily as needed for cough. 02/18/19   Brunetta Jeans, PA-C  Cetirizine HCl 10 MG CAPS Take 1 capsule (10 mg total) by mouth daily for 10 days. 02/19/19 03/01/19  Wieters, Hallie C, PA-C  doxycycline (VIBRAMYCIN) 100 MG capsule Take 1 capsule (100 mg total) by mouth 2 (two) times daily. 02/18/19   Brunetta Jeans, PA-C  fluticasone (FLONASE) 50 MCG/ACT nasal  spray Place 2 sprays into both nostrils daily for 14 days. 02/10/19 02/24/19  Gildardo Pounds, NP  ibuprofen (ADVIL,MOTRIN) 800 MG tablet Take 1 tablet (800 mg total) by mouth daily as needed (hand pain). 11/17/18   Gildardo Pounds, NP  lisinopril (PRINIVIL,ZESTRIL) 10 MG tablet Take 1 tablet (10 mg total) by mouth daily. 08/18/18   Gildardo Pounds, NP  Misc. Devices MISC Please provide patient with insurance approved supplies for her CPAP: tubing, chin strap and supplies and filters. 11/17/18   Gildardo Pounds, NP  Multiple Vitamin (MULTIVITAMIN) capsule Take by mouth.    [provider]  omeprazole (PRILOSEC) 20 MG capsule Take 1 capsule (20 mg total) by mouth daily. 02/19/19   Wieters, Hallie C, PA-C  sucralfate (CARAFATE) 1 g tablet Take 1 tablet (1 g total) by mouth 3 (three) times daily with meals for 14 days. 03/15/19 03/29/19  Couture, Cortni S, PA-C  venlafaxine XR (EFFEXOR-XR) 150 MG 24 hr capsule Take 1 capsule by mouth in the morning 02/15/19    Gildardo Pounds, NP    Family History Family History  Problem Relation Age of Onset  . Diabetes Father   . Colon cancer Neg Hx   . Colon polyps Neg Hx   . Esophageal cancer Neg Hx   . Rectal cancer Neg Hx   . Stomach cancer Neg Hx     Social History Social History   Tobacco Use  . Smoking status: Current Every Day Smoker    Packs/day: 0.50    Types: Cigarettes  . Smokeless tobacco: Never Used  . Tobacco comment: 0.5 or less   Substance Use Topics  . Alcohol use: No  . Drug use: No     Allergies   Patient has no known allergies.   Review of Systems Review of Systems  Constitutional: Negative for fever.  Skin: Positive for rash.     Physical Exam Updated Vital Signs BP (!) 158/90   Pulse (!) 104   Temp 98.8 F (37.1 C) (Oral)   Resp 14   SpO2 98%   Physical Exam Vitals signs and nursing note reviewed.  Constitutional:      General: She is not in acute distress.    Appearance: She is well-developed.  HENT:     Head: Normocephalic and atraumatic.     Mouth/Throat:     Mouth: Mucous membranes are moist.     Pharynx: Oropharynx is clear.  Eyes:     Conjunctiva/sclera: Conjunctivae normal.  Cardiovascular:     Rate and Rhythm: Normal rate and regular rhythm.  Pulmonary:     Effort: Pulmonary effort is normal. No respiratory distress.  Musculoskeletal:     Comments: Multiple dime to quarter sized raised erythematous papules to the arms and left leg.  Some of them are random, and some of them appear to be in a linear distribution.  No vesicles, petechia, pustules, desquamation.  No involvement of the palms or soles.  No oral involvement.  Neurological:     Mental Status: She is alert.  Psychiatric:        Mood and Affect: Mood normal.        Behavior: Behavior normal.      ED Treatments / Results  Labs (all labs ordered are listed, but only abnormal results are displayed) Labs Reviewed - No data to display  EKG None  Radiology No results  found.  Procedures Procedures (including critical care time)  Medications Ordered in ED Medications  predniSONE (DELTASONE) tablet 60 mg (60 mg Oral Given 07/11/19 1929)     Initial Impression / Assessment and Plan / ED Course  I have reviewed the triage vital signs and the nursing notes.  Pertinent labs & imaging results that were available during my care of the patient were reviewed by me and considered in my medical decision making (see chart for details).        Patient with rash consistent with allergic dermatitis, likely from insect bite.  No signs or symptoms of anaphylaxis.  Recommend symptomatic management including antihistamines, topical hydrocortisone cream.  Patient agreeable to plan and safe for discharge.  Discussed results, findings, treatment and follow up. Patient advised of return precautions. Patient verbalized understanding and agreed with plan.  Final Clinical Impressions(s) / ED Diagnoses   Final diagnoses:  Allergic dermatitis    ED Discharge Orders    None       , [place] N, PA-C 07/11/19 2012    Elnora Morrison, MD 07/12/19 [phone]

## 2019-09-08 ENCOUNTER — Other Ambulatory Visit: Payer: Self-pay

## 2019-09-08 ENCOUNTER — Emergency Department (HOSPITAL_COMMUNITY)
Admission: EM | Admit: 2019-09-08 | Discharge: 2019-09-08 | Disposition: A | Discharge status: Home / Self Care | Payer: 59 | Attending: Emergency Medicine | Admitting: Emergency Medicine

## 2019-09-08 ENCOUNTER — Encounter (HOSPITAL_COMMUNITY): Payer: Self-pay | Admitting: Emergency Medicine

## 2019-09-08 ENCOUNTER — Emergency Department (HOSPITAL_COMMUNITY): Payer: 59

## 2019-09-08 DIAGNOSIS — Z79899 Other long term (current) drug therapy: Secondary | ICD-10-CM | POA: Diagnosis not present

## 2019-09-08 DIAGNOSIS — M546 Pain in thoracic spine: Secondary | ICD-10-CM | POA: Diagnosis not present

## 2019-09-08 DIAGNOSIS — R0602 Shortness of breath: Secondary | ICD-10-CM | POA: Diagnosis not present

## 2019-09-08 DIAGNOSIS — M25512 Pain in left shoulder: Secondary | ICD-10-CM | POA: Diagnosis not present

## 2019-09-08 DIAGNOSIS — F17210 Nicotine dependence, cigarettes, uncomplicated: Secondary | ICD-10-CM | POA: Insufficient documentation

## 2019-09-08 DIAGNOSIS — I10 Essential (primary) hypertension: Secondary | ICD-10-CM | POA: Insufficient documentation

## 2019-09-08 DIAGNOSIS — R0789 Other chest pain: Secondary | ICD-10-CM | POA: Diagnosis not present

## 2019-09-08 LAB — CBC
HCT: 43.5 % (ref 36.0–46.0)
Hemoglobin: 13.9 g/dL (ref 12.0–15.0)
MCH: 29.9 pg (ref 26.0–34.0)
MCHC: 32 g/dL (ref 30.0–36.0)
MCV: 93.5 fL (ref 80.0–100.0)
Platelets: 358 10*3/uL (ref 150–400)
RBC: 4.65 MIL/uL (ref 3.87–5.11)
RDW: 13.6 % (ref 11.5–15.5)
WBC: 8.3 10*3/uL (ref 4.0–10.5)
nRBC: 0 % (ref 0.0–0.2)

## 2019-09-08 LAB — I-STAT BETA HCG BLOOD, ED (MC, WL, AP ONLY): I-stat hCG, quantitative: <5 m[IU]/mL (ref <5)

## 2019-09-08 LAB — TROPONIN I (HIGH SENSITIVITY)
Troponin I (High Sensitivity): <2 ng/L (ref <18)
Troponin I (High Sensitivity): <2 ng/L (ref <18)

## 2019-09-08 LAB — BASIC METABOLIC PANEL
Anion gap: 12 (ref 5–15)
BUN: 10 mg/dL (ref 6–20)
CO2: 20 mmol/L — ABNORMAL LOW (ref 22–32)
Calcium: 9.2 mg/dL (ref 8.9–10.3)
Chloride: 108 mmol/L (ref 98–111)
Creatinine, Ser: 0.62 mg/dL (ref 0.44–1.00)
GFR calc Af Amer: >60 mL/min (ref >60)
GFR calc non Af Amer: >60 mL/min (ref >60)
Glucose, Bld: 137 mg/dL — ABNORMAL HIGH (ref 70–99)
Potassium: 4 mmol/L (ref 3.5–5.1)
Sodium: 140 mmol/L (ref 135–145)

## 2019-09-08 MED ORDER — SODIUM CHLORIDE 0.9% FLUSH: 3.0000 mL | Freq: Once | INTRAVENOUS | Status: DC

## 2019-09-08 MED ORDER — IBUPROFEN 400 MG PO TABS
600.0000 mg | ORAL_TABLET | Freq: Once | ORAL | Status: AC
  Administered 2019-09-08: 600 mg via ORAL
  Filled 2019-09-08: qty 1

## 2019-09-08 NOTE — Discharge Instructions (Signed)
Take Tylenol/Motrin as discussed.

## 2019-09-08 NOTE — ED Triage Notes (Signed)
C/o sharp left sided chest pain that woke her up at 6am.  Reports SOB and feeling lightheaded.  Pain radiates to L arm.

## 2019-09-08 NOTE — ED Provider Notes (Signed)
[facility] EMERGENCY DEPARTMENT Provider Note   CSN: [id number] Arrival date & time: 09/08/19  [id number]     History   Chief Complaint Chief Complaint  Patient presents with  . Chest Pain  . Shortness of Breath    HPI Merridee Dempsey is a 53 y.o. female.     The history is provided by the patient.  Chest Pain Pain location:  L chest (left shoulder/upper back) Pain quality: aching   Pain severity:  Mild Onset quality:  Gradual Timing:  Intermittent Progression:  Waxing and waning Context: lifting, movement and raising an arm   Relieved by:  Nothing Worsened by:  Movement Associated symptoms: back pain   Associated symptoms: no abdominal pain, no cough, no fever, no nausea, no palpitations, no shortness of breath and no vomiting   Risk factors: hypertension   Risk factors: no coronary artery disease, no diabetes mellitus, no high cholesterol and no prior DVT/PE     Past Medical History:  Diagnosis Date  . Anxiety   . Arthritis   . Depression   . GERD (gastroesophageal reflux disease)   . Hypertension   . Prediabetes   . Sleep apnea    Past hx - no cpap now     Patient Active Problem List   Diagnosis Date Noted  . Radiculopathy of cervical spine 08/25/2018  . Pain in right hand 07/28/2018  . Trigger finger, right ring finger 07/28/2018  . Mixed hyperlipidemia 06/19/2017  . Gastroesophageal reflux disease 04/15/2017  . OSA (obstructive sleep apnea) 04/15/2017  . Depression 11/28/2016    Past Surgical History:  Procedure Laterality Date  . ABDOMINAL HYSTERECTOMY    . BACK SURGERY    . CESAREAN SECTION    . HAND SURGERY    . NECK SURGERY  2002   plate in her neck     OB History    Gravida  1   Para      Term      Preterm      AB      Living  1     SAB      TAB      Ectopic      Multiple      Live Births  1            Home Medications    Prior to Admission medications   Medication Sig Start Date End Date  Taking? Authorizing Provider  albuterol (VENTOLIN HFA) 108 (90 Base) MCG/ACT inhaler Inhale 1-2 puffs into the lungs every 6 (six) hours as needed for wheezing or shortness of breath. 02/19/19   Wieters, Hallie C, PA-C  aluminum-magnesium hydroxide 200-200 MG/5ML suspension Take 10 mLs by mouth every 6 (six) hours as needed for indigestion. Patient not taking: Reported on 04/20/2019 02/19/19   Wieters, Hallie C, PA-C  amitriptyline (ELAVIL) 75 MG tablet Take 1 tablet (75 mg total) by mouth at bedtime. 10/06/18 11/05/18  Gildardo Pounds, NP  benzonatate (TESSALON) 100 MG capsule Take 1 capsule (100 mg total) by mouth 3 (three) times daily as needed for cough. 02/18/19   Brunetta Jeans, PA-C  Cetirizine HCl 10 MG CAPS Take 1 capsule (10 mg total) by mouth daily for 10 days. 02/19/19 03/01/19  Wieters, Hallie C, PA-C  doxycycline (VIBRAMYCIN) 100 MG capsule Take 1 capsule (100 mg total) by mouth 2 (two) times daily. 02/18/19   Brunetta Jeans, PA-C  fluticasone (FLONASE) 50 MCG/ACT nasal spray Place 2 sprays  into both nostrils daily for 14 days. 02/10/19 02/24/19  Gildardo Pounds, NP  ibuprofen (ADVIL,MOTRIN) 800 MG tablet Take 1 tablet (800 mg total) by mouth daily as needed (hand pain). 11/17/18   Gildardo Pounds, NP  lisinopril (PRINIVIL,ZESTRIL) 10 MG tablet Take 1 tablet (10 mg total) by mouth daily. 08/18/18   Gildardo Pounds, NP  Misc. Devices MISC Please provide patient with insurance approved supplies for her CPAP: tubing, chin strap and supplies and filters. 11/17/18   Gildardo Pounds, NP  Multiple Vitamin (MULTIVITAMIN) capsule Take by mouth.    [provider]  omeprazole (PRILOSEC) 20 MG capsule Take 1 capsule (20 mg total) by mouth daily. 02/19/19   Wieters, Hallie C, PA-C  sucralfate (CARAFATE) 1 g tablet Take 1 tablet (1 g total) by mouth 3 (three) times daily with meals for 14 days. 03/15/19 03/29/19  Couture, Cortni S, PA-C  venlafaxine XR (EFFEXOR-XR) 150 MG 24 hr capsule Take 1 capsule  by mouth in the morning 02/15/19   Gildardo Pounds, NP    Family History Family History  Problem Relation Age of Onset  . Diabetes Father   . Colon cancer Neg Hx   . Colon polyps Neg Hx   . Esophageal cancer Neg Hx   . Rectal cancer Neg Hx   . Stomach cancer Neg Hx     Social History Social History   Tobacco Use  . Smoking status: Current Every Day Smoker    Packs/day: 0.50    Types: Cigarettes  . Smokeless tobacco: Never Used  . Tobacco comment: 0.5 or less   Substance Use Topics  . Alcohol use: No  . Drug use: No     Allergies   Patient has no known allergies.   Review of Systems Review of Systems  Constitutional: Negative for chills and fever.  HENT: Negative for ear pain and sore throat.   Eyes: Negative for pain and visual disturbance.  Respiratory: Negative for cough and shortness of breath.   Cardiovascular: Positive for chest pain. Negative for palpitations.  Gastrointestinal: Negative for abdominal pain, nausea and vomiting.  Genitourinary: Negative for dysuria and hematuria.  Musculoskeletal: Positive for arthralgias and back pain.  Skin: Negative for color change and rash.  Neurological: Negative for seizures and syncope.  All other systems reviewed and are negative.    Physical Exam Updated Vital Signs  ED Triage Vitals  Enc Vitals Group     BP 09/08/19 0827 (!) 141/78     Pulse Rate 09/08/19 0827 87     Resp 09/08/19 0827 18     Temp 09/08/19 0827 98.7 F (37.1 C)     Temp Source 09/08/19 0827 Oral     SpO2 09/08/19 0827 100 %     Weight --      Height --      Head Circumference --      Peak Flow --      Pain Score 09/08/19 0733 10     Pain Loc --      Pain Edu? --      Excl. in [place]? --     Physical Exam Vitals signs and nursing note reviewed.  Constitutional:      General: She is not in acute distress.    Appearance: She is well-developed.  HENT:     Head: Normocephalic and atraumatic.  Eyes:     Extraocular Movements:  Extraocular movements intact.     Conjunctiva/sclera: Conjunctivae normal.  Pupils: Pupils are equal, round, and reactive to light.  Neck:     Musculoskeletal: Neck supple.  Cardiovascular:     Rate and Rhythm: Normal rate and regular rhythm.     Pulses:          Radial pulses are 2+ on the right side and 2+ on the left side.     Heart sounds: Normal heart sounds. No murmur.  Pulmonary:     Effort: Pulmonary effort is normal. No respiratory distress.     Breath sounds: Normal breath sounds. No decreased breath sounds, wheezing, rhonchi or rales.  Chest:     Chest wall: Mass present.  Abdominal:     Palpations: Abdomen is soft.     Tenderness: There is no abdominal tenderness.  Musculoskeletal: Normal range of motion.     Comments: Tenderness over left side of chest wall, left scapular area that is worse with range of motion at the shoulder  Skin:    General: Skin is warm and dry.  Neurological:     General: No focal deficit present.     Mental Status: She is alert.     Motor: No weakness.      ED Treatments / Results  Labs (all labs ordered are listed, but only abnormal results are displayed) Labs Reviewed  BASIC METABOLIC PANEL - Abnormal; Notable for the following components:      Result Value   CO2 20 (*)    Glucose, Bld 137 (*)    All other components within normal limits  CBC  I-STAT BETA HCG BLOOD, ED ([facility], [facility], [facility] ONLY)  TROPONIN I (HIGH SENSITIVITY)  TROPONIN I (HIGH SENSITIVITY)    EKG EKG Interpretation  Date/Time:  Wednesday September 08 2019 07:39:20 EST Ventricular Rate:  103 PR Interval:  136 QRS Duration: 80 QT Interval:  346 QTC Calculation: 453 R Axis:   69 Text Interpretation: Sinus tachycardia Otherwise normal ECG No STEMI Confirmed by Octaviano Glow [id number]) on 09/08/2019 9:09:17 AM   Radiology Dg Chest 2 View  Result Date: 09/08/2019 CLINICAL DATA:  Chest pain EXAM: CHEST - 2 VIEW COMPARISON:  Mar 15, 2019 FINDINGS: Lungs are clear.  Heart size and pulmonary vascularity are normal. No adenopathy. No pneumothorax. There is postoperative change in the lower cervical region. IMPRESSION: No edema or consolidation.  Cardiac silhouette within normal limits. Electronically Signed   By: Lowella Grip III M.D.   On: 09/08/2019 08:17    Procedures Procedures (including critical care time)  Medications Ordered in ED Medications  sodium chloride flush (NS) 0.9 % injection 3 mL (has no administration in time range)  ibuprofen (ADVIL) tablet 600 mg (600 mg Oral Given 09/08/19 1059)     Initial Impression / Assessment and Plan / ED Course  I have reviewed the triage vital signs and the nursing notes.  Pertinent labs & imaging results that were available during my care of the patient were reviewed by me and considered in my medical decision making (see chart for details).     Chyna Hoeg is a 53 year old female with history of hypertension, asthma who presents to the ED with left-sided chest wall pain, left scapular pain.  Patient with unremarkable vitals.  No fever.  Pain on and off for the last several hours.  Worse with movement.  Pain is reproducible in the left side of her chest, left scapular area.  Worse when she moves her left upper extremity.  Does work a Retail buyer job.  Denies  any specific trauma.  No PE risk factors and doubt PE.  Does not have pleuritic chest pain.  Does not have any hypoxia, shortness of breath.  EKG shows sinus rhythm.  No ischemic changes.  Troponin negative x2.  Heart score 3.  Doubt ACS.  Pain improved with Motrin.  No significant anemia, electrolyte abnormality, kidney injury.  Suspect musculoskeletal type pain.  Recommend follow-up with primary care doctor and discharged from ED in good condition.  Given return precautions.  This chart was dictated using voice recognition software.  Despite best efforts to proofread,  errors can occur which can change the documentation meaning.    Final  Clinical Impressions(s) / ED Diagnoses   Final diagnoses:  Atypical chest pain    ED Discharge Orders    None       Lennice Sites, DO 09/08/19 1138

## 2019-09-17 ENCOUNTER — Other Ambulatory Visit: Payer: Self-pay

## 2019-09-17 ENCOUNTER — Encounter (HOSPITAL_COMMUNITY): Payer: Self-pay

## 2019-09-17 ENCOUNTER — Ambulatory Visit (HOSPITAL_COMMUNITY)
Admission: EM | Admit: 2019-09-17 | Discharge: 2019-09-17 | Disposition: A | Discharge status: Home / Self Care | Payer: 59 | Attending: Family Medicine | Admitting: Family Medicine

## 2019-09-17 DIAGNOSIS — T300 Burn of unspecified body region, unspecified degree: Secondary | ICD-10-CM | POA: Diagnosis not present

## 2019-09-17 MED ORDER — SILVER SULFADIAZINE 1 % EX CREA
TOPICAL_CREAM | CUTANEOUS | Status: AC
  Filled 2019-09-17: qty 85

## 2019-09-17 MED ORDER — SILVER SULFADIAZINE 1 % EX CREA: 1.0000 "application " | TOPICAL_CREAM | Freq: Every day | CUTANEOUS | 0 refills | Status: DC

## 2019-09-17 MED ORDER — SILVER SULFADIAZINE 1 % EX CREA
TOPICAL_CREAM | Freq: Once | CUTANEOUS | Status: AC
  Administered 2019-09-17: 09:00:00 via TOPICAL
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
## 2019-09-17 NOTE — Discharge Instructions (Signed)
Apply silvadene cream daily with loose bulky dressing for protection.  I would keep the blister intact until it self drains, keep the skin intact to cover burn as a natural barrier. Likely with slough off on its own.  This likely will take a few weeks to heal.  Please return or follow up with your primary care provider for any signs of infection- redness, swelling, warmth or drainage.

## 2020-01-21 ENCOUNTER — Other Ambulatory Visit: Payer: Self-pay

## 2020-01-21 ENCOUNTER — Ambulatory Visit (HOSPITAL_COMMUNITY)
Admission: EM | Admit: 2020-01-21 | Discharge: 2020-01-21 | Disposition: A | Discharge status: Home / Self Care | Payer: 59 | Attending: Family Medicine | Admitting: Family Medicine

## 2020-01-21 ENCOUNTER — Encounter (HOSPITAL_COMMUNITY): Payer: Self-pay

## 2020-01-21 DIAGNOSIS — M62830 Muscle spasm of back: Secondary | ICD-10-CM

## 2020-01-21 DIAGNOSIS — G8929 Other chronic pain: Secondary | ICD-10-CM

## 2020-01-21 LAB — POCT URINALYSIS DIP (DEVICE)
Bilirubin Urine: NEGATIVE
Glucose, UA: NEGATIVE mg/dL
Hgb urine dipstick: NEGATIVE
Ketones, ur: NEGATIVE mg/dL
Leukocytes,Ua: NEGATIVE
Nitrite: NEGATIVE
Protein, ur: NEGATIVE mg/dL
Specific Gravity, Urine: 1.02 (ref 1.005–1.030)
Urobilinogen, UA: 0.2 mg/dL (ref 0.0–1.0)
pH: 6 (ref 5.0–8.0)

## 2020-01-21 MED ORDER — KETOROLAC TROMETHAMINE 30 MG/ML IJ SOLN
INTRAMUSCULAR | Status: AC
  Filled 2020-01-21: qty 1

## 2020-01-21 MED ORDER — KETOROLAC TROMETHAMINE 30 MG/ML IJ SOLN
30.0000 mg | Freq: Once | INTRAMUSCULAR | Status: AC
  Administered 2020-01-21: 30 mg via INTRAMUSCULAR

## 2020-01-21 MED ORDER — CYCLOBENZAPRINE HCL 10 MG PO TABS: 10.0000 mg | ORAL_TABLET | Freq: Two times a day (BID) | ORAL | 0 refills | Status: DC | PRN

## 2020-01-21 MED ORDER — MELOXICAM 15 MG PO TABS: 15.0000 mg | ORAL_TABLET | Freq: Every day | ORAL | 0 refills | Status: DC
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
## 2020-01-21 NOTE — Discharge Instructions (Addendum)
You are experiencing a flare up of sciatica.   You have gotten a shot of Toradol for pain in this office.   You may continue the steroids at home. I will also send in flexeril for you to take twice daily as needed for muscle spasms. I have also sent in an antiinflammatory for you to take once daily as well.   If you lose bowel or bladder control, or lose sensation in your leg follow up in the ER.

## 2020-06-22 ENCOUNTER — Ambulatory Visit (HOSPITAL_COMMUNITY)
Admission: EM | Admit: 2020-06-22 | Discharge: 2020-06-22 | Disposition: A | Discharge status: Home / Self Care | Payer: 59

## 2020-06-22 ENCOUNTER — Other Ambulatory Visit: Payer: Self-pay

## 2020-06-22 NOTE — ED Notes (Signed)
Pt called x 3. And called on the phone with no answer.

## 2020-07-20 ENCOUNTER — Ambulatory Visit (HOSPITAL_COMMUNITY)
Admission: EM | Admit: 2020-07-20 | Discharge: 2020-07-20 | Disposition: A | Discharge status: Home / Self Care | Payer: Medicaid Other

## 2020-07-20 ENCOUNTER — Encounter (HOSPITAL_COMMUNITY): Payer: Self-pay | Admitting: *Deleted

## 2020-07-20 ENCOUNTER — Other Ambulatory Visit: Payer: Self-pay

## 2020-07-20 DIAGNOSIS — G5702 Lesion of sciatic nerve, left lower limb: Secondary | ICD-10-CM

## 2020-07-20 MED ORDER — KETOROLAC TROMETHAMINE 30 MG/ML IJ SOLN
INTRAMUSCULAR | Status: AC
  Filled 2020-07-20: qty 1

## 2020-07-20 MED ORDER — KETOROLAC TROMETHAMINE 30 MG/ML IJ SOLN
30.0000 mg | Freq: Once | INTRAMUSCULAR | Status: AC
  Administered 2020-07-20: 30 mg via INTRAMUSCULAR

## 2020-07-20 MED ORDER — CYCLOBENZAPRINE HCL 5 MG PO TABS: 5.0000 mg | ORAL_TABLET | Freq: Three times a day (TID) | ORAL | 0 refills | Status: DC | PRN

## 2020-07-20 MED ORDER — PREDNISONE 10 MG (21) PO TBPK: ORAL_TABLET | ORAL | 0 refills | Status: DC

## 2020-07-20 NOTE — Discharge Instructions (Signed)
Prednisone daily for 6 days with food.  Flexeril as needed of muscle relaxant.  Toradol given here for pain  Follow up as needed for continued or worsening symptoms

## 2020-07-20 NOTE — ED Triage Notes (Signed)
Patient in with complaints of left leg pain that started a month ago.Patient states she has a history of sciatica and one back surgery in 2013.Patient states she has to wait for the insurance to be started since starting a new job in order to see her doctor. Patient requesting medication to help relieve pain until she is able to see a provider. Patient states that pain increases with standing and activity. Patient also requesting a refill of metoprolol.

## 2020-07-23 NOTE — ED Provider Notes (Signed)
[facility]    CSN: [id number] Arrival date & time: 07/20/20  0803      History   Chief Complaint Chief Complaint  Patient presents with  . Leg Pain    HPI Lauren [name] is a 54 y.o. female.   Patient is a 54 year old female the presents today with left leg pain that started approximate 1 month ago.  Reporting history of sciatic nerve pain and previous back surgery back in 2013.  The pain in her back and leg worsens with standing and specific activities.  Reported some associated numbness and tingling.  Does a lot of standing and walking at work.  Denies any specific injuries.  Denies any loss of bowel or bladder function.  Has been taking over-the-counter pain medication without much relief.     Past Medical History:  Diagnosis Date  . Anxiety   . Arthritis   . Depression   . GERD (gastroesophageal reflux disease)   . Hypertension   . Prediabetes   . Sleep apnea    Past hx - no cpap now     Patient Active Problem List   Diagnosis Date Noted  . Radiculopathy of cervical spine 08/25/2018  . Pain in right hand 07/28/2018  . Trigger finger, right ring finger 07/28/2018  . Mixed hyperlipidemia 06/19/2017  . Gastroesophageal reflux disease 04/15/2017  . OSA (obstructive sleep apnea) 04/15/2017  . Depression 11/28/2016    Past Surgical History:  Procedure Laterality Date  . ABDOMINAL HYSTERECTOMY    . BACK SURGERY    . CESAREAN SECTION    . COLONOSCOPY    . HAND SURGERY    . NECK SURGERY  2002   plate in her neck    OB History    Gravida  1   Para      Term      Preterm      AB      Living  1     SAB      TAB      Ectopic      Multiple      Live Births  1            Home Medications    Prior to Admission medications   Medication Sig Start Date End Date Taking? Authorizing Provider  albuterol (VENTOLIN HFA) 108 (90 Base) MCG/ACT inhaler Inhale 1-2 puffs into the lungs every 6 (six) hours as needed for wheezing or  shortness of breath. 02/19/19  Yes Wieters, Hallie C, PA-C  ASPIRIN 81 PO SMARTSIG:1 Tablet(s) By Mouth Daily 07/02/20  Yes [provider]  atorvastatin (LIPITOR) 10 MG tablet Take 10 mg by mouth. 06/05/20  Yes [provider]  lisinopril (PRINIVIL,ZESTRIL) 10 MG tablet Take 1 tablet (10 mg total) by mouth daily. 08/18/18  Yes Gildardo Pounds, NP  metoprolol tartrate (LOPRESSOR) 25 MG tablet 12.5 mg. 01/04/20  Yes [provider]  traZODone (DESYREL) 50 MG tablet Take 50 mg by mouth at bedtime.   Yes [provider]  venlafaxine XR (EFFEXOR-XR) 150 MG 24 hr capsule Take 1 capsule by mouth in the morning 02/15/19  Yes Gildardo Pounds, NP  omeprazole (PRILOSEC) 20 MG capsule Take 1 capsule (20 mg total) by mouth daily. 02/19/19 07/20/20 Yes Wieters, Hallie C, PA-C  cyclobenzaprine (FLEXERIL) 5 MG tablet Take 1 tablet (5 mg total) by mouth 3 (three) times daily as needed for muscle spasms. 07/20/20   Orvan July, NP  Misc. Devices MISC  Please provide patient with insurance approved supplies for her CPAP: tubing, chin strap and supplies and filters. 11/17/18   Gildardo Pounds, NP  Multiple Vitamin (MULTIVITAMIN) capsule Take by mouth.    [provider]  predniSONE (STERAPRED UNI-PAK 21 TAB) 10 MG (21) TBPK tablet 6 tabs for 1 day, then 5 tabs for 1 das, then 4 tabs for 1 day, then 3 tabs for 1 day, 2 tabs for 1 day, then 1 tab for 1 day 07/20/20   Loura Halt A, NP  amitriptyline (ELAVIL) 75 MG tablet Take 1 tablet (75 mg total) by mouth at bedtime. 10/06/18 07/20/20  Gildardo Pounds, NP  Cetirizine HCl 10 MG CAPS Take 1 capsule (10 mg total) by mouth daily for 10 days. 02/19/19 07/20/20  Wieters, Hallie C, PA-C  fluticasone (FLONASE) 50 MCG/ACT nasal spray Place 2 sprays into both nostrils daily for 14 days. 02/10/19 07/20/20  Gildardo Pounds, NP  sucralfate (CARAFATE) 1 g tablet Take 1 tablet (1 g total) by mouth 3 (three) times daily with meals for 14 days. 03/15/19  07/20/20  Couture, Cortni S, PA-C    Family History Family History  Problem Relation Age of Onset  . Diabetes Father   . Healthy Mother   . Colon cancer Neg Hx   . Colon polyps Neg Hx   . Esophageal cancer Neg Hx   . Rectal cancer Neg Hx   . Stomach cancer Neg Hx     Social History Social History   Tobacco Use  . Smoking status: Current Every Day Smoker    Packs/day: 0.50    Types: Cigarettes  . Smokeless tobacco: Never Used  . Tobacco comment: 0.5 or less   Vaping Use  . Vaping Use: Never used  Substance Use Topics  . Alcohol use: No  . Drug use: No     Allergies   Iodinated diagnostic agents   Review of Systems Review of Systems   Physical Exam Triage Vital Signs ED Triage Vitals [07/20/20 0849]  Enc Vitals Group     BP (!) 156/93     Pulse Rate (!) 109     Resp 18     Temp 98.5 F (36.9 C)     Temp Source Oral     SpO2 99 %     Weight      Height      Head Circumference      Peak Flow      Pain Score 5     Pain Loc      Pain Edu?      Excl. in [place]?    No data found.  Updated Vital Signs BP (!) 156/93 (BP Location: Right Arm)   Pulse (!) 109   Temp 98.5 F (36.9 C) (Oral)   Resp 18   SpO2 99%   Visual Acuity Right Eye Distance:   Left Eye Distance:   Bilateral Distance:    Right Eye Near:   Left Eye Near:    Bilateral Near:     Physical Exam Vitals and nursing note reviewed.  Constitutional:      General: She is not in acute distress.    Appearance: Normal appearance. She is not ill-appearing, toxic-appearing or diaphoretic.  HENT:     Head: Normocephalic.     Nose: Nose normal.  Eyes:     Conjunctiva/sclera: Conjunctivae normal.  Pulmonary:     Effort: Pulmonary effort is normal.  Musculoskeletal:     Cervical back:  Normal range of motion.     Lumbar back: Positive left straight leg raise test.       Back:  Skin:    General: Skin is warm and dry.     Findings: No rash.  Neurological:     Mental Status: She is alert.    Psychiatric:        Mood and Affect: Mood normal.      UC Treatments / Results  Labs (all labs ordered are listed, but only abnormal results are displayed) Labs Reviewed - No data to display  EKG   Radiology No results found.  Procedures Procedures (including critical care time)  Medications Ordered in UC Medications  ketorolac (TORADOL) 30 MG/ML injection 30 mg (30 mg Intramuscular Given 07/20/20 0952)    Initial Impression / Assessment and Plan / UC Course  I have reviewed the triage vital signs and the nursing notes.  Pertinent labs & imaging results that were available during my care of the patient were reviewed by me and considered in my medical decision making (see chart for details).     Sciatic nerve, left Prescribing prednisone daily for the next 6 days with food.  Flexeril as needed for muscle relaxant.  Toradol given here in clinic for pain No concerning red flags.  Follow up as needed for continued or worsening symptoms  Final Clinical Impressions(s) / UC Diagnoses   Final diagnoses:  Compression of sciatic nerve, left     Discharge Instructions     Prednisone daily for 6 days with food.  Flexeril as needed of muscle relaxant.  Toradol given here for pain  Follow up as needed for continued or worsening symptoms      ED Prescriptions    Medication Sig Dispense Auth. Provider   predniSONE (STERAPRED UNI-PAK 21 TAB) 10 MG (21) TBPK tablet 6 tabs for 1 day, then 5 tabs for 1 das, then 4 tabs for 1 day, then 3 tabs for 1 day, 2 tabs for 1 day, then 1 tab for 1 day 21 tablet Nansi Birmingham A, NP   cyclobenzaprine (FLEXERIL) 5 MG tablet Take 1 tablet (5 mg total) by mouth 3 (three) times daily as needed for muscle spasms. 30 tablet Loura Halt A, NP     PDMP not reviewed this encounter.   Orvan July, NP 07/23/20 1649

## 2020-07-26 ENCOUNTER — Ambulatory Visit (HOSPITAL_COMMUNITY): Payer: Self-pay

## 2020-08-04 ENCOUNTER — Encounter (HOSPITAL_COMMUNITY): Payer: Self-pay

## 2020-08-04 ENCOUNTER — Ambulatory Visit (HOSPITAL_COMMUNITY)
Admission: RE | Admit: 2020-08-04 | Discharge: 2020-08-04 | Disposition: A | Discharge status: Home / Self Care | Payer: Medicaid Other | Source: Ambulatory Visit | Attending: Internal Medicine | Admitting: Internal Medicine

## 2020-08-04 ENCOUNTER — Other Ambulatory Visit: Payer: Self-pay

## 2020-08-04 VITALS — BP 144/71 | HR 106 | Temp 99.8°F | Resp 15 | Ht 62.0 in | Wt 160.1 lb

## 2020-08-04 DIAGNOSIS — R1084 Generalized abdominal pain: Secondary | ICD-10-CM | POA: Diagnosis present

## 2020-08-04 DIAGNOSIS — Z20822 Contact with and (suspected) exposure to covid-19: Secondary | ICD-10-CM | POA: Insufficient documentation

## 2020-08-04 DIAGNOSIS — R197 Diarrhea, unspecified: Secondary | ICD-10-CM | POA: Diagnosis not present

## 2020-08-04 LAB — SARS CORONAVIRUS 2 (TAT 6-24 HRS): SARS Coronavirus 2: NEGATIVE

## 2020-08-04 NOTE — ED Provider Notes (Signed)
[place]    CSN: [id number] Arrival date & time: 08/04/20  1638      History   Chief Complaint No chief complaint on file. Abdominal Pain/diarrhea  HPI Lauren [name] is a 54 y.o. female presenting today for evaluation of abdominal pain and diarrhea. Reports symptoms began last night. Denies URI symptoms. Denies fevers. Symptoms slightly subsided today. Tolerating oral intake. Denies nausea or vomiting. Has underlying GERD, but denies any other underlying GI problems. Has had prior hysterectomy and C-sections.  HPI  Past Medical History:  Diagnosis Date   Anxiety    Arthritis    Depression    GERD (gastroesophageal reflux disease)    Hypertension    Prediabetes    Sleep apnea    Past hx - no cpap now     Patient Active Problem List   Diagnosis Date Noted   Radiculopathy of cervical spine 08/25/2018   Pain in right hand 07/28/2018   Trigger finger, right ring finger 07/28/2018   Mixed hyperlipidemia 06/19/2017   Gastroesophageal reflux disease 04/15/2017   OSA (obstructive sleep apnea) 04/15/2017   Depression 11/28/2016    Past Surgical History:  Procedure Laterality Date   ABDOMINAL HYSTERECTOMY     BACK SURGERY     CESAREAN SECTION     COLONOSCOPY     HAND SURGERY     NECK SURGERY  2002   plate in her neck    OB History    Gravida  1   Para      Term      Preterm      AB      Living  1     SAB      TAB      Ectopic      Multiple      Live Births  1            Home Medications    Prior to Admission medications   Medication Sig Start Date End Date Taking? Authorizing Provider  ASPIRIN 81 PO SMARTSIG:1 Tablet(s) By Mouth Daily 07/02/20  Yes [provider]  atorvastatin (LIPITOR) 10 MG tablet Take 10 mg by mouth. 06/05/20  Yes [provider]  lisinopril (PRINIVIL,ZESTRIL) 10 MG tablet Take 1 tablet (10 mg total) by mouth daily. 08/18/18  Yes Gildardo Pounds, NP  omeprazole  (PRILOSEC) 20 MG capsule Take 20 mg by mouth daily.   Yes [provider]  venlafaxine XR (EFFEXOR-XR) 150 MG 24 hr capsule Take 1 capsule by mouth in the morning 02/15/19  Yes Gildardo Pounds, NP  albuterol (VENTOLIN HFA) 108 (90 Base) MCG/ACT inhaler Inhale 1-2 puffs into the lungs every 6 (six) hours as needed for wheezing or shortness of breath. 02/19/19   Milbern Doescher C, PA-C  cyclobenzaprine (FLEXERIL) 5 MG tablet Take 1 tablet (5 mg total) by mouth 3 (three) times daily as needed for muscle spasms. 07/20/20   Loura Halt A, NP  metoprolol tartrate (LOPRESSOR) 25 MG tablet 12.5 mg. 01/04/20   [provider]  Misc. Devices MISC Please provide patient with insurance approved supplies for her CPAP: tubing, chin strap and supplies and filters. 11/17/18   Gildardo Pounds, NP  Multiple Vitamin (MULTIVITAMIN) capsule Take by mouth.    [provider]  predniSONE (STERAPRED UNI-PAK 21 TAB) 10 MG (21) TBPK tablet 6 tabs for 1 day, then 5 tabs for 1 das, then 4 tabs for 1 day, then 3 tabs for 1 day,  2 tabs for 1 day, then 1 tab for 1 day 07/20/20   Loura Halt A, NP  traZODone (DESYREL) 50 MG tablet Take 50 mg by mouth at bedtime.    [provider]  amitriptyline (ELAVIL) 75 MG tablet Take 1 tablet (75 mg total) by mouth at bedtime. 10/06/18 07/20/20  Gildardo Pounds, NP  Cetirizine HCl 10 MG CAPS Take 1 capsule (10 mg total) by mouth daily for 10 days. 02/19/19 07/20/20  Ioma Chismar C, PA-C  fluticasone (FLONASE) 50 MCG/ACT nasal spray Place 2 sprays into both nostrils daily for 14 days. 02/10/19 07/20/20  Gildardo Pounds, NP  sucralfate (CARAFATE) 1 g tablet Take 1 tablet (1 g total) by mouth 3 (three) times daily with meals for 14 days. 03/15/19 07/20/20  Couture, Cortni S, PA-C    Family History Family History  Problem Relation Age of Onset   Diabetes Father    Healthy Mother    Colon cancer Neg Hx    Colon polyps Neg Hx    Esophageal cancer Neg Hx     Rectal cancer Neg Hx    Stomach cancer Neg Hx     Social History Social History   Tobacco Use   Smoking status: Current Every Day Smoker    Packs/day: 0.50    Types: Cigarettes   Smokeless tobacco: Never Used   Tobacco comment: 0.5 or less   Vaping Use   Vaping Use: Never used  Substance Use Topics   Alcohol use: No   Drug use: No     Allergies   Iodinated diagnostic agents   Review of Systems Review of Systems  Constitutional: Negative for activity change, appetite change, chills, fatigue and fever.  HENT: Negative for congestion, ear pain, rhinorrhea, sinus pressure, sore throat and trouble swallowing.   Eyes: Negative for discharge and redness.  Respiratory: Negative for cough, chest tightness and shortness of breath.   Cardiovascular: Negative for chest pain.  Gastrointestinal: Positive for abdominal pain and diarrhea. Negative for nausea and vomiting.  Musculoskeletal: Negative for myalgias.  Skin: Negative for rash.  Neurological: Negative for dizziness, light-headedness and headaches.     Physical Exam Triage Vital Signs ED Triage Vitals  Enc Vitals Group     BP 08/04/20 1724 (!) 144/71     Pulse Rate 08/04/20 1724 (!) 106     Resp 08/04/20 1724 15     Temp 08/04/20 1724 99.8 F (37.7 C)     Temp Source 08/04/20 1724 Oral     SpO2 08/04/20 1724 98 %     Weight 08/04/20 1722 160 lb 0.9 oz (72.6 kg)     Height 08/04/20 1722 5\' 2"  (1.575 m)     Head Circumference --      Peak Flow --      Pain Score 08/04/20 1721 6     Pain Loc --      Pain Edu? --      Excl. in [place]? --    No data found.  Updated Vital Signs BP (!) 144/71 (BP Location: Left Arm)    Pulse (!) 106    Temp 99.8 F (37.7 C) (Oral)    Resp 15    Ht 5\' 2"  (1.575 m)    Wt 160 lb 0.9 oz (72.6 kg)    SpO2 98%    BMI 29.27 kg/m   Visual Acuity Right Eye Distance:   Left Eye Distance:   Bilateral Distance:    Right Eye Near:  Left Eye Near:    Bilateral Near:     Physical  Exam Vitals and nursing note reviewed.  Constitutional:      Appearance: She is well-developed.     Comments: No acute distress  HENT:     Head: Normocephalic and atraumatic.     Nose: Nose normal.  Eyes:     Conjunctiva/sclera: Conjunctivae normal.  Cardiovascular:     Rate and Rhythm: Normal rate.  Pulmonary:     Effort: Pulmonary effort is normal. No respiratory distress.     Comments: Breathing comfortably at rest, CTABL, no wheezing, rales or other adventitious sounds auscultated Abdominal:     General: There is no distension.     Comments: Soft, nondistended, nontender light deep palpation throughout abdomen  Musculoskeletal:        General: Normal range of motion.     Cervical back: Neck supple.  Skin:    General: Skin is warm and dry.  Neurological:     Mental Status: She is alert and oriented to person, place, and time.      UC Treatments / Results  Labs (all labs ordered are listed, but only abnormal results are displayed) Labs Reviewed  SARS CORONAVIRUS 2 (TAT 6-24 HRS)    EKG   Radiology No results found.  Procedures Procedures (including critical care time)  Medications Ordered in UC Medications - No data to display  Initial Impression / Assessment and Plan / UC Course  I have reviewed the triage vital signs and the nursing notes.  Pertinent labs & imaging results that were available during my care of the patient were reviewed by me and considered in my medical decision making (see chart for details).     Covid test pending, suspect likely viral gastroenteritis and recommending symptomatic and supportive care with continued close monitoring. Rest and fluids.  Discussed strict return precautions. Patient verbalized understanding and is agreeable with plan.  Final Clinical Impressions(s) / UC Diagnoses   Final diagnoses:  Generalized abdominal pain  Diarrhea, unspecified type     Discharge Instructions     Rest and fluids Tylenol for  fever Follow up if not improving or worsening    ED Prescriptions    None     PDMP not reviewed this encounter.   Janith Lima, [place] 08/04/20 1757

## 2020-08-04 NOTE — ED Triage Notes (Signed)
Patient c/o ABD cramps and diarrhea x 1 day.   Patient hasn't taken anything for symptoms at home.   Pain 6/10.

## 2020-08-04 NOTE — Discharge Instructions (Signed)
Rest and fluids Tylenol for fever Follow up if not improving or worsening

## 2020-09-01 ENCOUNTER — Other Ambulatory Visit: Payer: Self-pay

## 2020-09-01 ENCOUNTER — Ambulatory Visit: Payer: Self-pay | Attending: Nurse Practitioner | Admitting: Nurse Practitioner

## 2020-09-01 DIAGNOSIS — G8929 Other chronic pain: Secondary | ICD-10-CM

## 2020-09-01 DIAGNOSIS — I10 Essential (primary) hypertension: Secondary | ICD-10-CM

## 2020-09-01 DIAGNOSIS — F32A Depression, unspecified: Secondary | ICD-10-CM

## 2020-09-01 DIAGNOSIS — F419 Anxiety disorder, unspecified: Secondary | ICD-10-CM

## 2020-09-01 DIAGNOSIS — M5442 Lumbago with sciatica, left side: Secondary | ICD-10-CM

## 2020-09-01 MED ORDER — MELOXICAM 15 MG PO TABS: 15.0000 mg | ORAL_TABLET | Freq: Every day | ORAL | 1 refills | Status: AC | PRN

## 2020-09-01 MED ORDER — GABAPENTIN 100 MG PO CAPS: 100.0000 mg | ORAL_CAPSULE | Freq: Three times a day (TID) | ORAL | 3 refills | Status: DC

## 2020-09-01 MED ORDER — CYCLOBENZAPRINE HCL 10 MG PO TABS: 10.0000 mg | ORAL_TABLET | Freq: Three times a day (TID) | ORAL | 1 refills | Status: AC | PRN

## 2020-09-01 NOTE — Progress Notes (Signed)
Virtual Visit via Telephone Note Due to national recommendations of social distancing due to [place] 19, telehealth visit is felt to be most appropriate for this patient at this time.  I discussed the limitations, risks, security and privacy concerns of performing an evaluation and management service by telephone and the availability of in person appointments. I also discussed with the patient that there may be a patient responsible charge related to this service. The patient expressed understanding and agreed to proceed.    I connected with [name] on 09/01/20  at   3:10 PM EST  EDT by telephone and verified that I am speaking with the correct person using two identifiers.   Consent I discussed the limitations, risks, security and privacy concerns of performing an evaluation and management service by telephone and the availability of in person appointments. I also discussed with the patient that there may be a patient responsible charge related to this service. The patient expressed understanding and agreed to proceed.   Location of Patient: Private Residence   Location of Provider: [place] [facility] [employer] Office    Persons participating in Telemedicine visit: [name] FNP-BC [place]    History of Present Illness: Telemedicine visit for: Re establish care PMH: Anxiety, Arthritis, Depression, GERD, chronic back pain with sciatica, Hypertension, Prediabetes, and Sleep apnea.  She saw ortho in may with complaints of low back pain with radiation through the posterior buttock and posterior lateral leg. She has a history of microdiscectomy 2014. During her visit with Ortho she stated her pain had completely resolved since her injection. She had also reportedly stopped taking flexeril. Instructed to follow up as needed.   Today she states her symptoms have returned and she is experiencing left sided sciatica. We will start her on gabapentin 100 mg  TID. She has been on gabapentin as high as 400 mg TID.  Will refill her flexeril and prn meloxicam. She will follow up in a few weeks.   Anxiety/Depression She declines starting SSRI today. Continues on trazodone for sleep. No longer taking effexor XR 150 mg daily.    Essential HTN Blood pressure is not well controlled. She is not monitoring her blood pressure at home. Currently taking lisinopril 10 mg daily and metoprolol 12.5 mg BID as prescribed. .Denies chest pain, shortness of breath, palpitations, lightheadedness, dizziness, headaches or BLE edema.  BP Readings from Last 3 Encounters:  08/04/20 (!) 144/71  07/20/20 (!) 156/93  01/21/20 (!) 149/82     Past Medical History:  Diagnosis Date   Anxiety    Arthritis    Depression    GERD (gastroesophageal reflux disease)    Hypertension    Prediabetes    Sleep apnea    Past hx - no cpap now     Past Surgical History:  Procedure Laterality Date   ABDOMINAL HYSTERECTOMY     BACK SURGERY     CESAREAN SECTION     COLONOSCOPY     HAND SURGERY     NECK SURGERY  2002   plate in her neck    Family History  Problem Relation Age of Onset   Diabetes Father    Healthy Mother    Colon cancer Neg Hx    Colon polyps Neg Hx    Esophageal cancer Neg Hx    Rectal cancer Neg Hx    Stomach cancer Neg Hx     Social History   Socioeconomic History   Marital status:  Single    Spouse name: Not on file   Number of children: Not on file   Years of education: Not on file   Highest education level: Not on file  Occupational History   Not on file  Tobacco Use   Smoking status: Current Every Day Smoker    Packs/day: 0.50    Types: Cigarettes   Smokeless tobacco: Never Used   Tobacco comment: 0.5 or less   Vaping Use   Vaping Use: Never used  Substance and Sexual Activity   Alcohol use: No   Drug use: No   Sexual activity: Not Currently    Birth control/protection: None  Other Topics Concern    Not on file  Social History Narrative   Not on file   Social Determinants of Health   Financial Resource Strain:    Difficulty of Paying Living Expenses: Not on file  Food Insecurity:    Worried About Running Out of Food in the Last Year: Not on file   [employer] of Food in the Last Year: Not on file  Transportation Needs:    Lack of Transportation (Medical): Not on file   Lack of Transportation (Non-Medical): Not on file  Physical Activity:    Days of Exercise per Week: Not on file   Minutes of Exercise per Session: Not on file  Stress:    Feeling of Stress : Not on file  Social Connections:    Frequency of Communication with Friends and Family: Not on file   Frequency of Social Gatherings with Friends and Family: Not on file   Attends Religious Services: Not on file   Active Member of Clubs or Organizations: Not on file   Attends Archivist Meetings: Not on file   Marital Status: Not on file     Observations/Objective: Awake, alert and oriented x 3   Review of Systems  Constitutional: Negative for fever, malaise/fatigue and weight loss.  HENT: Negative.  Negative for nosebleeds.   Eyes: Negative.  Negative for blurred vision, double vision and photophobia.  Respiratory: Negative.  Negative for cough and shortness of breath.   Cardiovascular: Negative.  Negative for chest pain, palpitations and leg swelling.  Gastrointestinal: Negative.  Negative for heartburn, nausea and vomiting.  Musculoskeletal: Positive for back pain. Negative for myalgias.  Neurological: Positive for sensory change. Negative for dizziness, focal weakness, seizures and headaches.  Psychiatric/Behavioral: Positive for depression. Negative for suicidal ideas. The patient is nervous/anxious and has insomnia.     Assessment and Plan: Lauren [name] was seen today for sciatica.  Diagnoses and all orders for this visit:  Chronic bilateral low back pain with left-sided sciatica -      cyclobenzaprine (FLEXERIL) 10 MG tablet; Take 1 tablet (10 mg total) by mouth 3 (three) times daily as needed for muscle spasms. -     meloxicam (MOBIC) 15 MG tablet; Take 1 tablet (15 mg total) by mouth daily as needed for pain. -     gabapentin (NEURONTIN) 100 MG capsule; Take 1 capsule (100 mg total) by mouth 3 (three) times daily. Work on losing weight to help reduce back pain. May alternate with heat and ice application for pain relief. May also alternate with acetaminophen as prescribed for back pain. Other alternatives include massage, acupuncture and water aerobics.  You must stay active and avoid a sedentary lifestyle.    Anxiety and depression Continue trazodone and gabapentin as prescribed.   Essential hypertension -     metoprolol tartrate (LOPRESSOR)  25 MG tablet; Take 0.5 tablets (12.5 mg total) by mouth 2 (two) times daily. Continue all antihypertensives as prescribed.  Remember to bring in your blood pressure log with you for your follow up appointment.  DASH/Mediterranean Diets are healthier choices for HTN.     Follow Up Instructions No follow-ups on file.     I discussed the assessment and treatment plan with the patient. The patient was provided an opportunity to ask questions and all were answered. The patient agreed with the plan and demonstrated an understanding of the instructions.   The patient was advised to call back or seek an in-person evaluation if the symptoms worsen or if the condition fails to improve as anticipated.  I provided 21 minutes of non-face-to-face time during this encounter including median intraservice time, reviewing previous notes, labs, imaging, medications and explaining diagnosis and management.  Gildardo Pounds, FNP-BC

## 2020-09-01 NOTE — Progress Notes (Signed)
Having pain down her left side, wants to know what she can take.

## 2020-09-01 NOTE — Progress Notes (Signed)
Attempted to call Ms. Hamil. LVM

## 2020-09-04 ENCOUNTER — Encounter: Payer: Self-pay | Admitting: Nurse Practitioner

## 2020-09-04 MED ORDER — METOPROLOL TARTRATE 25 MG PO TABS: 12.5000 mg | ORAL_TABLET | Freq: Two times a day (BID) | ORAL | 0 refills | Status: DC

## 2020-09-11 ENCOUNTER — Other Ambulatory Visit: Payer: Self-pay | Admitting: Nurse Practitioner

## 2020-09-11 DIAGNOSIS — I10 Essential (primary) hypertension: Secondary | ICD-10-CM

## 2020-09-11 DIAGNOSIS — F339 Major depressive disorder, recurrent, unspecified: Secondary | ICD-10-CM

## 2020-09-11 MED ORDER — LISINOPRIL 10 MG PO TABS: 10.0000 mg | ORAL_TABLET | Freq: Every day | ORAL | 0 refills | Status: DC

## 2020-09-11 MED ORDER — VENLAFAXINE HCL ER 150 MG PO CP24: ORAL_CAPSULE | ORAL | 0 refills | Status: DC

## 2020-09-11 MED FILL — VENLAFAXINE HCL ER 150 MG C: 150 | 30 days supply | Qty: 30 | Fill #0

## 2020-09-11 NOTE — Telephone Encounter (Signed)
Copied from [place] [id number] [id number]. Topic: Quick Communication - Rx Refill/Question >> Sep 11, 2020 10:22 AM Leward Quan A wrote: Medication: venlafaxine XR (EFFEXOR-XR) 150 MG 24 hr capsule, lisinopril (PRINIVIL,ZESTRIL) 10 MG tablet Per patient she is completely out of the Effexor   Has the patient contacted their pharmacy? Yes.   (Agent: If no, request that the patient contact the pharmacy for the refill.) (Agent: If yes, when and what did the pharmacy advise?)  Preferred Pharmacy (with phone number or street name): [facility] [place] [facility]  Phone:  [phone] Fax:  [phone]     Agent: Please be advised that RX refills may take up to 3 business days. We ask that you follow-up with your pharmacy.

## 2020-09-19 ENCOUNTER — Telehealth: Payer: Self-pay | Admitting: Nurse Practitioner

## 2020-09-19 MED FILL — LISINOPRIL 10 MG TABS: 10 | 30 days supply | Qty: 30 | Fill #0

## 2020-09-19 NOTE — Telephone Encounter (Signed)
Gabapentin and Flexeril are not helping left leg pain about 4 hours after patient is at work. Patient wanted to know if there is something over the counter she can take or if she can get an increased dosage of the current medications.

## 2020-09-22 ENCOUNTER — Other Ambulatory Visit: Payer: Self-pay | Admitting: Nurse Practitioner

## 2020-09-22 MED ORDER — DULOXETINE HCL 30 MG PO CPEP: 30.0000 mg | ORAL_CAPSULE | Freq: Every day | ORAL | 3 refills | Status: DC

## 2020-09-22 MED FILL — DULoxetine HCL 30 MG CPEP: 30 | 30 days supply | Qty: 30 | Fill #0

## 2020-09-22 NOTE — Telephone Encounter (Signed)
Cymbalta has been sent. Needs to also wear back brace at work

## 2020-09-22 NOTE — Telephone Encounter (Signed)
Spoke to patient and informed on PCP advising. Pt. Understood and will call back in 2 weeks if the Cymbalta is not helping.

## 2020-09-28 ENCOUNTER — Other Ambulatory Visit: Payer: Self-pay | Admitting: Family Medicine

## 2020-09-28 ENCOUNTER — Other Ambulatory Visit: Payer: Self-pay | Admitting: Nurse Practitioner

## 2020-09-28 MED ORDER — ASPIRIN 81 MG PO TBEC: 81.0000 mg | DELAYED_RELEASE_TABLET | Freq: Every day | ORAL | 12 refills | Status: DC

## 2020-09-28 MED ORDER — ATORVASTATIN CALCIUM 10 MG PO TABS
10.0000 mg | ORAL_TABLET | Freq: Every day | ORAL | 2 refills | Status: DC
Start: 2020-09-28 — End: 2021-01-03

## 2020-09-28 MED FILL — ATORVASTATIN 10 MG TABLET: 10 | 30 days supply | Qty: 30 | Fill #0

## 2020-09-28 NOTE — Telephone Encounter (Signed)
Notes to clinic:  medications filled by historical providers Review for refills   Requested Prescriptions  Pending Prescriptions Disp Refills   aspirin (ASPIRIN 81) 81 MG EC tablet 30 tablet 12    Sig: Take by mouth daily.      Analgesics:  NSAIDS - aspirin Passed - 09/28/2020  9:56 AM      Passed - Patient is not pregnant      Passed - Valid encounter within last 12 months    Recent Outpatient Visits           3 weeks ago Chronic bilateral low back pain with left-sided sciatica   [place], Vernia Buff, NP   1 year ago Essential hypertension   [place], Vernia Buff, NP   2 years ago Essential hypertension   [place], Vernia Buff, NP   2 years ago Epigastric pain   [place], Vernia Buff, NP   2 years ago Essential hypertension   [place], Zelda W, NP       Future Appointments             In 1 month Gildardo Pounds, NP [place]               atorvastatin (LIPITOR) 10 MG tablet      Sig: Take 1 tablet (10 mg total) by mouth.      Cardiovascular:  Antilipid - Statins Failed - 09/28/2020  9:56 AM      Failed - Total Cholesterol in normal range and within 360 days    Cholesterol, Total  Date Value Ref Range Status  06/12/2017 205 (H) 100 - 199 mg/dL Final          Failed - LDL in normal range and within 360 days    LDL Calculated  Date Value Ref Range Status  06/12/2017 135 (H) 0 - 99 mg/dL Final          Failed - HDL in normal range and within 360 days    HDL  Date Value Ref Range Status  06/12/2017 57 >39 mg/dL Final          Failed - Triglycerides in normal range and within 360 days    Triglycerides  Date Value Ref Range Status  06/12/2017 66 0 - 149 mg/dL Final          Passed - Patient is not pregnant      Passed - Valid  encounter within last 12 months    Recent Outpatient Visits           3 weeks ago Chronic bilateral low back pain with left-sided sciatica   [place], Vernia Buff, NP   1 year ago Essential hypertension   [place], Vernia Buff, NP   2 years ago Essential hypertension   [place], Vernia Buff, NP   2 years ago Epigastric pain   [place], Vernia Buff, NP   2 years ago Essential hypertension   [place], Vernia Buff, NP       Future Appointments             In 1 month Gildardo Pounds, NP [facility]

## 2020-09-28 NOTE — Telephone Encounter (Signed)
Pt request refill  atorvastatin (LIPITOR) 10 MG tablet ASPIRIN 81 PO  Pt is new to [facility] and [facility] has never written these for her.  [place] [place] [employer] Phone:  [phone]  Fax:  [phone]

## 2020-10-03 ENCOUNTER — Other Ambulatory Visit: Payer: Self-pay | Admitting: Nurse Practitioner

## 2020-10-03 ENCOUNTER — Telehealth: Payer: Self-pay | Admitting: Nurse Practitioner

## 2020-10-03 DIAGNOSIS — G8929 Other chronic pain: Secondary | ICD-10-CM

## 2020-10-03 MED ORDER — GABAPENTIN 300 MG PO CAPS: 300.0000 mg | ORAL_CAPSULE | Freq: Three times a day (TID) | ORAL | 0 refills | Status: DC

## 2020-10-03 NOTE — Telephone Encounter (Signed)
Copied from [place] [id number]. Topic: General - Other >> Oct 02, 2020 10:28 AM Hinda Lenis D wrote: PT need some advise, she still in pain need a stronger doze. Left leg / gabapentin (NEURONTIN) 100 MG capsule [[id number]] please advise

## 2020-10-03 NOTE — Telephone Encounter (Signed)
Gabapentin increased. Has she even picked up the Cymbalta from the 3rd?

## 2020-10-04 MED FILL — GABAPENTIN 300 MG CAPSULE: 300 | 30 days supply | Qty: 90 | Fill #0

## 2020-10-11 MED FILL — DULoxetine HCL 30 MG CPEP: 30 | 30 days supply | Qty: 30 | Fill #1

## 2020-10-11 MED FILL — VENLAFAXINE HCL ER 150 MG C: 150 | 30 days supply | Qty: 30 | Fill #1

## 2020-10-11 NOTE — Telephone Encounter (Signed)
Spoke to patient. Pt. Is aware of increase of Gabapentin and stated she has not pick up refill for Cymbalta.  CMA informed patient that she does have refill and is aware she can stop by the pharmacy for pick up.

## 2020-10-17 MED FILL — LISINOPRIL 10 MG TABS: 10 | 30 days supply | Qty: 30 | Fill #1

## 2020-10-31 ENCOUNTER — Encounter: Payer: Self-pay | Admitting: Nurse Practitioner

## 2020-10-31 ENCOUNTER — Ambulatory Visit: Payer: Self-pay | Attending: Nurse Practitioner

## 2020-10-31 ENCOUNTER — Other Ambulatory Visit: Payer: Self-pay | Admitting: Nurse Practitioner

## 2020-10-31 ENCOUNTER — Ambulatory Visit: Payer: Self-pay | Attending: Nurse Practitioner | Admitting: Nurse Practitioner

## 2020-10-31 ENCOUNTER — Other Ambulatory Visit: Payer: Self-pay

## 2020-10-31 VITALS — BP 138/79 | HR 75 | Temp 98.4°F | Ht 63.82 in | Wt 169.0 lb

## 2020-10-31 DIAGNOSIS — I10 Essential (primary) hypertension: Secondary | ICD-10-CM

## 2020-10-31 DIAGNOSIS — Z114 Encounter for screening for human immunodeficiency virus [HIV]: Secondary | ICD-10-CM

## 2020-10-31 DIAGNOSIS — E782 Mixed hyperlipidemia: Secondary | ICD-10-CM

## 2020-10-31 DIAGNOSIS — R7303 Prediabetes: Secondary | ICD-10-CM

## 2020-10-31 DIAGNOSIS — M5442 Lumbago with sciatica, left side: Secondary | ICD-10-CM

## 2020-10-31 DIAGNOSIS — Z130 Encounter for screening for diseases of the blood and blood-forming organs and certain disorders involving the immune mechanism: Secondary | ICD-10-CM

## 2020-10-31 DIAGNOSIS — G8929 Other chronic pain: Secondary | ICD-10-CM

## 2020-10-31 DIAGNOSIS — Z1159 Encounter for screening for other viral diseases: Secondary | ICD-10-CM

## 2020-10-31 DIAGNOSIS — K219 Gastro-esophageal reflux disease without esophagitis: Secondary | ICD-10-CM

## 2020-10-31 LAB — GLUCOSE, POCT (MANUAL RESULT ENTRY): POC Glucose: 107 mg/dl — AB (ref 70–99)

## 2020-10-31 LAB — POCT GLYCOSYLATED HEMOGLOBIN (HGB A1C): HbA1c, POC (prediabetic range): 5.7 % (ref 5.7–6.4)

## 2020-10-31 MED ORDER — OMEPRAZOLE 40 MG PO CPDR: 40.0000 mg | DELAYED_RELEASE_CAPSULE | Freq: Every day | ORAL | 1 refills | Status: DC

## 2020-10-31 MED ORDER — PREGABALIN 75 MG PO CAPS: 75.0000 mg | ORAL_CAPSULE | Freq: Two times a day (BID) | ORAL | 1 refills | Status: DC

## 2020-10-31 MED FILL — OMEPRAZOLE DR 40 MG CAPSULE: 40 | 30 days supply | Qty: 30 | Fill #0

## 2020-10-31 MED FILL — PREGABALIN 75 MG CAPS: 75 | 30 days supply | Qty: 60 | Fill #0

## 2020-10-31 NOTE — Progress Notes (Signed)
Assessment & Plan:  Lauren [name] was seen today for leg pain.  Diagnoses and all orders for this visit:  Mixed hyperlipidemia -     Lipid  INSTRUCTIONS: Work on a low fat, heart healthy diet and participate in regular aerobic exercise program by working out at least 150 minutes per week; 5 days a week-30 minutes per day. Avoid red meat/beef/steak,  fried foods. junk foods, sodas, sugary drinks, unhealthy snacking, alcohol and smoking.  Drink at least 80 oz of water per day and monitor your carbohydrate intake daily.    Prediabetes -     Glucose (CBG) -     HgB A1c -     TSH  Need for hepatitis C screening test -     HCV Ab w Reflex to Quant PCR -     omeprazole (PRILOSEC) 40 MG capsule; Take 1 capsule (40 mg total) by mouth daily.  Encounter for screening for HIV -     HIV antibody (with reflex)  Essential hypertension -     CMP14+EGFR  Screening for deficiency anemia -     CBC  Gastroesophageal reflux disease without esophagitis -     omeprazole (PRILOSEC) 40 MG capsule; Take 1 capsule (40 mg total) by mouth daily.  Chronic left-sided low back pain with left-sided sciatica -     pregabalin (LYRICA) 75 MG capsule; Take 1 capsule (75 mg total) by mouth 2 (two) times daily. Work on losing weight to help reduce back pain. May alternate with heat and ice application for pain relief. May also alternate with acetaminophen and Ibuprofen as prescribed for back pain. Other alternatives include massage, acupuncture and water aerobics.  You must stay active and avoid a sedentary lifestyle.    Patient has been counseled on age-appropriate routine health concerns for screening and prevention. These are reviewed and up-to-date. Referrals have been placed accordingly. Immunizations are up-to-date or declined.    Subjective:   Chief Complaint  Patient presents with  . Leg Pain    Patient stated she is having left leg pain. She stated she was seeing an Orthopedic and they gave her shot, but  due to lack of insurance she has not seen them.    HPI Lauren [name] 55 y.o. female presents to office today for follow up to back pain.   Chronic low Back pain  Left sided low back pain with left sided sciatica. Chronic and ongoing for over a year. Treatments tried: meloxicam, cyclobenzaprine, gabapentin, Cymbalta, toradol injection, methocarbamol. She has a history of cervical radiculopathy as well.  Pain described as sharp starting in the left hip and radiating down into the lower leg and calf.  Worse with walking and sitting for long periods of time. Denies any involuntary loss of urine or stool.    Essential Hypertension Blood pressure is well controlled taking lisinopril 10 mg daily. She has not been taking lopressor 12.5 mg bid as prescribed. She is aware it should be taken as instructed.  BP Readings from Last 3 Encounters:  10/31/20 138/79  08/04/20 (!) 144/71  07/20/20 (!) 156/93    Prediabetes Controlled with diet only at this time. Cholesterol levels not at goal although she endorses adherence taking atorvastatin 10 mg daily.  Lab Results  Component Value Date   HGBA1C 5.7 10/31/2020   Lab Results  Component Value Date   LDLCALC 135 (H) 06/12/2017   Review of Systems  Constitutional: Negative for fever, malaise/fatigue and weight loss.  HENT: Negative.  Negative  for nosebleeds.   Eyes: Negative.  Negative for blurred vision, double vision and photophobia.  Respiratory: Negative.  Negative for cough and shortness of breath.   Cardiovascular: Negative.  Negative for chest pain, palpitations and leg swelling.  Gastrointestinal: Positive for heartburn. Negative for abdominal pain, blood in stool, constipation, diarrhea, melena, nausea and vomiting.  Musculoskeletal: Positive for back pain and joint pain. Negative for myalgias.  Neurological: Positive for sensory change. Negative for dizziness, focal weakness, seizures and headaches.  Psychiatric/Behavioral: Negative.   Negative for suicidal ideas.    Past Medical History:  Diagnosis Date  . Anxiety   . Arthritis   . Depression   . GERD (gastroesophageal reflux disease)   . Hypertension   . Prediabetes   . Sleep apnea    Past hx - no cpap now     Past Surgical History:  Procedure Laterality Date  . ABDOMINAL HYSTERECTOMY    . BACK SURGERY    . CESAREAN SECTION    . COLONOSCOPY    . HAND SURGERY    . NECK SURGERY  2002   plate in her neck    Family History  Problem Relation Age of Onset  . Diabetes Father   . Healthy Mother   . Colon cancer Neg Hx   . Colon polyps Neg Hx   . Esophageal cancer Neg Hx   . Rectal cancer Neg Hx   . Stomach cancer Neg Hx     Social History Reviewed with no changes to be made today.   Outpatient Medications Prior to Visit  Medication Sig Dispense Refill  . atorvastatin (LIPITOR) 10 MG tablet Take 1 tablet (10 mg total) by mouth daily. 30 tablet 2  . DULoxetine (CYMBALTA) 30 MG capsule Take 1 capsule (30 mg total) by mouth daily. 30 capsule 3  . lisinopril (ZESTRIL) 10 MG tablet Take 1 tablet (10 mg total) by mouth daily. 90 tablet 0  . metoprolol tartrate (LOPRESSOR) 25 MG tablet Take 0.5 tablets (12.5 mg total) by mouth 2 (two) times daily. (Patient taking differently: Take 12.5 mg by mouth 2 (two) times daily. PRN) 90 tablet 0  . Misc. Devices MISC Please provide patient with insurance approved supplies for her CPAP: tubing, chin strap and supplies and filters. 1 each 0  . Multiple Vitamin (MULTIVITAMIN) capsule Take by mouth.    . traZODone (DESYREL) 50 MG tablet Take 50 mg by mouth at bedtime.    Marland Kitchen venlafaxine XR (EFFEXOR-XR) 150 MG 24 hr capsule Take 1 capsule by mouth in the morning 90 capsule 0  . gabapentin (NEURONTIN) 300 MG capsule Take 1 capsule (300 mg total) by mouth 3 (three) times daily. 90 capsule 0  . omeprazole (PRILOSEC) 20 MG capsule Take 20 mg by mouth daily.    Marland Kitchen albuterol (VENTOLIN HFA) 108 (90 Base) MCG/ACT inhaler Inhale 1-2 puffs  into the lungs every 6 (six) hours as needed for wheezing or shortness of breath. (Patient not taking: No sig reported) 1 Inhaler 0  . aspirin (ASPIRIN 81) 81 MG EC tablet Take 1 tablet (81 mg total) by mouth daily. (Patient not taking: Reported on 10/31/2020) 30 tablet 12   No facility-administered medications prior to visit.    Allergies  Allergen Reactions  . Iodinated Diagnostic Agents Nausea Only       Objective:    BP 138/79 (BP Location: Left Arm, Patient Position: Sitting, Cuff Size: Large)   Pulse 75   Temp 98.4 F (36.9 C) (Oral)  Ht 5' 3.82" (1.621 m)   Wt 169 lb (76.7 kg)   SpO2 100%   BMI 29.17 kg/m  Wt Readings from Last 3 Encounters:  10/31/20 169 lb (76.7 kg)  08/04/20 160 lb 0.9 oz (72.6 kg)  01/21/20 160 lb (72.6 kg)    Physical Exam Vitals and nursing note reviewed.  Constitutional:      Appearance: She is well-developed and well-nourished.  HENT:     Head: Normocephalic and atraumatic.  Eyes:     Extraocular Movements: EOM normal.  Cardiovascular:     Rate and Rhythm: Normal rate and regular rhythm.     Pulses: Intact distal pulses.     Heart sounds: Normal heart sounds. No murmur heard. No friction rub. No gallop.   Pulmonary:     Effort: Pulmonary effort is normal. No tachypnea or respiratory distress.     Breath sounds: Normal breath sounds. No decreased breath sounds, wheezing, rhonchi or rales.  Chest:     Chest wall: No tenderness.  Abdominal:     General: Bowel sounds are normal.     Palpations: Abdomen is soft.  Musculoskeletal:        General: No swelling, tenderness, deformity, signs of injury or edema. Normal range of motion.     Cervical back: Normal range of motion.     Right lower leg: No swelling, tenderness or bony tenderness. No edema.     Left lower leg: No swelling, tenderness or bony tenderness. No edema.  Skin:    General: Skin is warm and dry.  Neurological:     Mental Status: She is alert and oriented to person,  place, and time.     Coordination: Coordination normal.  Psychiatric:        Mood and Affect: Mood and affect normal.        Behavior: Behavior normal. Behavior is cooperative.        Thought Content: Thought content normal.        Judgment: Judgment normal.          Patient has been counseled extensively about nutrition and exercise as well as the importance of adherence with medications and regular follow-up. The patient was given clear instructions to go to ER or return to medical center if symptoms don't improve, worsen or new problems develop. The patient verbalized understanding.   Follow-up: Return in about 3 months (around 01/29/2021).   Gildardo Pounds, FNP-BC [facility] [place], [place]   10/31/2020, 9:16 PM

## 2020-11-01 LAB — CMP14+EGFR
ALT: 15 IU/L (ref 0–32)
AST: 13 IU/L (ref 0–40)
Albumin/Globulin Ratio: 1.6 (ref 1.2–2.2)
Albumin: 4.3 g/dL (ref 3.8–4.9)
Alkaline Phosphatase: 79 IU/L (ref 44–121)
BUN/Creatinine Ratio: 19 (ref 9–23)
BUN: 12 mg/dL (ref 6–24)
Bilirubin Total: <0.2 mg/dL (ref 0.0–1.2)
CO2: 23 mmol/L (ref 20–29)
Calcium: 9.6 mg/dL (ref 8.7–10.2)
Chloride: 105 mmol/L (ref 96–106)
Creatinine, Ser: 0.63 mg/dL (ref 0.57–1.00)
GFR calc Af Amer: 118 mL/min/{1.73_m2} (ref >59)
GFR calc non Af Amer: 102 mL/min/{1.73_m2} (ref >59)
Globulin, Total: 2.7 g/dL (ref 1.5–4.5)
Glucose: 94 mg/dL (ref 65–99)
Potassium: 4 mmol/L (ref 3.5–5.2)
Sodium: 141 mmol/L (ref 134–144)
Total Protein: 7 g/dL (ref 6.0–8.5)

## 2020-11-01 LAB — CBC
Hematocrit: 39.4 % (ref 34.0–46.6)
Hemoglobin: 12.8 g/dL (ref 11.1–15.9)
MCH: 29 pg (ref 26.6–33.0)
MCHC: 32.5 g/dL (ref 31.5–35.7)
MCV: 89 fL (ref 79–97)
Platelets: 375 10*3/uL (ref 150–450)
RBC: 4.42 x10E6/uL (ref 3.77–5.28)
RDW: 13.5 % (ref 11.7–15.4)
WBC: 8.4 10*3/uL (ref 3.4–10.8)

## 2020-11-01 LAB — HIV ANTIBODY (ROUTINE TESTING W REFLEX): HIV Screen 4th Generation wRfx: NONREACTIVE

## 2020-11-01 LAB — TSH: TSH: 1.23 u[IU]/mL (ref 0.450–4.500)

## 2020-11-01 LAB — HCV INTERPRETATION

## 2020-11-01 LAB — HCV AB W REFLEX TO QUANT PCR: HCV Ab: <0.1 s/co ratio (ref 0.0–0.9)

## 2020-11-01 LAB — LIPID PANEL
Chol/HDL Ratio: 3.1 ratio (ref 0.0–4.4)
Cholesterol, Total: 177 mg/dL (ref 100–199)
HDL: 57 mg/dL (ref >39)
LDL Chol Calc (NIH): 106 mg/dL — ABNORMAL HIGH (ref 0–99)
Triglycerides: 76 mg/dL (ref 0–149)
VLDL Cholesterol Cal: 14 mg/dL (ref 5–40)

## 2020-11-02 ENCOUNTER — Other Ambulatory Visit: Payer: Self-pay | Admitting: Nurse Practitioner

## 2020-11-02 DIAGNOSIS — G8929 Other chronic pain: Secondary | ICD-10-CM

## 2020-11-02 MED FILL — ?ATORVASTATIN 10 MG TABLET: 10 | 30 days supply | Qty: 30 | Fill #1

## 2020-11-09 MED FILL — VENLAFAXINE HCL ER 150 MG C: 150 | 30 days supply | Qty: 30 | Fill #2

## 2020-11-14 ENCOUNTER — Other Ambulatory Visit: Payer: Self-pay | Admitting: Nurse Practitioner

## 2020-11-14 ENCOUNTER — Encounter: Payer: Self-pay | Admitting: Nurse Practitioner

## 2020-11-14 DIAGNOSIS — M79605 Pain in left leg: Secondary | ICD-10-CM

## 2020-11-14 DIAGNOSIS — F17200 Nicotine dependence, unspecified, uncomplicated: Secondary | ICD-10-CM

## 2020-11-14 NOTE — Telephone Encounter (Signed)
Please call to schedule ABI

## 2020-11-16 MED FILL — LISINOPRIL 10 MG TABS: 10 | 30 days supply | Qty: 30 | Fill #2

## 2020-11-16 NOTE — Telephone Encounter (Signed)
Spoke to patient and scheduled a nurse visit for ABI.

## 2020-11-17 ENCOUNTER — Ambulatory Visit: Payer: Medicaid Other | Attending: Nurse Practitioner

## 2020-11-17 ENCOUNTER — Other Ambulatory Visit: Payer: Self-pay

## 2020-11-17 DIAGNOSIS — M79605 Pain in left leg: Secondary | ICD-10-CM

## 2020-11-17 LAB — POCT ABI - SCREENING FOR PILOT NO CHARGE
Immediate ABI left: 1.19
Immediate ABI right: 1.12

## 2020-11-17 NOTE — Progress Notes (Signed)
Patient came in for ABI screening test. Patient tolerated well and stated the left leg pain has been going on for a while.   Reported result to PCP.

## 2020-11-27 MED FILL — OMEPRAZOLE DR 40 MG CAPSULE: 40 | 30 days supply | Qty: 30 | Fill #1

## 2020-12-01 MED FILL — PREGABALIN 75 MG CAPS: 75 | 30 days supply | Qty: 60 | Fill #1

## 2020-12-05 MED FILL — ?ATORVASTATIN 10 MG TABLET: 10 | 30 days supply | Qty: 30 | Fill #2

## 2020-12-11 ENCOUNTER — Other Ambulatory Visit: Payer: Self-pay | Admitting: Nurse Practitioner

## 2020-12-11 DIAGNOSIS — F339 Major depressive disorder, recurrent, unspecified: Secondary | ICD-10-CM

## 2020-12-11 MED FILL — VENLAFAXINE HCL ER 150 MG C: 150 | 30 days supply | Qty: 30 | Fill #0

## 2020-12-19 ENCOUNTER — Other Ambulatory Visit: Payer: Self-pay | Admitting: Nurse Practitioner

## 2020-12-19 DIAGNOSIS — I10 Essential (primary) hypertension: Secondary | ICD-10-CM

## 2020-12-19 MED ORDER — LISINOPRIL 10 MG PO TABS: 10.0000 mg | ORAL_TABLET | Freq: Every day | ORAL | 0 refills | Status: DC

## 2020-12-19 MED FILL — LISINOPRIL 10 MG TABS: 10 | 30 days supply | Qty: 30 | Fill #0

## 2020-12-25 ENCOUNTER — Other Ambulatory Visit (HOSPITAL_COMMUNITY): Payer: Self-pay | Admitting: Emergency Medicine

## 2020-12-25 ENCOUNTER — Emergency Department (HOSPITAL_COMMUNITY)
Admission: EM | Admit: 2020-12-25 | Discharge: 2020-12-25 | Disposition: A | Discharge status: Home / Self Care | Payer: Medicaid Other | Attending: Emergency Medicine | Admitting: Emergency Medicine

## 2020-12-25 ENCOUNTER — Other Ambulatory Visit: Payer: Self-pay

## 2020-12-25 ENCOUNTER — Ambulatory Visit (HOSPITAL_COMMUNITY)
Admission: RE | Admit: 2020-12-25 | Discharge: 2020-12-25 | Disposition: A | Discharge status: Home / Self Care | Payer: Self-pay | Source: Ambulatory Visit | Attending: Emergency Medicine | Admitting: Emergency Medicine

## 2020-12-25 ENCOUNTER — Encounter (HOSPITAL_COMMUNITY): Payer: Self-pay | Admitting: Emergency Medicine

## 2020-12-25 DIAGNOSIS — F17210 Nicotine dependence, cigarettes, uncomplicated: Secondary | ICD-10-CM | POA: Insufficient documentation

## 2020-12-25 DIAGNOSIS — M79605 Pain in left leg: Secondary | ICD-10-CM | POA: Insufficient documentation

## 2020-12-25 DIAGNOSIS — I10 Essential (primary) hypertension: Secondary | ICD-10-CM | POA: Insufficient documentation

## 2020-12-25 DIAGNOSIS — Z7982 Long term (current) use of aspirin: Secondary | ICD-10-CM | POA: Insufficient documentation

## 2020-12-25 DIAGNOSIS — M5432 Sciatica, left side: Secondary | ICD-10-CM | POA: Insufficient documentation

## 2020-12-25 DIAGNOSIS — Z79899 Other long term (current) drug therapy: Secondary | ICD-10-CM | POA: Insufficient documentation

## 2020-12-25 MED ORDER — PREDNISONE 20 MG PO TABS: ORAL_TABLET | ORAL | 0 refills | Status: DC

## 2020-12-25 MED ORDER — GABAPENTIN 100 MG PO CAPS: 100.0000 mg | ORAL_CAPSULE | Freq: Three times a day (TID) | ORAL | 0 refills | Status: DC

## 2020-12-25 MED ORDER — OXYCODONE-ACETAMINOPHEN 5-325 MG PO TABS
2.0000 | ORAL_TABLET | Freq: Once | ORAL | Status: AC
  Administered 2020-12-25: 2 via ORAL
  Filled 2020-12-25: qty 2

## 2020-12-25 MED ORDER — METHYLPREDNISOLONE SODIUM SUCC 125 MG IJ SOLR
125.0000 mg | Freq: Once | INTRAMUSCULAR | Status: AC
  Administered 2020-12-25: 125 mg via INTRAMUSCULAR
  Filled 2020-12-25: qty 2

## 2020-12-25 MED ORDER — GABAPENTIN 100 MG PO CAPS
100.0000 mg | ORAL_CAPSULE | Freq: Once | ORAL | Status: AC
  Administered 2020-12-25: 100 mg via ORAL
  Filled 2020-12-25: qty 1

## 2020-12-25 MED ORDER — KETOROLAC TROMETHAMINE 60 MG/2ML IM SOLN
30.0000 mg | Freq: Once | INTRAMUSCULAR | Status: AC
  Administered 2020-12-25: 30 mg via INTRAMUSCULAR
  Filled 2020-12-25: qty 2

## 2020-12-25 MED FILL — GABAPENTIN 100 MG CAPSULE: 100 | 33 days supply | Qty: 100 | Fill #0

## 2020-12-25 MED FILL — predniSONE 20 MG TABS: 20 | 15 days supply | Qty: 27 | Fill #0

## 2020-12-25 MED FILL — OMEPRAZOLE DR 40 MG CAPSULE: 40 | 30 days supply | Qty: 30 | Fill #2

## 2020-12-25 NOTE — ED Triage Notes (Signed)
Patient reports sciatica pain flare up  at left leg onset last week , denies injury , pain increases with movement/weight bearing .

## 2020-12-25 NOTE — ED Notes (Signed)
Patient verbalizes understanding of discharge instructions. Prescriptions and follow-up care reviewed. Opportunity for questioning and answers were provided. Armband removed by staff, pt discharged from ED via wheelchair to the lobby to wait on ride.

## 2020-12-25 NOTE — ED Provider Notes (Signed)
[facility] Provider Note   CSN: [id number] Arrival date & time: 12/25/20  0510     History Chief Complaint  Patient presents with  . Sciatica    Lauren [name] is a 55 y.o. female.   Leg Pain Location:  Leg Leg location:  L leg Pain details:    Quality:  Aching, dull and sharp   Severity:  Mild   Onset quality:  Gradual   Timing:  Constant   Progression:  Worsening Chronicity:  New Relieved by:  Nothing Worsened by:  Nothing Ineffective treatments:  None tried Associated symptoms: no back pain, no fever, no itching and no neck pain        Past Medical History:  Diagnosis Date  . Anxiety   . Arthritis   . Depression   . GERD (gastroesophageal reflux disease)   . Hypertension   . Prediabetes   . Sleep apnea    Past hx - no cpap now     Patient Active Problem List   Diagnosis Date Noted  . Radiculopathy of cervical spine 08/25/2018  . Pain in right hand 07/28/2018  . Trigger finger, right ring finger 07/28/2018  . Mixed hyperlipidemia 06/19/2017  . Gastroesophageal reflux disease 04/15/2017  . OSA (obstructive sleep apnea) 04/15/2017  . Depression 11/28/2016    Past Surgical History:  Procedure Laterality Date  . ABDOMINAL HYSTERECTOMY    . BACK SURGERY    . CESAREAN SECTION    . COLONOSCOPY    . HAND SURGERY    . NECK SURGERY  2002   plate in her neck     OB History    Gravida  1   Para      Term      Preterm      AB      Living  1     SAB      IAB      Ectopic      Multiple      Live Births  1           Family History  Problem Relation Age of Onset  . Diabetes Father   . Healthy Mother   . Colon cancer Neg Hx   . Colon polyps Neg Hx   . Esophageal cancer Neg Hx   . Rectal cancer Neg Hx   . Stomach cancer Neg Hx     Social History   Tobacco Use  . Smoking status: Current Every Day Smoker    Packs/day: 0.50    Types: Cigarettes  . Smokeless tobacco: Never Used  .  Tobacco comment: 0.5 or less   Vaping Use  . Vaping Use: Never used  Substance Use Topics  . Alcohol use: No  . Drug use: No    Home Medications Prior to Admission medications   Medication Sig Start Date End Date Taking? Authorizing Provider  gabapentin (NEURONTIN) 100 MG capsule Take 1 capsule (100 mg total) by mouth 3 (three) times daily. 12/25/20  Yes Halsey Persaud, Corene Cornea, MD  predniSONE (DELTASONE) 20 MG tablet 3 tabs po daily x 3 days, then 2 tabs x 3 days, then 1.5 tabs x 3 days, then 1 tab x 3 days, then 0.5 tabs x 3 days 12/25/20  Yes [place] Hudman, Corene Cornea, MD  albuterol (VENTOLIN HFA) 108 (90 Base) MCG/ACT inhaler Inhale 1-2 puffs into the lungs every 6 (six) hours as needed for wheezing or shortness of breath. Patient not taking: No sig reported 02/19/19  Wieters, Hallie C, PA-C  aspirin (ASPIRIN 81) 81 MG EC tablet Take 1 tablet (81 mg total) by mouth daily. Patient not taking: Reported on 10/31/2020 09/28/20   Charlott Rakes, MD  atorvastatin (LIPITOR) 10 MG tablet Take 1 tablet (10 mg total) by mouth daily. 09/28/20   Charlott Rakes, MD  DULoxetine (CYMBALTA) 30 MG capsule Take 1 capsule (30 mg total) by mouth daily. 09/22/20   Gildardo Pounds, NP  lisinopril (ZESTRIL) 10 MG tablet Take 1 tablet (10 mg total) by mouth daily. 12/19/20   Gildardo Pounds, NP  metoprolol tartrate (LOPRESSOR) 25 MG tablet Take 0.5 tablets (12.5 mg total) by mouth 2 (two) times daily. Patient taking differently: Take 12.5 mg by mouth 2 (two) times daily. PRN 09/04/20 12/03/20  Gildardo Pounds, NP  Misc. Devices MISC Please provide patient with insurance approved supplies for her CPAP: tubing, chin strap and supplies and filters. 11/17/18   Gildardo Pounds, NP  Multiple Vitamin (MULTIVITAMIN) capsule Take by mouth.    [provider]  omeprazole (PRILOSEC) 40 MG capsule Take 1 capsule (40 mg total) by mouth daily. 10/31/20 01/29/21  Gildardo Pounds, NP  pregabalin (LYRICA) 75 MG capsule Take 1 capsule (75 mg total)  by mouth 2 (two) times daily. 10/31/20   Gildardo Pounds, NP  traZODone (DESYREL) 50 MG tablet Take 50 mg by mouth at bedtime.    [provider]  venlafaxine XR (EFFEXOR-XR) 150 MG 24 hr capsule TAKE 1 CAPSULE BY MOUTH IN THE MORNING 12/11/20   Gildardo Pounds, NP  amitriptyline (ELAVIL) 75 MG tablet Take 1 tablet (75 mg total) by mouth at bedtime. 10/06/18 07/20/20  Gildardo Pounds, NP  Cetirizine HCl 10 MG CAPS Take 1 capsule (10 mg total) by mouth daily for 10 days. 02/19/19 07/20/20  Wieters, Hallie C, PA-C  fluticasone (FLONASE) 50 MCG/ACT nasal spray Place 2 sprays into both nostrils daily for 14 days. 02/10/19 07/20/20  Gildardo Pounds, NP  sucralfate (CARAFATE) 1 g tablet Take 1 tablet (1 g total) by mouth 3 (three) times daily with meals for 14 days. 03/15/19 07/20/20  Couture, Cortni S, PA-C    Allergies    Iodinated diagnostic agents  Review of Systems   Review of Systems  Constitutional: Negative for fever.  Musculoskeletal: Negative for back pain and neck pain.  Skin: Negative for itching.  All other systems reviewed and are negative.   Physical Exam Updated Vital Signs BP (!) 155/85   Pulse (!) 110   Temp 98.1 F (36.7 C)   Resp 16   SpO2 96%   Physical Exam Vitals and nursing note reviewed.  Constitutional:      Appearance: She is well-developed and well-nourished.  HENT:     Head: Normocephalic and atraumatic.     Mouth/Throat:     Mouth: Mucous membranes are moist.     Pharynx: Oropharynx is clear.  Eyes:     Pupils: Pupils are equal, round, and reactive to light.  Cardiovascular:     Rate and Rhythm: Normal rate and regular rhythm.  Pulmonary:     Effort: No respiratory distress.     Breath sounds: No stridor.  Abdominal:     General: Abdomen is flat. There is no distension.  Musculoskeletal:     Cervical back: Normal range of motion.  Skin:    General: Skin is warm and dry.  Neurological:     General: No focal deficit present.  Mental  Status: She is alert.     ED Results / Procedures / Treatments   Labs (all labs ordered are listed, but only abnormal results are displayed) Labs Reviewed - No data to display  EKG None  Radiology No results found.  Procedures Procedures   Medications Ordered in ED Medications  gabapentin (NEURONTIN) capsule 100 mg (100 mg Oral Given 12/25/20 0537)  ketorolac (TORADOL) injection 30 mg (30 mg Intramuscular Given 12/25/20 0538)  methylPREDNISolone sodium succinate (SOLU-MEDROL) 125 mg/2 mL injection 125 mg (125 mg Intramuscular Given 12/25/20 0538)  oxyCODONE-acetaminophen (PERCOCET/ROXICET) 5-325 MG per tablet 2 tablet (2 tablets Oral Given 12/25/20 0537)    ED Course  I have reviewed the triage vital signs and the nursing notes.  Pertinent labs & imaging results that were available during my care of the patient were reviewed by me and considered in my medical decision making (see chart for details).    MDM Rules/Calculators/A&P                          Seems c/w sciatica pain. Treated here for same. Will increase neurontin at home. pcp follow up. No red flags on exam/history.   Final Clinical Impression(s) / ED Diagnoses Final diagnoses:  Sciatica of left side    Rx / DC Orders ED Discharge Orders         Ordered    LE VENOUS        12/25/20 0531    gabapentin (NEURONTIN) 100 MG capsule  3 times daily        12/25/20 0535    predniSONE (DELTASONE) 20 MG tablet        12/25/20 0535           Christina Waldrop, Corene Cornea, MD 12/25/20 2259

## 2020-12-25 NOTE — Progress Notes (Signed)
Lower extremity venous has been completed.   Preliminary results in CV Proc.   Abram Sander 12/25/2020 11:22 AM

## 2020-12-26 ENCOUNTER — Telehealth: Payer: Self-pay | Admitting: *Deleted

## 2020-12-26 NOTE — Telephone Encounter (Signed)
Transition Care Management Unsuccessful Follow-up Telephone Call  Date of discharge and from where:  12/25/2020 [facility]  Attempts:  1st Attempt  Reason for unsuccessful TCM follow-up call:  Left voice message

## 2020-12-27 NOTE — Telephone Encounter (Signed)
Transition Care Management Follow-up Telephone Call  Date of discharge and from where: 12/25/2020 - [facility]  How have you been since you were released from the hospital? "A little better"  Any questions or concerns? No  Items Reviewed:  Did the pt receive and understand the discharge instructions provided? Yes   Medications obtained and verified? Yes   Other? No   Any new allergies since your discharge? No   Dietary orders reviewed? Yes  Do you have support at home? Yes   Home Care and Equipment/Supplies: Were home health services ordered? not applicable If so, what is the name of the agency? N/A  Has the agency set up a time to come to the patient's home? not applicable Were any new equipment or medical supplies ordered?  No What is the name of the medical supply agency? N/A Were you able to get the supplies/equipment? not applicable Do you have any questions related to the use of the equipment or supplies? No  Functional Questionnaire: (I = Independent and D = Dependent) ADLs: I  Bathing/Dressing- I  Meal Prep- I  Eating- I  Maintaining continence- I  Transferring/Ambulation- I  Managing Meds- I  Follow up appointments reviewed:   PCP Hospital f/u appt confirmed? Yes  Scheduled to see Dr. Raul Del on 01/03/2021 @ 1050.  [facility] f/u appt confirmed? No    Are transportation arrangements needed? No   If their condition worsens, is the pt aware to call PCP or go to the Emergency Dept.? Yes  Was the patient provided with contact information for the PCP's office or ED? Yes  Was to pt encouraged to call back with questions or concerns? Yes

## 2021-01-03 ENCOUNTER — Ambulatory Visit: Payer: Self-pay | Attending: Nurse Practitioner | Admitting: Nurse Practitioner

## 2021-01-03 ENCOUNTER — Other Ambulatory Visit: Payer: Self-pay

## 2021-01-03 ENCOUNTER — Encounter: Payer: Self-pay | Admitting: Nurse Practitioner

## 2021-01-03 ENCOUNTER — Other Ambulatory Visit: Payer: Self-pay | Admitting: Nurse Practitioner

## 2021-01-03 ENCOUNTER — Other Ambulatory Visit: Payer: Self-pay | Admitting: Family Medicine

## 2021-01-03 VITALS — BP 143/81 | HR 79 | Wt 173.2 lb

## 2021-01-03 DIAGNOSIS — E785 Hyperlipidemia, unspecified: Secondary | ICD-10-CM

## 2021-01-03 DIAGNOSIS — M5442 Lumbago with sciatica, left side: Secondary | ICD-10-CM

## 2021-01-03 DIAGNOSIS — F17200 Nicotine dependence, unspecified, uncomplicated: Secondary | ICD-10-CM

## 2021-01-03 DIAGNOSIS — I10 Essential (primary) hypertension: Secondary | ICD-10-CM

## 2021-01-03 DIAGNOSIS — Z1231 Encounter for screening mammogram for malignant neoplasm of breast: Secondary | ICD-10-CM

## 2021-01-03 DIAGNOSIS — G8929 Other chronic pain: Secondary | ICD-10-CM

## 2021-01-03 MED ORDER — LISINOPRIL 20 MG PO TABS: 20.0000 mg | ORAL_TABLET | Freq: Every day | ORAL | 0 refills | Status: DC

## 2021-01-03 MED ORDER — ATORVASTATIN CALCIUM 10 MG PO TABS: 10.0000 mg | ORAL_TABLET | Freq: Every day | ORAL | 1 refills | Status: DC

## 2021-01-03 MED ORDER — GABAPENTIN 300 MG PO CAPS: 300.0000 mg | ORAL_CAPSULE | Freq: Three times a day (TID) | ORAL | 1 refills | Status: DC

## 2021-01-03 MED ORDER — ALBUTEROL SULFATE HFA 108 (90 BASE) MCG/ACT IN AERS: 1.0000 | INHALATION_SPRAY | Freq: Four times a day (QID) | RESPIRATORY_TRACT | 0 refills | Status: DC | PRN

## 2021-01-03 MED ORDER — ASPIRIN 81 MG PO TBEC: 81.0000 mg | DELAYED_RELEASE_TABLET | Freq: Every day | ORAL | 3 refills | Status: DC

## 2021-01-03 MED FILL — ?ATORVASTATIN 10 MG TABLET: 10 | 30 days supply | Qty: 30 | Fill #0

## 2021-01-03 NOTE — Progress Notes (Signed)
Assessment & Plan:  Lauren [name] was seen today for follow-up.  Diagnoses and all orders for this visit:  Essential hypertension -     aspirin (ASPIRIN 81) 81 MG EC tablet; Take 1 tablet (81 mg total) by mouth daily. -     lisinopril (ZESTRIL) 20 MG tablet; Take 1 tablet (20 mg total) by mouth daily. Continue all antihypertensives as prescribed.  Remember to bring in your blood pressure log with you for your follow up appointment.  DASH/Mediterranean Diets are healthier choices for HTN.   Chronic left-sided low back pain with left-sided sciatica -     gabapentin (NEURONTIN) 300 MG capsule; Take 1 capsule (300 mg total) by mouth 3 (three) times daily. She will start Lyrica in 2 weeks if increase in gabapentin ineffective  Tobacco dependence -     albuterol (VENTOLIN HFA) 108 (90 Base) MCG/ACT inhaler; Inhale 1-2 puffs into the lungs every 6 (six) hours as needed for wheezing or shortness of breath.  Dyslipidemia, goal LDL below 100 -     atorvastatin (LIPITOR) 10 MG tablet; Take 1 tablet (10 mg total) by mouth daily. INSTRUCTIONS: Work on a low fat, heart healthy diet and participate in regular aerobic exercise program by working out at least 150 minutes per week; 5 days a week-30 minutes per day. Avoid red meat/beef/steak,  fried foods. junk foods, sodas, sugary drinks, unhealthy snacking, alcohol and smoking.  Drink at least 80 oz of water per day and monitor your carbohydrate intake daily.    Breast cancer screening by mammogram -     MM 3D SCREEN BREAST BILATERAL; Future    Patient has been counseled on age-appropriate routine health concerns for screening and prevention. These are reviewed and up-to-date. Referrals have been placed accordingly. Immunizations are up-to-date or declined.    Subjective:   Chief Complaint  Patient presents with  . Follow-up   HPI Lauren [name] 55 y.o. female presents to office today for follow up. She has a past medical history of Anxiety,  Arthritis, Depression, GERD Lumbar radiculopathy, Hypertension, HPL, Prediabetes, and Sleep apnea.  She has been prescribed atorvastatin 10 mg daily. Denies any statin intolerance.  The 10-year ASCVD risk score Mikey Bussing DC Brooke Bonito., et al., 2013) is: 11.4%   Values used to calculate the score:     Age: 9 years     Sex: Female     Is Non-Hispanic African American: Yes     Diabetic: No     Tobacco smoker: Yes     Systolic Blood Pressure: 941 mmHg     Is BP treated: Yes     HDL Cholesterol: 57 mg/dL     Total Cholesterol: 177 mg/dL    Blood pressure is elevated today. She does smoke. I am increasing her lisinopril from 10 mg to 20mg  daily. Denies chest pain, shortness of breath, palpitations, lightheadedness, dizziness, headaches or BLE edema.  BP Readings from Last 3 Encounters:  01/03/21 (!) 143/81  12/25/20 (!) 175/93  10/31/20 138/79   Chronic Low back pain She has chronic low back pain with left sided sciatica.  She is seeing orthopedic surgery for this in the past and was given a cortisone injection which did temporarily relieve her pain.  She has a history of microdiscectomy in 2014.  Treatments tried in the past include gabapentin as high as 400 mg 3 times daily, methocarbamol, Toradol injection, Flexeril, meloxicam.  She has been prescribed Cymbalta and Lyrica in the past however she was afraid to  take them along with her gabapentin.  There is no involuntary loss of urine or stool.  Symptoms are worse with walking and sitting for prolonged periods of time and pain is described as sharp and starting in the left hip and radiating down into the lower leg and left calf.       Review of Systems  Constitutional: Negative for fever, malaise/fatigue and weight loss.  HENT: Negative.  Negative for nosebleeds.   Eyes: Negative.  Negative for blurred vision, double vision and photophobia.  Respiratory: Negative.  Negative for cough and shortness of breath.   Cardiovascular: Negative.  Negative  for chest pain, palpitations and leg swelling.  Gastrointestinal: Negative.  Negative for heartburn, nausea and vomiting.  Musculoskeletal: Positive for back pain. Negative for myalgias.  Neurological: Positive for sensory change. Negative for dizziness, focal weakness, seizures and headaches.  Psychiatric/Behavioral: Negative.  Negative for suicidal ideas.    Past Medical History:  Diagnosis Date  . Anxiety   . Arthritis   . Depression   . GERD (gastroesophageal reflux disease)   . Hypertension   . Prediabetes   . Sleep apnea    Past hx - no cpap now     Past Surgical History:  Procedure Laterality Date  . ABDOMINAL HYSTERECTOMY    . BACK SURGERY    . CESAREAN SECTION    . COLONOSCOPY    . HAND SURGERY    . NECK SURGERY  2002   plate in her neck    Family History  Problem Relation Age of Onset  . Diabetes Father   . Healthy Mother   . Colon cancer Neg Hx   . Colon polyps Neg Hx   . Esophageal cancer Neg Hx   . Rectal cancer Neg Hx   . Stomach cancer Neg Hx     Social History Reviewed with no changes to be made today.   Outpatient Medications Prior to Visit  Medication Sig Dispense Refill  . metoprolol tartrate (LOPRESSOR) 25 MG tablet Take 0.5 tablets (12.5 mg total) by mouth 2 (two) times daily. (Patient taking differently: Take 12.5 mg by mouth 2 (two) times daily. PRN) 90 tablet 0  . Misc. Devices MISC Please provide patient with insurance approved supplies for her CPAP: tubing, chin strap and supplies and filters. 1 each 0  . Multiple Vitamin (MULTIVITAMIN) capsule Take by mouth.    Marland Kitchen omeprazole (PRILOSEC) 40 MG capsule Take 1 capsule (40 mg total) by mouth daily. 90 capsule 1  . predniSONE (DELTASONE) 20 MG tablet 3 tabs po daily x 3 days, then 2 tabs x 3 days, then 1.5 tabs x 3 days, then 1 tab x 3 days, then 0.5 tabs x 3 days 27 tablet 0  . pregabalin (LYRICA) 75 MG capsule Take 1 capsule (75 mg total) by mouth 2 (two) times daily. 60 capsule 1  . traZODone  (DESYREL) 50 MG tablet Take 50 mg by mouth at bedtime.    Marland Kitchen venlafaxine XR (EFFEXOR-XR) 150 MG 24 hr capsule TAKE 1 CAPSULE BY MOUTH IN THE MORNING 30 capsule 0  . albuterol (VENTOLIN HFA) 108 (90 Base) MCG/ACT inhaler Inhale 1-2 puffs into the lungs every 6 (six) hours as needed for wheezing or shortness of breath. (Patient not taking: No sig reported) 1 Inhaler 0  . aspirin (ASPIRIN 81) 81 MG EC tablet Take 1 tablet (81 mg total) by mouth daily. (Patient not taking: Reported on 10/31/2020) 30 tablet 12  . atorvastatin (LIPITOR) 10 MG tablet  Take 1 tablet (10 mg total) by mouth daily. 30 tablet 2  . DULoxetine (CYMBALTA) 30 MG capsule Take 1 capsule (30 mg total) by mouth daily. 30 capsule 3  . gabapentin (NEURONTIN) 100 MG capsule Take 1 capsule (100 mg total) by mouth 3 (three) times daily. 100 capsule 0  . lisinopril (ZESTRIL) 10 MG tablet Take 1 tablet (10 mg total) by mouth daily. 90 tablet 0   No facility-administered medications prior to visit.    Allergies  Allergen Reactions  . Iodinated Diagnostic Agents Nausea Only       Objective:    BP (!) 143/81   Pulse 79   Wt 173 lb 3.2 oz (78.6 kg)   SpO2 97%   BMI 29.90 kg/m  Wt Readings from Last 3 Encounters:  01/03/21 173 lb 3.2 oz (78.6 kg)  10/31/20 169 lb (76.7 kg)  08/04/20 160 lb 0.9 oz (72.6 kg)    Physical Exam Vitals and nursing note reviewed.  Constitutional:      Appearance: She is well-developed.  HENT:     Head: Normocephalic and atraumatic.  Cardiovascular:     Rate and Rhythm: Normal rate and regular rhythm.     Heart sounds: Normal heart sounds. No murmur heard. No friction rub. No gallop.   Pulmonary:     Effort: Pulmonary effort is normal. No tachypnea or respiratory distress.     Breath sounds: Normal breath sounds. No decreased breath sounds, wheezing, rhonchi or rales.  Chest:     Chest wall: No tenderness.  Abdominal:     General: Bowel sounds are normal.     Palpations: Abdomen is soft.   Musculoskeletal:        General: Normal range of motion.     Cervical back: Normal range of motion.  Skin:    General: Skin is warm and dry.  Neurological:     Mental Status: She is alert and oriented to person, place, and time.     Coordination: Coordination normal.  Psychiatric:        Behavior: Behavior normal. Behavior is cooperative.        Thought Content: Thought content normal.        Judgment: Judgment normal.          Patient has been counseled extensively about nutrition and exercise as well as the importance of adherence with medications and regular follow-up. The patient was given clear instructions to go to ER or return to medical center if symptoms don't improve, worsen or new problems develop. The patient verbalized understanding.   Follow-up: Return in about 3 months (around 04/05/2021).   Gildardo Pounds, FNP-BC [facility] [place], [place]   01/03/2021, 12:30 PM

## 2021-01-03 NOTE — Patient Instructions (Signed)
[place]

## 2021-01-09 ENCOUNTER — Other Ambulatory Visit: Payer: Self-pay | Admitting: Nurse Practitioner

## 2021-01-09 DIAGNOSIS — I10 Essential (primary) hypertension: Secondary | ICD-10-CM

## 2021-01-09 DIAGNOSIS — F339 Major depressive disorder, recurrent, unspecified: Secondary | ICD-10-CM

## 2021-01-09 NOTE — Telephone Encounter (Signed)
Will forward to provider  

## 2021-01-10 MED ORDER — VENLAFAXINE HCL ER 150 MG PO CP24: 150.0000 mg | ORAL_CAPSULE | Freq: Every morning | ORAL | 0 refills | Status: DC

## 2021-01-10 MED ORDER — METOPROLOL TARTRATE 25 MG PO TABS
12.5000 mg | ORAL_TABLET | Freq: Two times a day (BID) | ORAL | 2 refills | Status: DC
  Filled 2021-02-09: qty 30, 30d supply, fill #0

## 2021-01-11 MED FILL — ?METOPROLOL TARTRATE 25MG T: 25 | 30 days supply | Qty: 30 | Fill #0

## 2021-01-11 MED FILL — ?VENLAFAXINE HCL ER 150MG: 150 | 30 days supply | Qty: 30 | Fill #0

## 2021-01-22 ENCOUNTER — Encounter: Payer: Self-pay | Admitting: Nurse Practitioner

## 2021-01-25 ENCOUNTER — Other Ambulatory Visit: Payer: Self-pay | Admitting: Nurse Practitioner

## 2021-01-25 DIAGNOSIS — M79605 Pain in left leg: Secondary | ICD-10-CM

## 2021-01-25 MED FILL — Omeprazole Cap Delayed Release 40 MG: ORAL | 30 days supply | Qty: 30 | Fill #0 | Status: AC

## 2021-01-26 ENCOUNTER — Other Ambulatory Visit: Payer: Self-pay

## 2021-01-29 ENCOUNTER — Ambulatory Visit: Payer: Medicaid Other | Admitting: Nurse Practitioner

## 2021-01-31 ENCOUNTER — Encounter: Payer: Self-pay | Admitting: Physical Medicine & Rehabilitation

## 2021-01-31 ENCOUNTER — Other Ambulatory Visit: Payer: Self-pay

## 2021-02-05 ENCOUNTER — Encounter: Payer: Self-pay | Admitting: Nurse Practitioner

## 2021-02-06 ENCOUNTER — Other Ambulatory Visit: Payer: Self-pay | Admitting: Nurse Practitioner

## 2021-02-06 ENCOUNTER — Other Ambulatory Visit: Payer: Self-pay

## 2021-02-06 DIAGNOSIS — Z1231 Encounter for screening mammogram for malignant neoplasm of breast: Secondary | ICD-10-CM

## 2021-02-06 DIAGNOSIS — F339 Major depressive disorder, recurrent, unspecified: Secondary | ICD-10-CM

## 2021-02-06 MED FILL — Atorvastatin Calcium Tab 10 MG (Base Equivalent): ORAL | 30 days supply | Qty: 30 | Fill #0 | Status: AC

## 2021-02-06 MED FILL — Gabapentin Cap 300 MG: ORAL | 30 days supply | Qty: 90 | Fill #0 | Status: AC

## 2021-02-06 MED FILL — Lisinopril Tab 20 MG: ORAL | 30 days supply | Qty: 30 | Fill #0 | Status: AC

## 2021-02-07 ENCOUNTER — Other Ambulatory Visit: Payer: Self-pay

## 2021-02-08 ENCOUNTER — Other Ambulatory Visit: Payer: Self-pay | Admitting: Nurse Practitioner

## 2021-02-08 ENCOUNTER — Encounter: Payer: Self-pay | Admitting: Nurse Practitioner

## 2021-02-08 DIAGNOSIS — G8929 Other chronic pain: Secondary | ICD-10-CM

## 2021-02-08 MED ORDER — VENLAFAXINE HCL ER 150 MG PO CP24
150.0000 mg | ORAL_CAPSULE | Freq: Every morning | ORAL | 0 refills | Status: DC
  Filled 2021-02-08: qty 30, 30d supply, fill #0

## 2021-02-08 MED ORDER — GABAPENTIN 300 MG PO CAPS
ORAL_CAPSULE | Freq: Three times a day (TID) | ORAL | 1 refills | Status: DC
  Filled 2021-02-08: qty 90, fill #0
  Filled 2021-02-28: qty 90, 30d supply, fill #0

## 2021-02-08 MED ORDER — IBUPROFEN 600 MG PO TABS
600.0000 mg | ORAL_TABLET | Freq: Three times a day (TID) | ORAL | 1 refills | Status: DC | PRN
  Filled 2021-02-08: qty 60, 20d supply, fill #0
  Filled 2021-02-28: qty 60, 20d supply, fill #1

## 2021-02-08 NOTE — Telephone Encounter (Signed)
Will forward to provider  

## 2021-02-09 ENCOUNTER — Other Ambulatory Visit: Payer: Self-pay

## 2021-02-09 ENCOUNTER — Other Ambulatory Visit: Payer: Self-pay | Admitting: Nurse Practitioner

## 2021-02-09 ENCOUNTER — Ambulatory Visit
Admission: RE | Admit: 2021-02-09 | Discharge: 2021-02-09 | Disposition: A | Discharge status: Home / Self Care | Payer: Medicaid Other | Source: Ambulatory Visit

## 2021-02-09 DIAGNOSIS — Z1231 Encounter for screening mammogram for malignant neoplasm of breast: Secondary | ICD-10-CM

## 2021-02-09 DIAGNOSIS — I10 Essential (primary) hypertension: Secondary | ICD-10-CM

## 2021-02-09 NOTE — Telephone Encounter (Signed)
Please refill if appropriate

## 2021-02-11 ENCOUNTER — Emergency Department (HOSPITAL_COMMUNITY): Payer: Self-pay

## 2021-02-11 ENCOUNTER — Ambulatory Visit (HOSPITAL_COMMUNITY)
Admission: EM | Admit: 2021-02-11 | Discharge: 2021-02-11 | Disposition: A | Discharge status: Home / Self Care | Payer: Medicaid Other | Attending: Student | Admitting: Student

## 2021-02-11 ENCOUNTER — Other Ambulatory Visit: Payer: Self-pay

## 2021-02-11 ENCOUNTER — Emergency Department (HOSPITAL_COMMUNITY)
Admission: EM | Admit: 2021-02-11 | Discharge: 2021-02-11 | Disposition: A | Discharge status: Home / Self Care | Payer: Self-pay | Attending: Emergency Medicine | Admitting: Emergency Medicine

## 2021-02-11 DIAGNOSIS — Z7982 Long term (current) use of aspirin: Secondary | ICD-10-CM | POA: Insufficient documentation

## 2021-02-11 DIAGNOSIS — R0602 Shortness of breath: Secondary | ICD-10-CM | POA: Insufficient documentation

## 2021-02-11 DIAGNOSIS — I10 Essential (primary) hypertension: Secondary | ICD-10-CM

## 2021-02-11 DIAGNOSIS — F17210 Nicotine dependence, cigarettes, uncomplicated: Secondary | ICD-10-CM | POA: Insufficient documentation

## 2021-02-11 DIAGNOSIS — I471 Supraventricular tachycardia: Secondary | ICD-10-CM

## 2021-02-11 DIAGNOSIS — R079 Chest pain, unspecified: Secondary | ICD-10-CM

## 2021-02-11 DIAGNOSIS — R000 Tachycardia, unspecified: Secondary | ICD-10-CM | POA: Insufficient documentation

## 2021-02-11 DIAGNOSIS — R002 Palpitations: Secondary | ICD-10-CM | POA: Insufficient documentation

## 2021-02-11 DIAGNOSIS — Z79899 Other long term (current) drug therapy: Secondary | ICD-10-CM | POA: Insufficient documentation

## 2021-02-11 LAB — CBC WITH DIFFERENTIAL/PLATELET
Abs Immature Granulocytes: 0.06 10*3/uL (ref 0.00–0.07)
Basophils Absolute: 0.1 10*3/uL (ref 0.0–0.1)
Basophils Relative: 1 %
Eosinophils Absolute: 0.1 10*3/uL (ref 0.0–0.5)
Eosinophils Relative: 1 %
HCT: 40.5 % (ref 36.0–46.0)
Hemoglobin: 12.9 g/dL (ref 12.0–15.0)
Immature Granulocytes: 1 %
Lymphocytes Relative: 37 %
Lymphs Abs: 3.1 10*3/uL (ref 0.7–4.0)
MCH: 29.1 pg (ref 26.0–34.0)
MCHC: 31.9 g/dL (ref 30.0–36.0)
MCV: 91.4 fL (ref 80.0–100.0)
Monocytes Absolute: 0.6 10*3/uL (ref 0.1–1.0)
Monocytes Relative: 7 %
Neutro Abs: 4.5 10*3/uL (ref 1.7–7.7)
Neutrophils Relative %: 53 %
Platelets: 397 10*3/uL (ref 150–400)
RBC: 4.43 MIL/uL (ref 3.87–5.11)
RDW: 14.4 % (ref 11.5–15.5)
WBC: 8.5 10*3/uL (ref 4.0–10.5)
nRBC: 0 % (ref 0.0–0.2)

## 2021-02-11 LAB — COMPREHENSIVE METABOLIC PANEL
ALT: 30 U/L (ref 0–44)
AST: 25 U/L (ref 15–41)
Albumin: 3.5 g/dL (ref 3.5–5.0)
Alkaline Phosphatase: 60 U/L (ref 38–126)
Anion gap: 7 (ref 5–15)
BUN: 11 mg/dL (ref 6–20)
CO2: 24 mmol/L (ref 22–32)
Calcium: 9.3 mg/dL (ref 8.9–10.3)
Chloride: 106 mmol/L (ref 98–111)
Creatinine, Ser: 0.73 mg/dL (ref 0.44–1.00)
GFR, Estimated: >60 mL/min (ref >60)
Glucose, Bld: 115 mg/dL — ABNORMAL HIGH (ref 70–99)
Potassium: 3.9 mmol/L (ref 3.5–5.1)
Sodium: 137 mmol/L (ref 135–145)
Total Bilirubin: 0.6 mg/dL (ref 0.3–1.2)
Total Protein: 6.4 g/dL — ABNORMAL LOW (ref 6.5–8.1)

## 2021-02-11 LAB — I-STAT BETA HCG BLOOD, ED (MC, WL, AP ONLY): I-stat hCG, quantitative: <5 m[IU]/mL (ref <5)

## 2021-02-11 LAB — D-DIMER, QUANTITATIVE: D-Dimer, Quant: 0.4 ug/mL-FEU (ref 0.00–0.50)

## 2021-02-11 LAB — TROPONIN I (HIGH SENSITIVITY): Troponin I (High Sensitivity): 7 ng/L (ref <18)

## 2021-02-11 LAB — MAGNESIUM: Magnesium: 1.9 mg/dL (ref 1.7–2.4)

## 2021-02-11 MED ORDER — ADENOSINE 6 MG/2ML IV SOLN
6.0000 mg | Freq: Once | INTRAVENOUS | Status: AC
  Administered 2021-02-11: 6 mg via INTRAVENOUS
  Filled 2021-02-11: qty 2

## 2021-02-11 MED ORDER — SODIUM CHLORIDE 0.9 % IV BOLUS
1000.0000 mL | Freq: Once | INTRAVENOUS | Status: AC
  Administered 2021-02-11: 1000 mL via INTRAVENOUS

## 2021-02-11 MED ORDER — METOPROLOL TARTRATE 25 MG PO TABS
12.5000 mg | ORAL_TABLET | Freq: Two times a day (BID) | ORAL | 0 refills | Status: DC
  Filled 2021-02-11 – 2021-03-10 (×2): qty 90, 90d supply, fill #0
  Filled 2021-03-12: qty 30, 30d supply, fill #0
  Filled 2021-04-10: qty 30, 30d supply, fill #1
  Filled 2021-05-09: qty 30, 30d supply, fill #2

## 2021-02-11 NOTE — ED Provider Notes (Signed)
[facility]PART[facility] Provider Note   CSN: [id number] Arrival date & time: 02/11/21  1649     History Chief Complaint  Patient presents with  . Tachycardia    Lauren [name] is a 55 y.o. female history of hypertension here presenting with tachycardia.  Patient had tachycardia and palpitations since this morning.  Patient states that when she walks to the bathroom she feels very short of breath.  Patient states that she had similar episode when she was in [place] and actually passed out and was admitted to the hospital.  She states that she was never diagnosed with atrial fibrillation but may have SVT.  The history is provided by the patient.       Past Medical History:  Diagnosis Date  . Anxiety   . Arthritis   . Depression   . GERD (gastroesophageal reflux disease)   . Hypertension   . Prediabetes   . Sleep apnea    Past hx - no cpap now     Patient Active Problem List   Diagnosis Date Noted  . Radiculopathy of cervical spine 08/25/2018  . Pain in right hand 07/28/2018  . Trigger finger, right ring finger 07/28/2018  . Mixed hyperlipidemia 06/19/2017  . Gastroesophageal reflux disease 04/15/2017  . OSA (obstructive sleep apnea) 04/15/2017  . Depression 11/28/2016    Past Surgical History:  Procedure Laterality Date  . ABDOMINAL HYSTERECTOMY    . BACK SURGERY    . CESAREAN SECTION    . COLONOSCOPY    . HAND SURGERY    . NECK SURGERY  2002   plate in her neck     OB History    Gravida  1   Para      Term      Preterm      AB      Living  1     SAB      IAB      Ectopic      Multiple      Live Births  1           Family History  Problem Relation Age of Onset  . Diabetes Father   . Healthy Mother   . Colon cancer Neg Hx   . Colon polyps Neg Hx   . Esophageal cancer Neg Hx   . Rectal cancer Neg Hx   . Stomach cancer Neg Hx     Social History   Tobacco Use  . Smoking status: Current Every  Day Smoker    Packs/day: 0.50    Types: Cigarettes  . Smokeless tobacco: Never Used  . Tobacco comment: 0.5 or less   Vaping Use  . Vaping Use: Never used  Substance Use Topics  . Alcohol use: No  . Drug use: No    Home Medications Prior to Admission medications   Medication Sig Start Date End Date Taking? Authorizing Provider  albuterol (VENTOLIN HFA) 108 (90 Base) MCG/ACT inhaler Inhale 1-2 puffs into the lungs every 6 (six) hours as needed for wheezing or shortness of breath. 01/03/21   Gildardo Pounds, NP  aspirin 81 MG EC tablet TAKE 1 TABLET (81 MG TOTAL) BY MOUTH DAILY. 01/03/21 01/03/22  Gildardo Pounds, NP  atorvastatin (LIPITOR) 10 MG tablet TAKE 1 TABLET (10 MG TOTAL) BY MOUTH DAILY. 01/03/21 01/03/22  Gildardo Pounds, NP  gabapentin (NEURONTIN) 300 MG capsule TAKE 1 CAPSULE (300 MG TOTAL) BY MOUTH 3 (THREE) TIMES DAILY.  02/08/21 02/08/22  Gildardo Pounds, NP  ibuprofen (ADVIL) 600 MG tablet Take 1 tablet (600 mg total) by mouth every 8 (eight) hours as needed. 02/08/21   Gildardo Pounds, NP  lisinopril (ZESTRIL) 20 MG tablet TAKE 1 TABLET (20 MG TOTAL) BY MOUTH DAILY. 01/03/21 01/03/22  Gildardo Pounds, NP  metoprolol tartrate (LOPRESSOR) 25 MG tablet Take 0.5 tablets (12.5 mg total) by mouth 2 (two) times daily. 02/11/21 05/12/21  Gildardo Pounds, NP  Misc. Devices MISC Please provide patient with insurance approved supplies for her CPAP: tubing, chin strap and supplies and filters. 11/17/18   Gildardo Pounds, NP  Multiple Vitamin (MULTIVITAMIN) capsule Take by mouth.    [provider]  omeprazole (PRILOSEC) 40 MG capsule TAKE 1 CAPSULE (40 MG TOTAL) BY MOUTH DAILY. 10/31/20 10/31/21  Gildardo Pounds, NP  predniSONE (DELTASONE) 20 MG tablet TAKE 3 TABLET FOR 3 DAYS, THEN 2 TABLETS FOR 3 DAYS, THEN 1.5 TABLETS FOR 3 DAYS, THEN 1 TABLET FOR 3 DAYS, THEN 1/2 TABLET FOR 3 DAYS. 12/25/20 12/25/21  Mesner, Corene Cornea, MD  pregabalin (LYRICA) 75 MG capsule TAKE 1 CAPSULE (75 MG TOTAL) BY  MOUTH 2 (TWO) TIMES DAILY. 10/31/20 05/01/21  Gildardo Pounds, NP  traZODone (DESYREL) 50 MG tablet Take 50 mg by mouth at bedtime.    [provider]  venlafaxine XR (EFFEXOR-XR) 150 MG 24 hr capsule Take 1 capsule (150 mg total) by mouth every morning. 02/08/21   Gildardo Pounds, NP  amitriptyline (ELAVIL) 75 MG tablet Take 1 tablet (75 mg total) by mouth at bedtime. 10/06/18 07/20/20  Gildardo Pounds, NP  Cetirizine HCl 10 MG CAPS Take 1 capsule (10 mg total) by mouth daily for 10 days. 02/19/19 07/20/20  Wieters, Hallie C, PA-C  DULoxetine (CYMBALTA) 30 MG capsule Take 1 capsule (30 mg total) by mouth daily. 09/22/20 01/03/21  Gildardo Pounds, NP  fluticasone (FLONASE) 50 MCG/ACT nasal spray Place 2 sprays into both nostrils daily for 14 days. 02/10/19 07/20/20  Gildardo Pounds, NP  sucralfate (CARAFATE) 1 g tablet Take 1 tablet (1 g total) by mouth 3 (three) times daily with meals for 14 days. 03/15/19 07/20/20  Couture, Cortni S, PA-C    Allergies    Iodinated diagnostic agents  Review of Systems   Review of Systems  Cardiovascular: Positive for palpitations.  Neurological: Positive for dizziness.  All other systems reviewed and are negative.   Physical Exam Updated Vital Signs BP 114/68   Pulse 83   Temp 99.6 F (37.6 C) (Oral)   Resp 19   SpO2 97%   Physical Exam Vitals and nursing note reviewed.  Constitutional:      Comments: Uncomfortable  HENT:     Head: Normocephalic.     Nose: Nose normal.     Mouth/Throat:     Mouth: Mucous membranes are dry.  Eyes:     Extraocular Movements: Extraocular movements intact.     Pupils: Pupils are equal, round, and reactive to light.  Cardiovascular:     Rate and Rhythm: Tachycardia present. Rhythm irregular.     Pulses: Normal pulses.  Pulmonary:     Effort: Pulmonary effort is normal.  Musculoskeletal:        General: Normal range of motion.     Cervical back: Normal range of motion and neck supple.  Skin:    General:  Skin is warm.     Capillary Refill: Capillary refill takes less than 2  seconds.  Neurological:     General: No focal deficit present.  Psychiatric:        Mood and Affect: Mood normal.     ED Results / Procedures / Treatments   Labs (all labs ordered are listed, but only abnormal results are displayed) Labs Reviewed  COMPREHENSIVE METABOLIC PANEL - Abnormal; Notable for the following components:      Result Value   Glucose, Bld 115 (*)    Total Protein 6.4 (*)    All other components within normal limits  CBC WITH DIFFERENTIAL/PLATELET  D-DIMER, QUANTITATIVE  MAGNESIUM  I-STAT BETA HCG BLOOD, ED ([facility], [facility], [facility] ONLY)  TROPONIN I (HIGH SENSITIVITY)    EKG EKG Interpretation  Date/Time:  Sunday February 11 2021 17:07:04 EDT Ventricular Rate:  96 PR Interval:  142 QRS Duration: 76 QT Interval:  305 QTC Calculation: 386 R Axis:   71 Text Interpretation: Sinus arrhythmia Probable left atrial enlargement rateslower than previous Confirmed by Wandra Arthurs [phone]) on 02/11/2021 5:26:46 PM   Radiology DG Chest Port 1 View  Result Date: 02/11/2021 CLINICAL DATA:  Chest pain EXAM: PORTABLE CHEST 1 VIEW COMPARISON:  September 08, 2019 FINDINGS: Transcutaneous pacer leads project over the heart and left lateral chest. No pneumothorax. The lungs are clear. The cardiomediastinal silhouette is unremarkable. IMPRESSION: No active disease. Electronically Signed   By: Dorise Bullion III M.D   On: 02/11/2021 18:17    Procedures Procedures     Medications Ordered in ED Medications  adenosine (ADENOCARD) 6 MG/2ML injection 6 mg (6 mg Intravenous Given 02/11/21 1710)  sodium chloride 0.9 % bolus 1,000 mL (1,000 mLs Intravenous New Bag/Given 02/11/21 1711)    ED Course  I have reviewed the triage vital signs and the nursing notes.  Pertinent labs & imaging results that were available during my care of the patient were reviewed by me and considered in my medical decision making (see chart  for details).    MDM Rules/Calculators/A&P                         Lauren [name] is a 55 y.o. female here with palpitations.  Patient appears to be in SVT at a rate of 160s.  Patient definitely symptomatic from it.  Initially she was slightly hypotensive.  I was able to give her adenosine 6 mg and she converted to sinus rhythm.  Repeat EKG unremarkable.  Her labs unremarkable.  Her heart rate remains in the 80s.  Told her to follow-up with cardiology.  She may need to see EP if she has recurrent SVTs   Final Clinical Impression(s) / ED Diagnoses Final diagnoses:  None    Rx / DC Orders ED Discharge Orders    None       Drenda Freeze, MD 02/11/21 2043

## 2021-02-11 NOTE — ED Triage Notes (Signed)
Pt BIB [facility] from urgent care for SVT. Pt reports sudden onset palpitations and shortness of breath with exertion that started today. Denies hx of the same.

## 2021-02-11 NOTE — ED Notes (Signed)
EMS called

## 2021-02-11 NOTE — Discharge Instructions (Signed)
You likely have SVT.   Avoid drinking caffeine  Stay hydrated   See cardiology for follow-up.  Return to ER if you have worse palpitations, chest pain, trouble breathing

## 2021-02-11 NOTE — ED Provider Notes (Signed)
[facility]    CSN: [id number] Arrival date & time: 02/11/21  1554      History   Chief Complaint No chief complaint on file.   HPI Lauren [name] is a 55 y.o. female   presenting with 12 hours of crushing left-sided chest pain with radiation of pain down left arm, dizziness, shortness of breath, palpitations.  Medical history of hypertension for which she takes lisinopril and beta-blocker.  Also history of prediabetes, sleep apnea, GERD, depression, arthritis, anxiety.  States symptoms are worse with exertion. Took an aspirin before coming here.  HPI  Past Medical History:  Diagnosis Date  . Anxiety   . Arthritis   . Depression   . GERD (gastroesophageal reflux disease)   . Hypertension   . Prediabetes   . Sleep apnea    Past hx - no cpap now     Patient Active Problem List   Diagnosis Date Noted  . Radiculopathy of cervical spine 08/25/2018  . Pain in right hand 07/28/2018  . Trigger finger, right ring finger 07/28/2018  . Mixed hyperlipidemia 06/19/2017  . Gastroesophageal reflux disease 04/15/2017  . OSA (obstructive sleep apnea) 04/15/2017  . Depression 11/28/2016    Past Surgical History:  Procedure Laterality Date  . ABDOMINAL HYSTERECTOMY    . BACK SURGERY    . CESAREAN SECTION    . COLONOSCOPY    . HAND SURGERY    . NECK SURGERY  2002   plate in her neck    OB History    Gravida  1   Para      Term      Preterm      AB      Living  1     SAB      IAB      Ectopic      Multiple      Live Births  1            Home Medications    Prior to Admission medications   Medication Sig Start Date End Date Taking? Authorizing Provider  albuterol (VENTOLIN HFA) 108 (90 Base) MCG/ACT inhaler Inhale 1-2 puffs into the lungs every 6 (six) hours as needed for wheezing or shortness of breath. 01/03/21   Gildardo Pounds, NP  aspirin 81 MG EC tablet TAKE 1 TABLET (81 MG TOTAL) BY MOUTH DAILY. 01/03/21 01/03/22  Gildardo Pounds,  NP  atorvastatin (LIPITOR) 10 MG tablet TAKE 1 TABLET (10 MG TOTAL) BY MOUTH DAILY. 01/03/21 01/03/22  Gildardo Pounds, NP  gabapentin (NEURONTIN) 300 MG capsule TAKE 1 CAPSULE (300 MG TOTAL) BY MOUTH 3 (THREE) TIMES DAILY. 02/08/21 02/08/22  Gildardo Pounds, NP  ibuprofen (ADVIL) 600 MG tablet Take 1 tablet (600 mg total) by mouth every 8 (eight) hours as needed. 02/08/21   Gildardo Pounds, NP  lisinopril (ZESTRIL) 20 MG tablet TAKE 1 TABLET (20 MG TOTAL) BY MOUTH DAILY. 01/03/21 01/03/22  Gildardo Pounds, NP  metoprolol tartrate (LOPRESSOR) 25 MG tablet Take 0.5 tablets (12.5 mg total) by mouth 2 (two) times daily. 01/11/21 04/11/21  Gildardo Pounds, NP  Misc. Devices MISC Please provide patient with insurance approved supplies for her CPAP: tubing, chin strap and supplies and filters. 11/17/18   Gildardo Pounds, NP  Multiple Vitamin (MULTIVITAMIN) capsule Take by mouth.    [provider]  omeprazole (PRILOSEC) 40 MG capsule TAKE 1 CAPSULE (40 MG TOTAL) BY MOUTH DAILY. 10/31/20 10/31/21  Raul Del,  Vernia Buff, NP  predniSONE (DELTASONE) 20 MG tablet TAKE 3 TABLET FOR 3 DAYS, THEN 2 TABLETS FOR 3 DAYS, THEN 1.5 TABLETS FOR 3 DAYS, THEN 1 TABLET FOR 3 DAYS, THEN 1/2 TABLET FOR 3 DAYS. 12/25/20 12/25/21  Mesner, Corene Cornea, MD  pregabalin (LYRICA) 75 MG capsule TAKE 1 CAPSULE (75 MG TOTAL) BY MOUTH 2 (TWO) TIMES DAILY. 10/31/20 05/01/21  Gildardo Pounds, NP  traZODone (DESYREL) 50 MG tablet Take 50 mg by mouth at bedtime.    [provider]  venlafaxine XR (EFFEXOR-XR) 150 MG 24 hr capsule Take 1 capsule (150 mg total) by mouth every morning. 02/08/21   Gildardo Pounds, NP  amitriptyline (ELAVIL) 75 MG tablet Take 1 tablet (75 mg total) by mouth at bedtime. 10/06/18 07/20/20  Gildardo Pounds, NP  Cetirizine HCl 10 MG CAPS Take 1 capsule (10 mg total) by mouth daily for 10 days. 02/19/19 07/20/20  Wieters, Hallie C, PA-C  DULoxetine (CYMBALTA) 30 MG capsule Take 1 capsule (30 mg total) by mouth daily.  09/22/20 01/03/21  Gildardo Pounds, NP  fluticasone (FLONASE) 50 MCG/ACT nasal spray Place 2 sprays into both nostrils daily for 14 days. 02/10/19 07/20/20  Gildardo Pounds, NP  sucralfate (CARAFATE) 1 g tablet Take 1 tablet (1 g total) by mouth 3 (three) times daily with meals for 14 days. 03/15/19 07/20/20  Couture, Cortni S, PA-C    Family History Family History  Problem Relation Age of Onset  . Diabetes Father   . Healthy Mother   . Colon cancer Neg Hx   . Colon polyps Neg Hx   . Esophageal cancer Neg Hx   . Rectal cancer Neg Hx   . Stomach cancer Neg Hx     Social History Social History   Tobacco Use  . Smoking status: Current Every Day Smoker    Packs/day: 0.50    Types: Cigarettes  . Smokeless tobacco: Never Used  . Tobacco comment: 0.5 or less   Vaping Use  . Vaping Use: Never used  Substance Use Topics  . Alcohol use: No  . Drug use: No     Allergies   Iodinated diagnostic agents   Review of Systems Review of Systems  Respiratory: Positive for chest tightness and shortness of breath. Negative for apnea, cough, choking, wheezing and stridor.   Cardiovascular: Positive for chest pain and palpitations. Negative for leg swelling.  All other systems reviewed and are negative.    Physical Exam Triage Vital Signs ED Triage Vitals [02/11/21 1645]  Enc Vitals Group     BP      Pulse      Resp      Temp      Temp src      SpO2      Weight      Height      Head Circumference      Peak Flow      Pain Score 9     Pain Loc      Pain Edu?      Excl. in [place]?    No data found.  Updated Vital Signs There were no vitals taken for this visit.  Visual Acuity Right Eye Distance:   Left Eye Distance:   Bilateral Distance:    Right Eye Near:   Left Eye Near:    Bilateral Near:     Physical Exam Vitals reviewed.  Constitutional:      Appearance: Normal appearance. She is diaphoretic.  HENT:     Head: Normocephalic and atraumatic.     Mouth/Throat:      Mouth: Mucous membranes are moist.  Eyes:     Extraocular Movements: Extraocular movements intact.     Pupils: Pupils are equal, round, and reactive to light.  Cardiovascular:     Rate and Rhythm: Normal rate and regular rhythm.     Pulses:          Radial pulses are 2+ on the right side and 2+ on the left side.     Heart sounds: Normal heart sounds.  Pulmonary:     Effort: Pulmonary effort is normal.     Breath sounds: Normal breath sounds.  Abdominal:     Palpations: Abdomen is soft.     Tenderness: There is no abdominal tenderness. There is no guarding or rebound.  Musculoskeletal:     Right lower leg: No edema.     Left lower leg: No edema.  Skin:    General: Skin is warm.     Capillary Refill: Capillary refill takes less than 2 seconds.  Neurological:     General: No focal deficit present.     Mental Status: She is alert and oriented to person, place, and time.  Psychiatric:        Mood and Affect: Mood normal.        Behavior: Behavior normal.        Thought Content: Thought content normal.        Judgment: Judgment normal.      UC Treatments / Results  Labs (all labs ordered are listed, but only abnormal results are displayed) Labs Reviewed - No data to display  EKG   Radiology No results found.  Procedures Procedures (including critical care time)  Medications Ordered in UC Medications - No data to display  Initial Impression / Assessment and Plan / UC Course  I have reviewed the triage vital signs and the nursing notes.  Pertinent labs & imaging results that were available during my care of the patient were reviewed by me and considered in my medical decision making (see chart for details).     This patient is a 55 year old female presenting with chest pain and SVT. She is diaphoretic but vitals fairly normal. EKG with SVT, changed from 2020 EKG. Did take her antihypertensives today. Sent to ED via EMS.  Final Clinical Impressions(s) / UC Diagnoses    Final diagnoses:  SVT (supraventricular tachycardia) (HCC)  Chest pain, unspecified type  Essential hypertension   Discharge Instructions   None    ED Prescriptions    None     PDMP not reviewed this encounter.   Hazel Sams, PA-C 02/11/21 1709

## 2021-02-12 ENCOUNTER — Other Ambulatory Visit: Payer: Self-pay

## 2021-02-19 ENCOUNTER — Other Ambulatory Visit: Payer: Self-pay

## 2021-02-23 ENCOUNTER — Other Ambulatory Visit: Payer: Self-pay

## 2021-02-23 ENCOUNTER — Encounter (HOSPITAL_COMMUNITY): Payer: Self-pay | Admitting: Emergency Medicine

## 2021-02-23 ENCOUNTER — Emergency Department (HOSPITAL_COMMUNITY)
Admission: EM | Admit: 2021-02-23 | Discharge: 2021-02-23 | Disposition: A | Discharge status: Home / Self Care | Payer: Medicaid Other | Attending: Emergency Medicine | Admitting: Emergency Medicine

## 2021-02-23 DIAGNOSIS — Z5321 Procedure and treatment not carried out due to patient leaving prior to being seen by health care provider: Secondary | ICD-10-CM | POA: Insufficient documentation

## 2021-02-23 DIAGNOSIS — R0602 Shortness of breath: Secondary | ICD-10-CM | POA: Insufficient documentation

## 2021-02-23 NOTE — ED Triage Notes (Signed)
Pt here from home with c/o sob , no chest pain

## 2021-02-28 ENCOUNTER — Other Ambulatory Visit: Payer: Self-pay

## 2021-02-28 MED FILL — Omeprazole Cap Delayed Release 40 MG: ORAL | 30 days supply | Qty: 30 | Fill #1 | Status: AC

## 2021-03-10 ENCOUNTER — Other Ambulatory Visit: Payer: Self-pay | Admitting: Nurse Practitioner

## 2021-03-10 DIAGNOSIS — F339 Major depressive disorder, recurrent, unspecified: Secondary | ICD-10-CM

## 2021-03-10 MED ORDER — VENLAFAXINE HCL ER 150 MG PO CP24
150.0000 mg | ORAL_CAPSULE | Freq: Every morning | ORAL | 0 refills | Status: DC
  Filled 2021-03-10: qty 30, 30d supply, fill #0

## 2021-03-10 MED FILL — Atorvastatin Calcium Tab 10 MG (Base Equivalent): ORAL | 30 days supply | Qty: 30 | Fill #1 | Status: AC

## 2021-03-10 MED FILL — Lisinopril Tab 20 MG: ORAL | 30 days supply | Qty: 30 | Fill #1 | Status: AC

## 2021-03-10 NOTE — Telephone Encounter (Signed)
Requested Prescriptions  Pending Prescriptions Disp Refills  . venlafaxine XR (EFFEXOR-XR) 150 MG 24 hr capsule 30 capsule 0    Sig: Take 1 capsule (150 mg total) by mouth every morning.     Psychiatry: Antidepressants - SNRI - desvenlafaxine & venlafaxine Failed - 03/10/2021  7:39 AM      Failed - LDL in normal range and within 360 days    LDL Chol Calc (NIH)  Date Value Ref Range Status  10/31/2020 106 (H) 0 - 99 mg/dL Final         Failed - Last BP in normal range    BP Readings from Last 1 Encounters:  02/23/21 (!) 158/98         Passed - Total Cholesterol in normal range and within 360 days    Cholesterol, Total  Date Value Ref Range Status  10/31/2020 177 100 - 199 mg/dL Final         Passed - Triglycerides in normal range and within 360 days    Triglycerides  Date Value Ref Range Status  10/31/2020 76 0 - 149 mg/dL Final         Passed - Completed PHQ-2 or PHQ-9 in the last 360 days      Passed - Valid encounter within last 6 months    Recent Outpatient Visits          2 months ago Essential hypertension   [place], [place] W, NP   4 months ago Chronic left-sided low back pain with left-sided sciatica   [place], [place] W, NP   6 months ago Chronic bilateral low back pain with left-sided sciatica   [place], Vernia Buff, NP   2 years ago Essential hypertension   [place], Vernia Buff, NP   2 years ago Essential hypertension   [place], Vernia Buff, NP      Future Appointments            In 3 days Kirsteins, Luanna Salk, MD [facility]   In 3 weeks Gildardo Pounds, NP [place]

## 2021-03-12 ENCOUNTER — Other Ambulatory Visit: Payer: Self-pay

## 2021-03-13 ENCOUNTER — Encounter: Payer: Self-pay | Attending: Physical Medicine & Rehabilitation | Admitting: Physical Medicine & Rehabilitation

## 2021-03-13 ENCOUNTER — Encounter: Payer: Self-pay | Admitting: Physical Medicine & Rehabilitation

## 2021-03-13 ENCOUNTER — Other Ambulatory Visit: Payer: Self-pay

## 2021-03-13 VITALS — BP 129/80 | HR 92 | Temp 98.6°F | Ht 63.8 in | Wt 178.4 lb

## 2021-03-13 DIAGNOSIS — M5442 Lumbago with sciatica, left side: Secondary | ICD-10-CM | POA: Insufficient documentation

## 2021-03-13 NOTE — Patient Instructions (Signed)
Will repeat Left L4-5, L5-S1 epidural injections

## 2021-03-13 NOTE — Progress Notes (Signed)
Subjective:    Patient ID: Lauren [name], female    DOB: [birth year], 55 y.o.   MRN: [id number]  HPI CC:  Chronic low back pain 55 year old female [facility] the evaluation of chronic low back pain, interventional pain consultation. History Lumbar surgery in 2013 or 2014 performed in [place].  She does not remember the exact levels.  I reviewed records from [place] in 2021.  X-rays did not show any instrumentation.  Surgical records from [place] are unavailable.  Spine center records from [place] indicate patient has had microdiscectomy. Used to get cortisone shots, [facility] last performed in April 2021 at the left L4-5 and L5-S1 levels transforaminal route.  Duration of effect was 4 months.  This was helpful in alleviating left lower extremity pain.  The patient has no bowel or bladder dysfunction.  She has no progressive weakness of the left lower extremity.  She has had no fever chills or weight loss. 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 General activity 7 Relation with others 0 Enjoyment of life 0 What TIME of day is your pain at its worst? morning , daytime, evening, night and varies Sleep (in general) Fair  Pain is worse with: walking, standing and some activites Pain improves with: rest Relief from Meds: No relief  walk without assistance ability to climb steps?  yes do you drive?  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  numbness tingling spasms depression anxiety  Any changes since last visit?  no New Patient  Any changes since last visit?  no New Patient    Family  History  Problem Relation Age of Onset  . Diabetes Father   . Healthy Mother   . Colon cancer Neg Hx   . Colon polyps Neg Hx   . Esophageal cancer Neg Hx   . Rectal cancer Neg Hx   . Stomach cancer Neg Hx    Social History   Socioeconomic History  . Marital status: Single    Spouse name: Not on file  . Number of children: Not on file  . Years of education: Not on file  . Highest education level: Not on file  Occupational History  . Not on file  Tobacco Use  . Smoking status: Current Every Day Smoker    Packs/day: 0.50    Types: Cigarettes  . Smokeless tobacco: Never Used  . Tobacco comment: 0.5 or less   Vaping Use  . Vaping Use: Never used  Substance and Sexual Activity  . Alcohol use: No  . Drug use: No  . Sexual activity: Not Currently    Birth control/protection: None  Other Topics Concern  . Not on file  Social History Narrative  . Not on file   Social Determinants of Health   Financial Resource Strain: Not on file  Food Insecurity: Not on file  Transportation Needs: Not on file  Physical Activity: Not on file  Stress: Not on file  Social Connections: Not on file   Past Surgical History:  Procedure Laterality Date  . ABDOMINAL HYSTERECTOMY    . BACK SURGERY    . CESAREAN  SECTION    . COLONOSCOPY    . HAND SURGERY    . NECK SURGERY  2002   plate in her neck   Past Medical History:  Diagnosis Date  . Anxiety   . Arthritis   . Depression   . GERD (gastroesophageal reflux disease)   . Hypertension   . Prediabetes   . Sleep apnea    Past hx - no cpap now    BP 129/80   Pulse 92   Temp 98.6 F (37 C)   Ht 5' 3.8" (1.621 m)   Wt 178 lb 6.4 oz (80.9 kg)   SpO2 99%   BMI 30.81 kg/m   Opioid Risk Score:   Fall Risk Score:  `1  Depression screen PHQ 2/9  Depression screen [facility] 2/9 03/13/2021 01/03/2021 10/31/2020 08/18/2018 07/17/2018 06/09/2018 01/07/2018  Decreased Interest 0 0 0 0 0 0 0  Down, Depressed, Hopeless 0 0 0 0 0 0 0  PHQ - 2 Score  0 0 0 0 0 0 0  Altered sleeping 0 0 3 0 0 0 1  Tired, decreased energy 0 0 1 0 0 0 1  Change in appetite 0 0 1 0 0 0 1  Feeling bad or failure about yourself  0 0 0 0 0 0 0  Trouble concentrating 0 0 0 0 - 0 1  Moving slowly or fidgety/restless 0 0 0 0 0 0 0  Suicidal thoughts 0 0 0 0 0 0 0  PHQ-9 Score 0 0 5 0 0 0 4  Some recent data might be hidden   Review of Systems  Constitutional: Positive for unexpected weight change.  Musculoskeletal: Positive for back pain and gait problem.       Pain Left hip, left leg, left ankle,left buttock   All other systems reviewed and are negative.      Objective:   Physical Exam Vitals and nursing note reviewed.  Constitutional:      Appearance: She is obese.  HENT:     Head: Normocephalic and atraumatic.  Eyes:     Extraocular Movements: Extraocular movements intact.     Conjunctiva/sclera: Conjunctivae normal.     Pupils: Pupils are equal, round, and reactive to light.  Cardiovascular:     Rate and Rhythm: Normal rate and regular rhythm.     Pulses: Normal pulses.     Heart sounds: Normal heart sounds. No murmur heard.   Pulmonary:     Effort: Pulmonary effort is normal. No respiratory distress.     Breath sounds: Normal breath sounds. No stridor.  Abdominal:     General: Abdomen is flat. Bowel sounds are normal. There is no distension.     Palpations: Abdomen is soft.     Tenderness: There is no abdominal tenderness.  Musculoskeletal:     Cervical back: Normal range of motion.     Comments: Mild tenderness to palpation left greater than right lumbosacral junction.  Mild tenderness at the left PSIS No tenderness along the greater trochanters Lumbar range of motion 75% flexion extension lateral bending and rotation.  Skin:    General: Skin is warm and dry.  Neurological:     Mental Status: She is alert and oriented to person, place, and time.     Comments: Motor strength is 5/5 bilateral deltoid, bicep, tricep, grip, hip flexor,  knee extensor, ankle dorsiflexor and plantar flexor Sensation intact bilateral L3-4-5 S1 to pinprick and light touch. Ambulates without assistive device no evidence  of toe drag or knee instability.  Psychiatric:        Mood and Affect: Mood normal.        Behavior: Behavior normal.           Assessment & Plan:  1.  History of left lower extremity sciatica which has in the past responded well to L4-5 L5-S1 transforaminal injections under fluoroscopic guidance last performed over a year ago.  The duration of response was 4 months. We discussed that this can be repeated.  If she does not get a similar response may need additional diagnostic work-up Will give patient some exercises after the procedure.  She has gained about 15 pounds over the last year which has contributed some of her increased pain. Do not think narcotic analgesics will be part of treatment plan.

## 2021-03-15 ENCOUNTER — Other Ambulatory Visit: Payer: Self-pay

## 2021-03-19 MED FILL — Omeprazole Cap Delayed Release 40 MG: ORAL | 30 days supply | Qty: 30 | Fill #2 | Status: AC

## 2021-03-20 ENCOUNTER — Telehealth: Payer: Self-pay

## 2021-03-20 ENCOUNTER — Other Ambulatory Visit: Payer: Self-pay

## 2021-03-20 ENCOUNTER — Ambulatory Visit: Payer: Medicaid Other | Admitting: Physical Medicine & Rehabilitation

## 2021-03-20 NOTE — Telephone Encounter (Signed)
Lauren [name] called requesting an Rx for pain relief.   Patient has a level 8 tingling pain in her left lower leg with left ankle swelling. The pain goes up to the left groin. Per patient she was on her feet at work a lot. And its painful to walk or stand. Ibuprofen, Tylenol or Gabapentin has not helped.   Please advise: Call back ph# [phone].

## 2021-03-21 MED ORDER — TRAMADOL HCL 50 MG PO TABS: 50.0000 mg | ORAL_TABLET | Freq: Four times a day (QID) | ORAL | 0 refills | Status: DC | PRN

## 2021-03-21 NOTE — Telephone Encounter (Signed)
Per Dr Letta Pate,  Can we schedule the Left L4-5 L5-S1 Transforaminal ESI sooner?  May call in 7 d Rx for tramadol 50mg  1 po q 6 h prn #28 no RF    Tramadol called to [facility]. I have asked Judeen Hammans to look at scheduling the injection.

## 2021-03-21 NOTE — Addendum Note (Signed)
Addended by: Caro Hight on: 03/21/2021 04:30 PM   Modules accepted: Orders

## 2021-03-22 ENCOUNTER — Other Ambulatory Visit: Payer: Self-pay

## 2021-04-01 ENCOUNTER — Ambulatory Visit (HOSPITAL_COMMUNITY)
Admission: EM | Admit: 2021-04-01 | Discharge: 2021-04-01 | Disposition: A | Discharge status: Home / Self Care | Payer: Medicaid Other | Attending: Emergency Medicine | Admitting: Emergency Medicine

## 2021-04-01 ENCOUNTER — Encounter (HOSPITAL_COMMUNITY): Payer: Self-pay | Admitting: Emergency Medicine

## 2021-04-01 DIAGNOSIS — M5412 Radiculopathy, cervical region: Secondary | ICD-10-CM

## 2021-04-01 MED ORDER — PREDNISONE 50 MG PO TABS: 50.0000 mg | ORAL_TABLET | Freq: Every day | ORAL | 0 refills | Status: DC

## 2021-04-01 NOTE — Discharge Instructions (Addendum)
Take the prednisone daily for the next 5 days.   Call your spine specialist to make sure that taking the prednisone will not prevent you from getting the spinal injections as scheduled.    You can use heat, ice, or alternate between heat and ice for comfort.   Return or go to the Emergency Department if symptoms worsen or do not improve in the next few days.

## 2021-04-01 NOTE — ED Triage Notes (Signed)
Pt presents with left neck and shoulder pain Xs 2 weeks. States has hx of nerves and back pain. Will be getting back injections next week and currently on tramadol for back pain.

## 2021-04-01 NOTE — ED Provider Notes (Signed)
[place]    CSN: [id number] Arrival date & time: 04/01/21  1335      History   Chief Complaint Chief Complaint  Patient presents with   Shoulder Pain   Torticollis    HPI Lauren [name] is a 55 y.o. female.   Patient here for evaluation of left shoulder and neck pain that has been going on for the past 2 weeks.  Reports having numbness and tingling that radiates down her left arm.  Reports history of spinal surgery and is going to be having injections in her back for lumbar radiculopathy.  Reports taking Ultram for pain but states running out. Denies any trauma, injury, or other precipitating event.  Denies any specific alleviating or aggravating factors.  Denies any fevers, chest pain, shortness of breath, N/V/D, numbness, tingling, weakness, abdominal pain, or headaches.     The history is provided by the patient.  Shoulder Pain Associated symptoms: neck pain   Associated symptoms: no back pain    Past Medical History:  Diagnosis Date   Anxiety    Arthritis    Depression    GERD (gastroesophageal reflux disease)    Hypertension    Prediabetes    Sleep apnea    Past hx - no cpap now     Patient Active Problem List   Diagnosis Date Noted   Radiculopathy of cervical spine 08/25/2018   Pain in right hand 07/28/2018   Trigger finger, right ring finger 07/28/2018   Mixed hyperlipidemia 06/19/2017   Gastroesophageal reflux disease 04/15/2017   OSA (obstructive sleep apnea) 04/15/2017   Depression 11/28/2016    Past Surgical History:  Procedure Laterality Date   ABDOMINAL HYSTERECTOMY     BACK SURGERY     CESAREAN SECTION     COLONOSCOPY     HAND SURGERY     NECK SURGERY  2002   plate in her neck    OB History     Gravida  1   Para      Term      Preterm      AB      Living  1      SAB      IAB      Ectopic      Multiple      Live Births  1            Home Medications    Prior to Admission medications    Medication Sig Start Date End Date Taking? Authorizing Provider  albuterol (VENTOLIN HFA) 108 (90 Base) MCG/ACT inhaler Inhale 1-2 puffs into the lungs every 6 (six) hours as needed for wheezing or shortness of breath. 01/03/21   Gildardo Pounds, NP  aspirin 81 MG EC tablet TAKE 1 TABLET (81 MG TOTAL) BY MOUTH DAILY. 01/03/21 01/03/22  Gildardo Pounds, NP  atorvastatin (LIPITOR) 10 MG tablet TAKE 1 TABLET (10 MG TOTAL) BY MOUTH DAILY. 01/03/21 01/03/22  Gildardo Pounds, NP  gabapentin (NEURONTIN) 300 MG capsule TAKE 1 CAPSULE (300 MG TOTAL) BY MOUTH 3 (THREE) TIMES DAILY. 02/08/21 02/08/22  Gildardo Pounds, NP  ibuprofen (ADVIL) 600 MG tablet Take 1 tablet (600 mg total) by mouth every 8 (eight) hours as needed. 02/08/21   Gildardo Pounds, NP  lisinopril (ZESTRIL) 20 MG tablet TAKE 1 TABLET (20 MG TOTAL) BY MOUTH DAILY. 01/03/21 01/03/22  Gildardo Pounds, NP  metoprolol tartrate (LOPRESSOR) 25 MG tablet Take 0.5 tablets (12.5 mg total) by  mouth 2 (two) times daily. 02/11/21 05/12/21  Gildardo Pounds, NP  Misc. Devices MISC Please provide patient with insurance approved supplies for her CPAP: tubing, chin strap and supplies and filters. 11/17/18   Gildardo Pounds, NP  Multiple Vitamin (MULTIVITAMIN) capsule Take by mouth.    [provider]  omeprazole (PRILOSEC) 40 MG capsule TAKE 1 CAPSULE (40 MG TOTAL) BY MOUTH DAILY. 10/31/20 10/31/21  Gildardo Pounds, NP  predniSONE (DELTASONE) 50 MG tablet Take 1 tablet (50 mg total) by mouth daily for 5 days. 04/01/21 04/06/21 Yes Pearson Forster, NP  pregabalin (LYRICA) 75 MG capsule TAKE 1 CAPSULE (75 MG TOTAL) BY MOUTH 2 (TWO) TIMES DAILY. Patient not taking: Reported on 03/13/2021 10/31/20 05/01/21  Gildardo Pounds, NP  traMADol (ULTRAM) 50 MG tablet Take 1 tablet (50 mg total) by mouth every 6 (six) hours as needed. 03/21/21   Kirsteins, Luanna Salk, MD  traZODone (DESYREL) 50 MG tablet Take 50 mg by mouth at bedtime.    [provider]  venlafaxine  XR (EFFEXOR-XR) 150 MG 24 hr capsule Take 1 capsule (150 mg total) by mouth every morning. 03/10/21   Gildardo Pounds, NP  amitriptyline (ELAVIL) 75 MG tablet Take 1 tablet (75 mg total) by mouth at bedtime. 10/06/18 07/20/20  Gildardo Pounds, NP  Cetirizine HCl 10 MG CAPS Take 1 capsule (10 mg total) by mouth daily for 10 days. 02/19/19 07/20/20  Wieters, Hallie C, PA-C  DULoxetine (CYMBALTA) 30 MG capsule Take 1 capsule (30 mg total) by mouth daily. 09/22/20 01/03/21  Gildardo Pounds, NP  fluticasone (FLONASE) 50 MCG/ACT nasal spray Place 2 sprays into both nostrils daily for 14 days. 02/10/19 07/20/20  Gildardo Pounds, NP  sucralfate (CARAFATE) 1 g tablet Take 1 tablet (1 g total) by mouth 3 (three) times daily with meals for 14 days. 03/15/19 07/20/20  Couture, Cortni S, PA-C    Family History Family History  Problem Relation Age of Onset   Diabetes Father    Healthy Mother    Colon cancer Neg Hx    Colon polyps Neg Hx    Esophageal cancer Neg Hx    Rectal cancer Neg Hx    Stomach cancer Neg Hx     Social History Social History   Tobacco Use   Smoking status: Every Day    Packs/day: 0.50    Pack years: 0.00    Types: Cigarettes   Smokeless tobacco: Never   Tobacco comments:    0.5 or less   Vaping Use   Vaping Use: Never used  Substance Use Topics   Alcohol use: No   Drug use: No     Allergies   Iodinated diagnostic agents   Review of Systems Review of Systems  Musculoskeletal:  Positive for arthralgias, joint swelling and neck pain. Negative for back pain.  All other systems reviewed and are negative.   Physical Exam Triage Vital Signs ED Triage Vitals  Enc Vitals Group     BP 04/01/21 1354 (!) 119/54     Pulse Rate 04/01/21 1354 61     Resp 04/01/21 1354 16     Temp 04/01/21 1354 98.5 F (36.9 C)     Temp Source 04/01/21 1354 Oral     SpO2 04/01/21 1354 99 %     Weight --      Height --      Head Circumference --      Peak Flow --  Pain Score  04/01/21 1351 8     Pain Loc --      Pain Edu? --      Excl. in [place]? --    No data found.  Updated Vital Signs BP (!) 119/54 (BP Location: Right Arm)   Pulse 61   Temp 98.5 F (36.9 C) (Oral)   Resp 16   SpO2 99%   Visual Acuity Right Eye Distance:   Left Eye Distance:   Bilateral Distance:    Right Eye Near:   Left Eye Near:    Bilateral Near:     Physical Exam Vitals and nursing note reviewed.  Constitutional:      General: She is not in acute distress.    Appearance: Normal appearance. She is not ill-appearing, toxic-appearing or diaphoretic.  HENT:     Head: Normocephalic and atraumatic.  Eyes:     Conjunctiva/sclera: Conjunctivae normal.  Cardiovascular:     Rate and Rhythm: Normal rate.     Pulses: Normal pulses.  Pulmonary:     Effort: Pulmonary effort is normal.  Abdominal:     General: Abdomen is flat.  Musculoskeletal:        General: Normal range of motion.     Right shoulder: Normal.     Left shoulder: No tenderness or bony tenderness. Normal range of motion. Normal strength. Normal pulse.     Cervical back: Normal range of motion. Tenderness present. No swelling, deformity or bony tenderness.  Skin:    General: Skin is warm and dry.  Neurological:     General: No focal deficit present.     Mental Status: She is alert and oriented to person, place, and time.  Psychiatric:        Mood and Affect: Mood normal.     UC Treatments / Results  Labs (all labs ordered are listed, but only abnormal results are displayed) Labs Reviewed - No data to display  EKG   Radiology No results found.  Procedures Procedures (including critical care time)  Medications Ordered in UC Medications - No data to display  Initial Impression / Assessment and Plan / UC Course  I have reviewed the triage vital signs and the nursing notes.  Pertinent labs & imaging results that were available during my care of the patient were reviewed by me and considered in my  medical decision making (see chart for details).    Assessment negative for red flags or concerns.  This is likely cervical radiculopathy.  Prednisone daily for the next 5 days.  Patient encouraged to contact spine specialist to ensure that prednisone will not disrupt plan for spinal injections.  May use heat, ice, or alternate between heat and ice for comfort.  Follow-up with primary care as needed.  Final Clinical Impressions(s) / UC Diagnoses   Final diagnoses:  Cervical radiculitis     Discharge Instructions      Take the prednisone daily for the next 5 days.   Call your spine specialist to make sure that taking the prednisone will not prevent you from getting the spinal injections as scheduled.    You can use heat, ice, or alternate between heat and ice for comfort.   Return or go to the Emergency Department if symptoms worsen or do not improve in the next few days.      ED Prescriptions     Medication Sig Dispense Auth. Provider   predniSONE (DELTASONE) 50 MG tablet Take 1 tablet (50 mg total) by mouth  daily for 5 days. 5 tablet Pearson Forster, NP      PDMP not reviewed this encounter.   Pearson Forster, NP 04/01/21 1445

## 2021-04-06 ENCOUNTER — Other Ambulatory Visit: Payer: Self-pay

## 2021-04-06 ENCOUNTER — Ambulatory Visit: Payer: Self-pay | Attending: Nurse Practitioner | Admitting: Nurse Practitioner

## 2021-04-06 ENCOUNTER — Encounter: Payer: Self-pay | Admitting: Nurse Practitioner

## 2021-04-06 VITALS — BP 125/74 | HR 69 | Ht 63.8 in | Wt 172.6 lb

## 2021-04-06 DIAGNOSIS — E785 Hyperlipidemia, unspecified: Secondary | ICD-10-CM

## 2021-04-06 DIAGNOSIS — R7303 Prediabetes: Secondary | ICD-10-CM

## 2021-04-06 DIAGNOSIS — I10 Essential (primary) hypertension: Secondary | ICD-10-CM

## 2021-04-06 DIAGNOSIS — M25512 Pain in left shoulder: Secondary | ICD-10-CM

## 2021-04-06 MED ORDER — PREGABALIN 75 MG PO CAPS
ORAL_CAPSULE | ORAL | 0 refills | Status: DC
  Filled 2021-04-06: qty 60, 30d supply, fill #0

## 2021-04-06 MED ORDER — LISINOPRIL 20 MG PO TABS
ORAL_TABLET | Freq: Every day | ORAL | 0 refills | Status: DC
Start: 2021-04-06 — End: 2021-07-12
  Filled 2021-04-06: qty 30, 30d supply, fill #0
  Filled 2021-05-09: qty 30, 30d supply, fill #1
  Filled 2021-06-12: qty 30, 30d supply, fill #2

## 2021-04-06 MED ORDER — ATORVASTATIN CALCIUM 10 MG PO TABS
ORAL_TABLET | Freq: Every day | ORAL | 1 refills | Status: DC
  Filled 2021-04-06: qty 30, 30d supply, fill #0
  Filled 2021-05-09: qty 30, 30d supply, fill #1
  Filled 2021-06-12: qty 30, 30d supply, fill #2
  Filled 2021-07-12: qty 30, 30d supply, fill #3
  Filled 2021-08-11: qty 30, 30d supply, fill #4
  Filled 2021-09-10: qty 30, 30d supply, fill #5

## 2021-04-06 MED ORDER — PREGABALIN 75 MG PO CAPS: ORAL_CAPSULE | ORAL | 0 refills | Status: DC

## 2021-04-06 NOTE — Progress Notes (Signed)
Assessment & Plan:  Lauren [name] was seen today for hypertension.  Diagnoses and all orders for this visit:  Essential hypertension -     lisinopril (ZESTRIL) 20 MG tablet; TAKE 1 TABLET (20 MG TOTAL) BY MOUTH DAILY. Continue all antihypertensives as prescribed.  Remember to bring in your blood pressure log with you for your follow up appointment.  DASH/Mediterranean Diets are healthier choices for HTN.    Dyslipidemia, goal LDL below 100 -     atorvastatin (LIPITOR) 10 MG tablet; TAKE 1 TABLET (10 MG TOTAL) BY MOUTH DAILY. INSTRUCTIONS: Work on a low fat, heart healthy diet and participate in regular aerobic exercise program by working out at least 150 minutes per week; 5 days a week-30 minutes per day. Avoid red meat/beef/steak,  fried foods. junk foods, sodas, sugary drinks, unhealthy snacking, alcohol and smoking.  Drink at least 80 oz of water per day and monitor your carbohydrate intake daily.    Acute pain of left shoulder -     pregabalin (LYRICA) 75 MG capsule; TAKE 1 CAPSULE (75 MG TOTAL) BY MOUTH 2 (TWO) TIMES DAILY.  May alternate with heat and ice application for pain relief. May also alternate with acetaminophen  as prescribed pain relief. Other alternatives include massage, acupuncture and water aerobics.     Prediabetes -     Hemoglobin A1c   Patient has been counseled on age-appropriate routine health concerns for screening and prevention. These are reviewed and up-to-date. Referrals have been placed accordingly. Immunizations are up-to-date or declined.    Subjective:   Chief Complaint  Patient presents with   Hypertension   HPI Lauren [name] 55 y.o. female presents to office today for BP follow up. She has a past medical history of Anxiety, Arthritis, Depression, GERD Hypertension, Prediabetes, and Sleep apnea.   She is doing well today with no concerns or complaints.    Hypertension Blood pressure is well controlled. Taking lisinopril 20 mg dialy as  prescribed and lopressor 12.5 mg BID. Denies chest pain, shortness of breath, palpitations, lightheadedness, dizziness, headaches or BLE edema.   BP Readings from Last 3 Encounters:  04/06/21 125/74  04/01/21 (!) 119/54  03/13/21 129/80    Dyslipidemia LDL not optimal. She is taking atorvastatin 10 mg daily as prescribed. Denies any statin intolerance or myalgias Lab Results  Component Value Date   LDLCALC 106 (H) 10/31/2020   The 10-year ASCVD risk score Mikey Bussing DC Jr., et al., 2013) is: 7.7%   Values used to calculate the score:     Age: 72 years     Sex: Female     Is Non-Hispanic African American: Yes     Diabetic: No     Tobacco smoker: Yes     Systolic Blood Pressure: 841 mmHg     Is BP treated: Yes     HDL Cholesterol: 57 mg/dL     Total Cholesterol: 177 mg/dL   Prediabetes Well controlled without the use of any oral diabetic medications.  Lab Results  Component Value Date   HGBA1C 5.7 10/31/2020     Left shoulder pain Endorses left shoulder pain. States she was told she has a pinched nerve in her left shoulder. I did recommend rest and light duty at work for the next 2 weeks as she states she was lifting heavy items at work when she first noticed the pain. She declines light duty work note today. She has taken gabapentin in the past and states it did not help to  relieve her pain.    Review of Systems  Constitutional:  Negative for fever, malaise/fatigue and weight loss.  HENT: Negative.  Negative for nosebleeds.   Eyes: Negative.  Negative for blurred vision, double vision and photophobia.  Respiratory: Negative.  Negative for cough and shortness of breath.   Cardiovascular: Negative.  Negative for chest pain, palpitations and leg swelling.  Gastrointestinal: Negative.  Negative for heartburn, nausea and vomiting.  Musculoskeletal:  Positive for joint pain (see hpi). Negative for myalgias.  Neurological: Negative.  Negative for dizziness, focal weakness, seizures and  headaches.  Psychiatric/Behavioral: Negative.  Negative for suicidal ideas.    Past Medical History:  Diagnosis Date   Anxiety    Arthritis    Depression    GERD (gastroesophageal reflux disease)    Hypertension    Prediabetes    Sleep apnea    Past hx - no cpap now     Past Surgical History:  Procedure Laterality Date   ABDOMINAL HYSTERECTOMY     BACK SURGERY     CESAREAN SECTION     COLONOSCOPY     HAND SURGERY     NECK SURGERY  2002   plate in her neck    Family History  Problem Relation Age of Onset   Diabetes Father    Healthy Mother    Colon cancer Neg Hx    Colon polyps Neg Hx    Esophageal cancer Neg Hx    Rectal cancer Neg Hx    Stomach cancer Neg Hx     Social History Reviewed with no changes to be made today.   Outpatient Medications Prior to Visit  Medication Sig Dispense Refill   albuterol (VENTOLIN HFA) 108 (90 Base) MCG/ACT inhaler Inhale 1-2 puffs into the lungs every 6 (six) hours as needed for wheezing or shortness of breath. 18 g 0   aspirin 81 MG EC tablet TAKE 1 TABLET (81 MG TOTAL) BY MOUTH DAILY. 90 tablet 3   ibuprofen (ADVIL) 600 MG tablet Take 1 tablet (600 mg total) by mouth every 8 (eight) hours as needed. 60 tablet 1   metoprolol tartrate (LOPRESSOR) 25 MG tablet Take 0.5 tablets (12.5 mg total) by mouth 2 (two) times daily. 90 tablet 0   Misc. Devices MISC Please provide patient with insurance approved supplies for her CPAP: tubing, chin strap and supplies and filters. 1 each 0   Multiple Vitamin (MULTIVITAMIN) capsule Take by mouth.     omeprazole (PRILOSEC) 40 MG capsule TAKE 1 CAPSULE (40 MG TOTAL) BY MOUTH DAILY. 90 capsule 1   venlafaxine XR (EFFEXOR-XR) 150 MG 24 hr capsule Take 1 capsule (150 mg total) by mouth every morning. 30 capsule 0   atorvastatin (LIPITOR) 10 MG tablet TAKE 1 TABLET (10 MG TOTAL) BY MOUTH DAILY. 90 tablet 1   lisinopril (ZESTRIL) 20 MG tablet TAKE 1 TABLET (20 MG TOTAL) BY MOUTH DAILY. 90 tablet 0    traMADol (ULTRAM) 50 MG tablet Take 1 tablet (50 mg total) by mouth every 6 (six) hours as needed. (Patient not taking: Reported on 04/06/2021) 28 tablet 0   traZODone (DESYREL) 50 MG tablet Take 50 mg by mouth at bedtime. (Patient not taking: Reported on 04/06/2021)     gabapentin (NEURONTIN) 300 MG capsule TAKE 1 CAPSULE (300 MG TOTAL) BY MOUTH 3 (THREE) TIMES DAILY. (Patient not taking: Reported on 04/06/2021) 90 capsule 1   predniSONE (DELTASONE) 50 MG tablet Take 1 tablet (50 mg total) by mouth daily for 5  days. (Patient not taking: Reported on 04/06/2021) 5 tablet 0   pregabalin (LYRICA) 75 MG capsule TAKE 1 CAPSULE (75 MG TOTAL) BY MOUTH 2 (TWO) TIMES DAILY. (Patient not taking: Reported on 03/13/2021) 60 capsule 1   No facility-administered medications prior to visit.    Allergies  Allergen Reactions   Iodinated Diagnostic Agents Nausea Only       Objective:    BP 125/74   Pulse 69   Ht 5' 3.8" (1.621 m)   Wt 172 lb 9.6 oz (78.3 kg)   SpO2 98%   BMI 29.81 kg/m  Wt Readings from Last 3 Encounters:  04/06/21 172 lb 9.6 oz (78.3 kg)  03/13/21 178 lb 6.4 oz (80.9 kg)  01/03/21 173 lb 3.2 oz (78.6 kg)    Physical Exam Vitals and nursing note reviewed.  Constitutional:      Appearance: She is well-developed.  HENT:     Head: Normocephalic and atraumatic.  Cardiovascular:     Rate and Rhythm: Normal rate and regular rhythm.     Heart sounds: Normal heart sounds. No murmur heard.   No friction rub. No gallop.  Pulmonary:     Effort: Pulmonary effort is normal. No tachypnea or respiratory distress.     Breath sounds: Normal breath sounds. No decreased breath sounds, wheezing, rhonchi or rales.  Chest:     Chest wall: No tenderness.  Abdominal:     General: Bowel sounds are normal.     Palpations: Abdomen is soft.  Musculoskeletal:        General: Normal range of motion.     Cervical back: Normal range of motion.  Skin:    General: Skin is warm and dry.  Neurological:      Mental Status: She is alert and oriented to person, place, and time.     Coordination: Coordination normal.  Psychiatric:        Behavior: Behavior normal. Behavior is cooperative.        Thought Content: Thought content normal.        Judgment: Judgment normal.         Patient has been counseled extensively about nutrition and exercise as well as the importance of adherence with medications and regular follow-up. The patient was given clear instructions to go to ER or return to medical center if symptoms don't improve, worsen or new problems develop. The patient verbalized understanding.   Follow-up: Return in about 3 months (around 07/07/2021) for HTN.   Gildardo Pounds, FNP-BC [facility] [place], [place]   04/06/2021, 10:32 AM

## 2021-04-07 LAB — HEMOGLOBIN A1C
Est. average glucose Bld gHb Est-mCnc: 128 mg/dL
Hgb A1c MFr Bld: 6.1 % — ABNORMAL HIGH (ref 4.8–5.6)

## 2021-04-09 ENCOUNTER — Other Ambulatory Visit: Payer: Self-pay

## 2021-04-10 ENCOUNTER — Other Ambulatory Visit: Payer: Self-pay

## 2021-04-10 ENCOUNTER — Other Ambulatory Visit: Payer: Self-pay | Admitting: Nurse Practitioner

## 2021-04-10 DIAGNOSIS — K219 Gastro-esophageal reflux disease without esophagitis: Secondary | ICD-10-CM

## 2021-04-10 DIAGNOSIS — F339 Major depressive disorder, recurrent, unspecified: Secondary | ICD-10-CM

## 2021-04-10 DIAGNOSIS — Z1159 Encounter for screening for other viral diseases: Secondary | ICD-10-CM

## 2021-04-10 MED ORDER — OMEPRAZOLE 40 MG PO CPDR
DELAYED_RELEASE_CAPSULE | Freq: Every day | ORAL | 0 refills | Status: DC
Start: 2021-04-10 — End: 2021-07-03
  Filled 2021-04-10: qty 90, fill #0
  Filled 2021-05-04: qty 30, 30d supply, fill #0
  Filled 2021-05-24: qty 30, 30d supply, fill #1
  Filled 2021-06-12: qty 30, 30d supply, fill #2

## 2021-04-10 MED ORDER — VENLAFAXINE HCL ER 150 MG PO CP24
150.0000 mg | ORAL_CAPSULE | Freq: Every morning | ORAL | 0 refills | Status: DC
  Filled 2021-04-10: qty 30, 30d supply, fill #0
  Filled 2021-05-13: qty 30, 30d supply, fill #1
  Filled 2021-06-12: qty 30, 30d supply, fill #2

## 2021-04-13 ENCOUNTER — Other Ambulatory Visit: Payer: Self-pay

## 2021-04-16 ENCOUNTER — Telehealth: Payer: Self-pay | Admitting: *Deleted

## 2021-04-16 NOTE — Telephone Encounter (Signed)
Lauren [name] called and has an appt for injection on Thursday but is asking if there is anything for pain that she can have until that time.  Please advise.

## 2021-04-18 NOTE — Telephone Encounter (Signed)
Per Dr Letta Pate, he first ordered gabapentin but she is on lyrica, so "Would recommend that patient call Dr. Raul Del to see if she can increase her Lyrica to 3 times per day.  Her PCP Dr. Raul Del prescribes this medication ".    I talked to her and she said gabapentin didn't work so they put her on lyrica but she never got it because it was backordered, but at this point she is just going to wait until injection tomorrow.  She has been using biofreeze.

## 2021-04-19 ENCOUNTER — Encounter: Payer: Self-pay | Admitting: Physical Medicine & Rehabilitation

## 2021-04-19 ENCOUNTER — Other Ambulatory Visit: Payer: Self-pay

## 2021-04-19 ENCOUNTER — Encounter: Payer: Self-pay | Attending: Physical Medicine & Rehabilitation | Admitting: Physical Medicine & Rehabilitation

## 2021-04-19 VITALS — BP 110/75 | HR 61 | Temp 99.1°F | Ht 63.8 in | Wt 169.2 lb

## 2021-04-19 DIAGNOSIS — M5416 Radiculopathy, lumbar region: Secondary | ICD-10-CM | POA: Insufficient documentation

## 2021-04-19 NOTE — Progress Notes (Signed)
  PROCEDURE RECORD [facility]   Name: Lauren [name] DOB:April 28, 1966 MRN: [id number]  Date:04/19/2021  Physician: Alysia Penna, MD    Nurse/CMA: Truman Hayward CMA  Allergies:  Allergies  Allergen Reactions   Iodinated Diagnostic Agents Nausea Only    Consent Signed: Yes.    Is patient diabetic? No.  CBG today? N/a  Pregnant: No. LMP: No LMP recorded. Patient has had a hysterectomy. (age 55-55)  Anticoagulants: no Anti-inflammatory: no Antibiotics: no  Procedure: L4-5,L5-S1 epidural steroid injection  Position: Prone Start Time: 4:13 pm  End Time: 4:20 pm  Fluoro Time: 28  RN/CMA ShumakerRN Lee CMA    Time 3:17 4:28pm    BP 110/75 148/73    Pulse 61 68    Respirations 14 14    O2 Sat 99 98    S/S 6 6    Pain Level 6/10 0/10     D/C home with daughter, patient A & O X 3, D/C instructions reviewed, and sits independently.

## 2021-04-19 NOTE — Patient Instructions (Signed)

## 2021-04-19 NOTE — Progress Notes (Signed)
LEFT L4-5, LEFT L5-S1 Lumbar transforaminal epidural steroid injection under fluoroscopic guidance with contrast enhancement  Indication: Lumbosacral radiculitis is not relieved by medication management or other conservative care and interfering with self-care and mobility.   Informed consent was obtained after describing risk and benefits of the procedure with the patient, this includes bleeding, bruising, infection, paralysis and medication side effects.  The patient wishes to proceed and has given written consent.  Patient was placed in prone position.  The lumbar area was marked and prepped with Betadine.  It was entered with a 25-gauge 1-1/2 inch needle and one mL of 1% lidocaine was injected into the skin and subcutaneous tissue.  Then a 22-gauge 3.5" spinal needle was inserted into the Left L4-5 intervertebral foramen under AP, lateral, and oblique view.  Once needle tip was within the foramen on lateral views nor exceeding 6 o clock position on th epedical on AP viewed Isovue 200 was inected x 30ml Then a solution containing one mL of 10 mg per mL dexamethasone and 2 mL of 1% lidocaine was injected. Then a 22-gauge 3.5" spinal needle was inserted into the Left L5-S1 intervertebral foramen under AP, lateral, and oblique view.  Once needle tip was within the foramen on lateral views nor exceeding 6 o clock position on th epedical on AP viewed Isovue 200 was inected x 59ml Then a solution containing one mL of 10 mg per mL dexamethasone and 2 mL of 1% lidocaine was injected The patient tolerated procedure well.  Post procedure instructions were given.  Please see post procedure form.

## 2021-04-26 ENCOUNTER — Encounter: Payer: Self-pay | Admitting: Nurse Practitioner

## 2021-04-27 ENCOUNTER — Emergency Department (HOSPITAL_COMMUNITY)
Admission: EM | Admit: 2021-04-27 | Discharge: 2021-04-27 | Disposition: A | Discharge status: Home / Self Care | Payer: Medicaid Other | Attending: Emergency Medicine | Admitting: Emergency Medicine

## 2021-04-27 ENCOUNTER — Encounter (HOSPITAL_COMMUNITY): Payer: Self-pay | Admitting: Emergency Medicine

## 2021-04-27 ENCOUNTER — Other Ambulatory Visit: Payer: Self-pay

## 2021-04-27 ENCOUNTER — Encounter (HOSPITAL_BASED_OUTPATIENT_CLINIC_OR_DEPARTMENT_OTHER): Payer: Self-pay | Admitting: Emergency Medicine

## 2021-04-27 ENCOUNTER — Emergency Department (HOSPITAL_BASED_OUTPATIENT_CLINIC_OR_DEPARTMENT_OTHER)
Admission: EM | Admit: 2021-04-27 | Discharge: 2021-04-27 | Disposition: A | Discharge status: Home / Self Care | Payer: Medicaid Other | Attending: Emergency Medicine | Admitting: Emergency Medicine

## 2021-04-27 ENCOUNTER — Other Ambulatory Visit (HOSPITAL_BASED_OUTPATIENT_CLINIC_OR_DEPARTMENT_OTHER): Payer: Self-pay

## 2021-04-27 DIAGNOSIS — F17210 Nicotine dependence, cigarettes, uncomplicated: Secondary | ICD-10-CM | POA: Insufficient documentation

## 2021-04-27 DIAGNOSIS — M25512 Pain in left shoulder: Secondary | ICD-10-CM | POA: Insufficient documentation

## 2021-04-27 DIAGNOSIS — I10 Essential (primary) hypertension: Secondary | ICD-10-CM | POA: Insufficient documentation

## 2021-04-27 DIAGNOSIS — Z7982 Long term (current) use of aspirin: Secondary | ICD-10-CM | POA: Insufficient documentation

## 2021-04-27 DIAGNOSIS — Z79899 Other long term (current) drug therapy: Secondary | ICD-10-CM | POA: Insufficient documentation

## 2021-04-27 DIAGNOSIS — M5412 Radiculopathy, cervical region: Secondary | ICD-10-CM | POA: Insufficient documentation

## 2021-04-27 MED ORDER — KETOROLAC TROMETHAMINE 30 MG/ML IJ SOLN
30.0000 mg | Freq: Once | INTRAMUSCULAR | Status: DC
  Filled 2021-04-27: qty 1

## 2021-04-27 MED ORDER — METHOCARBAMOL 500 MG PO TABS
500.0000 mg | ORAL_TABLET | Freq: Two times a day (BID) | ORAL | 0 refills | Status: DC
  Filled 2021-04-27: qty 20, 10d supply, fill #0

## 2021-04-27 MED ORDER — KETOROLAC TROMETHAMINE 30 MG/ML IJ SOLN
60.0000 mg | Freq: Once | INTRAMUSCULAR | Status: AC
  Administered 2021-04-27: 60 mg via INTRAMUSCULAR
  Filled 2021-04-27: qty 2

## 2021-04-27 MED ORDER — PREDNISONE 10 MG PO TABS
ORAL_TABLET | ORAL | 0 refills | Status: DC
  Filled 2021-04-27: qty 21, 6d supply, fill #0

## 2021-04-27 NOTE — ED Triage Notes (Signed)
Patient complains of neck and left shoulder pain that started two weeks ago after moving furniture. Patient alert, oriented, and in no apparent distress at this time.

## 2021-04-27 NOTE — ED Notes (Signed)
Pt leaving as she stated she had an appointment somewhere at 11:30 and she thought she'd be seen sooner. Moving her OTF.

## 2021-04-27 NOTE — ED Provider Notes (Signed)
[facility] Provider Note  CSN: [id number] Arrival date & time: 04/27/21 1135    History Chief Complaint  Patient presents with   Neck Pain    Lauren [name] is a 55 y.o. female here for evaluation of L neck pain, radiating down her L arm for at least 5-6 weeks. She was seen at [facility] for same and given oral prednisone, she has tried gabapentin without improvement. She has not had muscle relaxers. She is seeing Dr. Letta Pate for lumbar radiculopathy and had ESI on 6/30 which helped her sciatica for 3 days but it has since returned. Takes motrin PRN with some improvement. No falls or injuries.    Past Medical History:  Diagnosis Date   Anxiety    Arthritis    Depression    GERD (gastroesophageal reflux disease)    Hypertension    Prediabetes    Sleep apnea    Past hx - no cpap now     Past Surgical History:  Procedure Laterality Date   ABDOMINAL HYSTERECTOMY     BACK SURGERY     CESAREAN SECTION     COLONOSCOPY     HAND SURGERY     NECK SURGERY  2002   plate in her neck    Family History  Problem Relation Age of Onset   Diabetes Father    Healthy Mother    Colon cancer Neg Hx    Colon polyps Neg Hx    Esophageal cancer Neg Hx    Rectal cancer Neg Hx    Stomach cancer Neg Hx     Social History   Tobacco Use   Smoking status: Every Day    Packs/day: 0.50    Pack years: 0.00    Types: Cigarettes   Smokeless tobacco: Never   Tobacco comments:    0.5 or less   Vaping Use   Vaping Use: Never used  Substance Use Topics   Alcohol use: No   Drug use: No     Home Medications Prior to Admission medications   Medication Sig Start Date End Date Taking? Authorizing Provider  methocarbamol (ROBAXIN) 500 MG tablet Take 1 tablet (500 mg total) by mouth 2 (two) times daily. 04/27/21  Yes Truddie Hidden, MD  predniSONE (STERAPRED UNI-PAK 21 TAB) 10 MG (21) TBPK tablet 10mg  Tabs, 6 day taper. Use as directed 04/27/21  Yes Truddie Hidden, MD  albuterol (VENTOLIN HFA) 108 (90 Base) MCG/ACT inhaler Inhale 1-2 puffs into the lungs every 6 (six) hours as needed for wheezing or shortness of breath. 01/03/21   Gildardo Pounds, NP  aspirin 81 MG EC tablet TAKE 1 TABLET (81 MG TOTAL) BY MOUTH DAILY. 01/03/21 01/03/22  Gildardo Pounds, NP  atorvastatin (LIPITOR) 10 MG tablet TAKE 1 TABLET (10 MG TOTAL) BY MOUTH DAILY. 04/06/21 04/06/22  Gildardo Pounds, NP  ibuprofen (ADVIL) 600 MG tablet Take 1 tablet (600 mg total) by mouth every 8 (eight) hours as needed. 02/08/21   Gildardo Pounds, NP  lisinopril (ZESTRIL) 20 MG tablet TAKE 1 TABLET (20 MG TOTAL) BY MOUTH DAILY. 04/06/21 04/06/22  Gildardo Pounds, NP  metoprolol tartrate (LOPRESSOR) 25 MG tablet Take 0.5 tablets (12.5 mg total) by mouth 2 (two) times daily. 02/11/21 05/14/21  Gildardo Pounds, NP  Misc. Devices MISC Please provide patient with insurance approved supplies for her CPAP: tubing, chin strap and supplies and filters. 11/17/18   Gildardo Pounds, NP  Multiple Vitamin (  MULTIVITAMIN) capsule Take by mouth.    [provider]  omeprazole (PRILOSEC) 40 MG capsule TAKE 1 CAPSULE (40 MG TOTAL) BY MOUTH DAILY. 04/10/21 04/10/22  Gildardo Pounds, NP  traMADol (ULTRAM) 50 MG tablet Take 1 tablet (50 mg total) by mouth every 6 (six) hours as needed. Patient not taking: Reported on 04/19/2021 03/21/21   Charlett Blake, MD  venlafaxine XR (EFFEXOR-XR) 150 MG 24 hr capsule Take 1 capsule (150 mg total) by mouth every morning. 04/10/21   Gildardo Pounds, NP  amitriptyline (ELAVIL) 75 MG tablet Take 1 tablet (75 mg total) by mouth at bedtime. 10/06/18 07/20/20  Gildardo Pounds, NP  Cetirizine HCl 10 MG CAPS Take 1 capsule (10 mg total) by mouth daily for 10 days. 02/19/19 07/20/20  Wieters, Hallie C, PA-C  DULoxetine (CYMBALTA) 30 MG capsule Take 1 capsule (30 mg total) by mouth daily. 09/22/20 01/03/21  Gildardo Pounds, NP  fluticasone (FLONASE) 50 MCG/ACT nasal spray Place 2  sprays into both nostrils daily for 14 days. 02/10/19 07/20/20  Gildardo Pounds, NP  sucralfate (CARAFATE) 1 g tablet Take 1 tablet (1 g total) by mouth 3 (three) times daily with meals for 14 days. 03/15/19 07/20/20  Couture, Cortni S, PA-C     Allergies    Iodinated diagnostic agents   Review of Systems   Review of Systems A comprehensive review of systems was completed and negative except as noted in HPI.    Physical Exam BP 138/75 (BP Location: Right Arm)   Pulse 67   Temp 98.4 F (36.9 C)   Resp 16   Ht 5\' 2"  (1.575 m)   Wt 76.7 kg   SpO2 100%   BMI 30.91 kg/m   Physical Exam Vitals and nursing note reviewed.  HENT:     Head: Normocephalic.     Nose: Nose normal.  Eyes:     Extraocular Movements: Extraocular movements intact.  Pulmonary:     Effort: Pulmonary effort is normal.  Musculoskeletal:        General: Tenderness (L cervical paraspinal and trapezius muscles) present. Normal range of motion.     Cervical back: Neck supple.  Skin:    Findings: No rash (on exposed skin).  Neurological:     Mental Status: She is alert and oriented to person, place, and time.     Sensory: No sensory deficit.     Motor: No weakness.  Psychiatric:        Mood and Affect: Mood normal.     ED Results / Procedures / Treatments   Labs (all labs ordered are listed, but only abnormal results are displayed) Labs Reviewed - No data to display  EKG EKG Interpretation  Date/Time:  Friday April 27 2021 11:44:45 EDT Ventricular Rate:  61 PR Interval:  176 QRS Duration: 78 QT Interval:  414 QTC Calculation: 416 R Axis:   74 Text Interpretation: Sinus rhythm with marked sinus arrhythmia Nonspecific T wave abnormality Abnormal ECG Since last tracing Sinus arrhythmia has replaced Normal sinus rhythm Rate slower Confirmed by Calvert Cantor [phone]) on 04/27/2021 1:15:52 PM   Radiology No results found.  Procedures Procedures  Medications Ordered in the ED Medications   ketorolac (TORADOL) 30 MG/ML injection 30 mg (has no administration in time range)     MDM Rules/Calculators/A&P MDM Toradol IM for pain. Rx for Pred-pak, Robaxin and follow up with Dr. Letta Pate.   ED Course  I have reviewed the triage vital signs and  the nursing notes.  Pertinent labs & imaging results that were available during my care of the patient were reviewed by me and considered in my medical decision making (see chart for details).     Final Clinical Impression(s) / ED Diagnoses Final diagnoses:  Cervical radiculopathy    Rx / DC Orders ED Discharge Orders          Ordered    predniSONE (STERAPRED UNI-PAK 21 TAB) 10 MG (21) TBPK tablet        04/27/21 1334    methocarbamol (ROBAXIN) 500 MG tablet  2 times daily        04/27/21 1334             Truddie Hidden, MD 04/27/21 1334

## 2021-04-27 NOTE — ED Notes (Signed)
States helping moving furniture two weeks ago and develop neck pain that radiates down left arm.  Denies headache or chest pain.  Neck stiff turning to left.  Pain worse and shoots down left arm with neck movement to left.  Good circulation to left hand.

## 2021-04-27 NOTE — ED Triage Notes (Addendum)
Neck pain radiating down L arm x 2 weeks. States she did move furniture. Went to [facility] this am but left before being seen.

## 2021-04-30 ENCOUNTER — Telehealth: Payer: Self-pay

## 2021-04-30 ENCOUNTER — Other Ambulatory Visit: Payer: Self-pay

## 2021-04-30 ENCOUNTER — Telehealth: Payer: Self-pay | Admitting: Physical Medicine & Rehabilitation

## 2021-04-30 MED ORDER — GABAPENTIN 100 MG PO CAPS
100.0000 mg | ORAL_CAPSULE | Freq: Three times a day (TID) | ORAL | 1 refills | Status: DC
  Filled 2021-04-30: qty 90, 30d supply, fill #0

## 2021-04-30 NOTE — Telephone Encounter (Signed)
Patient calls and states pain was relieved for 4-5 days and now back is at 10+ scheduled for Repeat Left L4-5 , L5-S1. Per notes of last procedure. Patient would like to know if there is any medication she could take to ease her pain in the interim.

## 2021-04-30 NOTE — Telephone Encounter (Signed)
Lauren [name] complains of only having 3-4 days of back pain relief. After the Trans-foraminal Epidural Steroid injection on 04/19/2021.

## 2021-05-04 ENCOUNTER — Other Ambulatory Visit: Payer: Self-pay

## 2021-05-09 ENCOUNTER — Other Ambulatory Visit: Payer: Self-pay

## 2021-05-14 ENCOUNTER — Other Ambulatory Visit: Payer: Self-pay

## 2021-05-15 ENCOUNTER — Other Ambulatory Visit: Payer: Self-pay

## 2021-05-22 ENCOUNTER — Encounter: Payer: Self-pay | Admitting: Nurse Practitioner

## 2021-05-23 ENCOUNTER — Other Ambulatory Visit: Payer: Self-pay | Admitting: Physical Medicine & Rehabilitation

## 2021-05-23 ENCOUNTER — Other Ambulatory Visit: Payer: Self-pay

## 2021-05-23 MED ORDER — GABAPENTIN 300 MG PO CAPS
300.0000 mg | ORAL_CAPSULE | Freq: Three times a day (TID) | ORAL | 1 refills | Status: DC
  Filled 2021-05-23: qty 90, 30d supply, fill #0

## 2021-05-24 ENCOUNTER — Other Ambulatory Visit: Payer: Self-pay

## 2021-05-29 ENCOUNTER — Other Ambulatory Visit: Payer: Self-pay

## 2021-06-12 ENCOUNTER — Other Ambulatory Visit: Payer: Self-pay | Admitting: Nurse Practitioner

## 2021-06-12 ENCOUNTER — Other Ambulatory Visit: Payer: Self-pay

## 2021-06-12 DIAGNOSIS — I10 Essential (primary) hypertension: Secondary | ICD-10-CM

## 2021-06-12 MED ORDER — METOPROLOL TARTRATE 25 MG PO TABS
12.5000 mg | ORAL_TABLET | Freq: Two times a day (BID) | ORAL | 0 refills | Status: DC
  Filled 2021-06-12: qty 30, 30d supply, fill #0
  Filled 2021-07-12: qty 30, 30d supply, fill #1
  Filled 2021-08-11: qty 30, 30d supply, fill #2

## 2021-06-14 ENCOUNTER — Other Ambulatory Visit: Payer: Self-pay

## 2021-07-03 ENCOUNTER — Other Ambulatory Visit: Payer: Self-pay | Admitting: Nurse Practitioner

## 2021-07-03 ENCOUNTER — Other Ambulatory Visit: Payer: Self-pay

## 2021-07-03 ENCOUNTER — Encounter: Payer: Self-pay | Admitting: Physical Medicine & Rehabilitation

## 2021-07-03 DIAGNOSIS — Z1159 Encounter for screening for other viral diseases: Secondary | ICD-10-CM

## 2021-07-03 DIAGNOSIS — K219 Gastro-esophageal reflux disease without esophagitis: Secondary | ICD-10-CM

## 2021-07-03 MED ORDER — OMEPRAZOLE 40 MG PO CPDR
DELAYED_RELEASE_CAPSULE | Freq: Every day | ORAL | 2 refills | Status: DC
  Filled 2021-07-03: qty 90, fill #0
  Filled 2021-07-09: qty 30, 30d supply, fill #0

## 2021-07-09 ENCOUNTER — Other Ambulatory Visit: Payer: Self-pay

## 2021-07-10 ENCOUNTER — Other Ambulatory Visit: Payer: Self-pay

## 2021-07-12 ENCOUNTER — Other Ambulatory Visit: Payer: Self-pay

## 2021-07-12 ENCOUNTER — Other Ambulatory Visit: Payer: Self-pay | Admitting: Nurse Practitioner

## 2021-07-12 DIAGNOSIS — I10 Essential (primary) hypertension: Secondary | ICD-10-CM

## 2021-07-12 DIAGNOSIS — F339 Major depressive disorder, recurrent, unspecified: Secondary | ICD-10-CM

## 2021-07-12 MED ORDER — VENLAFAXINE HCL ER 150 MG PO CP24
150.0000 mg | ORAL_CAPSULE | Freq: Every morning | ORAL | 0 refills | Status: DC
  Filled 2021-07-12: qty 30, 30d supply, fill #0
  Filled 2021-08-11: qty 30, 30d supply, fill #1
  Filled 2021-09-10: qty 30, 30d supply, fill #2

## 2021-07-12 MED ORDER — LISINOPRIL 20 MG PO TABS
ORAL_TABLET | Freq: Every day | ORAL | 0 refills | Status: DC
  Filled 2021-07-12: qty 30, 30d supply, fill #0
  Filled 2021-08-11: qty 30, 30d supply, fill #1
  Filled 2021-09-10: qty 30, 30d supply, fill #2

## 2021-07-12 MED FILL — Aspirin Tab Delayed Release 81 MG: ORAL | Qty: 90 | Fill #0 | Status: CN

## 2021-07-13 ENCOUNTER — Ambulatory Visit: Payer: Self-pay | Attending: Nurse Practitioner | Admitting: Nurse Practitioner

## 2021-07-13 ENCOUNTER — Other Ambulatory Visit: Payer: Self-pay

## 2021-07-13 ENCOUNTER — Encounter: Payer: Self-pay | Admitting: Nurse Practitioner

## 2021-07-13 VITALS — BP 148/85 | HR 76 | Ht 62.0 in | Wt 175.4 lb

## 2021-07-13 DIAGNOSIS — F17200 Nicotine dependence, unspecified, uncomplicated: Secondary | ICD-10-CM

## 2021-07-13 DIAGNOSIS — M5416 Radiculopathy, lumbar region: Secondary | ICD-10-CM

## 2021-07-13 DIAGNOSIS — I10 Essential (primary) hypertension: Secondary | ICD-10-CM

## 2021-07-13 DIAGNOSIS — K219 Gastro-esophageal reflux disease without esophagitis: Secondary | ICD-10-CM

## 2021-07-13 DIAGNOSIS — F17210 Nicotine dependence, cigarettes, uncomplicated: Secondary | ICD-10-CM

## 2021-07-13 MED ORDER — PANTOPRAZOLE SODIUM 20 MG PO TBEC
20.0000 mg | DELAYED_RELEASE_TABLET | Freq: Two times a day (BID) | ORAL | 1 refills | Status: DC
  Filled 2021-07-13: qty 60, 30d supply, fill #0
  Filled 2021-08-11: qty 60, 30d supply, fill #1
  Filled 2021-09-10: qty 60, 30d supply, fill #2
  Filled 2021-10-08: qty 60, 30d supply, fill #3
  Filled 2021-11-07: qty 60, 30d supply, fill #0
  Filled 2021-11-07: qty 60, 30d supply, fill #4
  Filled 2021-12-09: qty 30, 15d supply, fill #1
  Filled 2022-01-07: qty 30, 15d supply, fill #2

## 2021-07-13 MED ORDER — ALBUTEROL SULFATE HFA 108 (90 BASE) MCG/ACT IN AERS
1.0000 | INHALATION_SPRAY | Freq: Four times a day (QID) | RESPIRATORY_TRACT | 1 refills | Status: DC | PRN
  Filled 2021-07-13 – 2021-12-09 (×3): qty 18, 25d supply, fill #0

## 2021-07-13 NOTE — Progress Notes (Signed)
Assessment & Plan:  Lauren [name] was seen today for hypertension.  Diagnoses and all orders for this visit:  Essential hypertension  Left lumbar radiculitis -     DG Lumbar Spine Complete; Future  Gastroesophageal reflux disease without esophagitis -     pantoprazole (PROTONIX) 20 MG tablet; Take 1 tablet (20 mg total) by mouth 2 (two) times daily.  Tobacco dependence -     albuterol (VENTOLIN HFA) 108 (90 Base) MCG/ACT inhaler; Inhale 1-2 puffs into the lungs every 6 (six) hours as needed for wheezing or shortness of breath.   Patient has been counseled on age-appropriate routine health concerns for screening and prevention. These are reviewed and up-to-date. Referrals have been placed accordingly. Immunizations are up-to-date or declined.    Subjective:   Chief Complaint  Patient presents with   Hypertension   HPI Lauren [name] 56 y.o. female presents to office today for follow up to HTN She has a past medical history of Anxiety, Arthritis, Depression, GERD  Hypertension, Prediabetes, and Sleep apnea.    HTN Not well controlled. She is taking lisinopril 20 mg daily and lopressor 12.5 mg BID. She continues to smoke cigarettes. Denies chest pain, shortness of breath, palpitations, lightheadedness, dizziness, headaches or BLE edema.   BP Readings from Last 3 Encounters:  07/13/21 (!) 148/85  04/27/21 138/75  04/27/21 (!) 148/99   GERD Not controlled with omeprazole. Will switch to pantoprazole today.   Back Pain She does not wish to return to PMR. States spinal injections did not work for her. She has a history of lumbar surgery 8 years ago. PER PMR:  I reviewed records from [place] in 2021.  X-rays did not show any instrumentation.  Surgical records from [place] are unavailable.  Spine center records from [employer] indicate patient has had microdiscectomy.Used to get cortisone shots, [facility] last performed in April 2021 at the left L4-5 and L5-S1  levels transforaminal route.  Duration of effect was 4 months.  This was helpful in alleviating left lower extremity pain. She currently denies any symptoms of bowel or bladder incontinence, progressive BLE weakness or falls. Pain is and described as burning, stabbing, and aching.  Review of Systems  Constitutional:  Negative for fever, malaise/fatigue and weight loss.  HENT: Negative.  Negative for nosebleeds.   Eyes: Negative.  Negative for blurred vision, double vision and photophobia.  Respiratory: Negative.  Negative for cough and shortness of breath.   Cardiovascular: Negative.  Negative for chest pain, palpitations and leg swelling.  Gastrointestinal:  Positive for heartburn. Negative for nausea and vomiting.  Musculoskeletal:  Positive for back pain. Negative for myalgias.  Neurological: Negative.  Negative for dizziness, focal weakness, seizures and headaches.  Psychiatric/Behavioral: Negative.  Negative for suicidal ideas.    Past Medical History:  Diagnosis Date   Anxiety    Arthritis    Depression    GERD (gastroesophageal reflux disease)    Hypertension    Prediabetes    Sleep apnea    Past hx - no cpap now     Past Surgical History:  Procedure Laterality Date   ABDOMINAL HYSTERECTOMY     BACK SURGERY     CESAREAN SECTION     COLONOSCOPY     HAND SURGERY     NECK SURGERY  2002   plate in her neck    Family History  Problem Relation Age of Onset   Diabetes Father    Healthy Mother  Colon cancer Neg Hx    Colon polyps Neg Hx    Esophageal cancer Neg Hx    Rectal cancer Neg Hx    Stomach cancer Neg Hx     Social History Reviewed with no changes to be made today.   Outpatient Medications Prior to Visit  Medication Sig Dispense Refill   aspirin 81 MG EC tablet TAKE 1 TABLET (81 MG TOTAL) BY MOUTH DAILY. 90 tablet 3   atorvastatin (LIPITOR) 10 MG tablet TAKE 1 TABLET (10 MG TOTAL) BY MOUTH DAILY. 90 tablet 1   gabapentin (NEURONTIN) 300 MG capsule Take 1  capsule (300 mg total) by mouth 3 (three) times daily. 90 capsule 1   ibuprofen (ADVIL) 600 MG tablet Take 1 tablet (600 mg total) by mouth every 8 (eight) hours as needed. 60 tablet 1   lisinopril (ZESTRIL) 20 MG tablet TAKE 1 TABLET (20 MG TOTAL) BY MOUTH DAILY. 90 tablet 0   methocarbamol (ROBAXIN) 500 MG tablet Take 1 tablet (500 mg total) by mouth 2 (two) times daily. 20 tablet 0   metoprolol tartrate (LOPRESSOR) 25 MG tablet Take 0.5 tablets (12.5 mg total) by mouth 2 (two) times daily. 90 tablet 0   Misc. Devices MISC Please provide patient with insurance approved supplies for her CPAP: tubing, chin strap and supplies and filters. 1 each 0   Multiple Vitamin (MULTIVITAMIN) capsule Take by mouth.     predniSONE (DELTASONE) 10 MG tablet Take 6 tablets on day 1, 5 tablets on day 2, 4 tablets on day 3, 3 tablets on day 4, 2 tablets on day 5, 1 tablet on day 6 21 tablet 0   traMADol (ULTRAM) 50 MG tablet Take 1 tablet (50 mg total) by mouth every 6 (six) hours as needed. 28 tablet 0   venlafaxine XR (EFFEXOR-XR) 150 MG 24 hr capsule Take 1 capsule (150 mg total) by mouth every morning. 90 capsule 0   albuterol (VENTOLIN HFA) 108 (90 Base) MCG/ACT inhaler Inhale 1-2 puffs into the lungs every 6 (six) hours as needed for wheezing or shortness of breath. 18 g 0   omeprazole (PRILOSEC) 40 MG capsule TAKE 1 CAPSULE (40 MG TOTAL) BY MOUTH DAILY. 90 capsule 2   No facility-administered medications prior to visit.    Allergies  Allergen Reactions   Iodinated Diagnostic Agents Nausea Only       Objective:    BP (!) 148/85   Pulse 76   Ht 5\' 2"  (1.575 m)   Wt 175 lb 6 oz (79.5 kg)   SpO2 100%   BMI 32.08 kg/m  Wt Readings from Last 3 Encounters:  07/13/21 175 lb 6 oz (79.5 kg)  04/27/21 169 lb (76.7 kg)  04/19/21 169 lb 3.2 oz (76.7 kg)    Physical Exam Vitals and nursing note reviewed.  Constitutional:      Appearance: She is well-developed.  HENT:     Head: Normocephalic and  atraumatic.  Cardiovascular:     Rate and Rhythm: Normal rate and regular rhythm.     Heart sounds: Normal heart sounds. No murmur heard.   No friction rub. No gallop.  Pulmonary:     Effort: Pulmonary effort is normal. No tachypnea or respiratory distress.     Breath sounds: Normal breath sounds. No decreased breath sounds, wheezing, rhonchi or rales.  Chest:     Chest wall: No tenderness.  Abdominal:     General: Bowel sounds are normal.     Palpations: Abdomen is  soft.  Musculoskeletal:        General: Normal range of motion.     Cervical back: Normal range of motion.  Skin:    General: Skin is warm and dry.  Neurological:     Mental Status: She is alert and oriented to person, place, and time.     Coordination: Coordination normal.  Psychiatric:        Behavior: Behavior normal. Behavior is cooperative.        Thought Content: Thought content normal.        Judgment: Judgment normal.         Patient has been counseled extensively about nutrition and exercise as well as the importance of adherence with medications and regular follow-up. The patient was given clear instructions to go to ER or return to medical center if symptoms don't improve, worsen or new problems develop. The patient verbalized understanding.   Follow-up: Return in about 3 months (around 10/12/2021).   Gildardo Pounds, FNP-BC [facility] and [facility] [place], [place]   07/14/2021, 9:03 PM

## 2021-07-14 ENCOUNTER — Encounter: Payer: Self-pay | Admitting: Nurse Practitioner

## 2021-07-20 ENCOUNTER — Other Ambulatory Visit: Payer: Self-pay

## 2021-07-24 ENCOUNTER — Ambulatory Visit
Admission: RE | Admit: 2021-07-24 | Discharge: 2021-07-24 | Disposition: A | Discharge status: Home / Self Care | Payer: Self-pay | Source: Ambulatory Visit | Attending: Nurse Practitioner | Admitting: Nurse Practitioner

## 2021-07-24 DIAGNOSIS — M5416 Radiculopathy, lumbar region: Secondary | ICD-10-CM

## 2021-07-25 ENCOUNTER — Other Ambulatory Visit: Payer: Self-pay | Admitting: Nurse Practitioner

## 2021-07-25 DIAGNOSIS — M5416 Radiculopathy, lumbar region: Secondary | ICD-10-CM

## 2021-08-06 ENCOUNTER — Other Ambulatory Visit: Payer: Self-pay

## 2021-08-06 ENCOUNTER — Encounter (HOSPITAL_BASED_OUTPATIENT_CLINIC_OR_DEPARTMENT_OTHER): Payer: Self-pay

## 2021-08-06 ENCOUNTER — Emergency Department (HOSPITAL_BASED_OUTPATIENT_CLINIC_OR_DEPARTMENT_OTHER)
Admission: EM | Admit: 2021-08-06 | Discharge: 2021-08-06 | Disposition: A | Discharge status: Home / Self Care | Payer: Self-pay | Attending: Emergency Medicine | Admitting: Emergency Medicine

## 2021-08-06 DIAGNOSIS — I10 Essential (primary) hypertension: Secondary | ICD-10-CM | POA: Insufficient documentation

## 2021-08-06 DIAGNOSIS — Z7982 Long term (current) use of aspirin: Secondary | ICD-10-CM | POA: Insufficient documentation

## 2021-08-06 DIAGNOSIS — F17210 Nicotine dependence, cigarettes, uncomplicated: Secondary | ICD-10-CM | POA: Insufficient documentation

## 2021-08-06 DIAGNOSIS — M545 Low back pain, unspecified: Secondary | ICD-10-CM | POA: Insufficient documentation

## 2021-08-06 DIAGNOSIS — Z79899 Other long term (current) drug therapy: Secondary | ICD-10-CM | POA: Insufficient documentation

## 2021-08-06 MED ORDER — HYDROCODONE-ACETAMINOPHEN 5-325 MG PO TABS: 1.0000 | ORAL_TABLET | ORAL | 0 refills | Status: DC | PRN

## 2021-08-06 MED ORDER — METHOCARBAMOL 500 MG PO TABS
500.0000 mg | ORAL_TABLET | Freq: Four times a day (QID) | ORAL | 0 refills | Status: DC
  Filled 2021-08-06: qty 20, 5d supply, fill #0

## 2021-08-06 NOTE — ED Triage Notes (Signed)
Pt reports waking yesterday morning with lower back pain. Pt reports a hx of sciatica pain but as never experienced this type of pain before. Pt denies problems with bowel or bladder, denies bloody urine, denies any injury

## 2021-08-06 NOTE — ED Notes (Signed)
Patient verbalizes understanding of discharge instructions. Opportunity for questioning and answers were provided. Patient discharged from ED.  °

## 2021-08-06 NOTE — Discharge Instructions (Addendum)
Return if any problems.

## 2021-08-06 NOTE — ED Provider Notes (Signed)
[facility] EMERGENCY DEPT Provider Note   CSN: [id number] Arrival date & time: 08/06/21  1239     History Chief Complaint  Patient presents with   Back Pain    Lauren [name] is a 55 y.o. female.  The history is provided by the patient. No language interpreter was used.  Back Pain Location:  Lumbar spine Quality:  Aching Radiates to:  Does not radiate Timing:  Constant Progression:  Worsening Chronicity:  New Relieved by:  Nothing Worsened by:  Nothing Ineffective treatments:  None tried Associated symptoms: no fever   Risk factors: no hx of cancer   Pt complains of a flare up of back pain.  Pt reports she is schedule to see Orthopaedist next month.     Past Medical History:  Diagnosis Date   Anxiety    Arthritis    Depression    GERD (gastroesophageal reflux disease)    Hypertension    Prediabetes    Sleep apnea    Past hx - no cpap now     Patient Active Problem List   Diagnosis Date Noted   Left lumbar radiculitis 04/19/2021   Radiculopathy of cervical spine 08/25/2018   Pain in right hand 07/28/2018   Trigger finger, right ring finger 07/28/2018   Mixed hyperlipidemia 06/19/2017   Gastroesophageal reflux disease 04/15/2017   OSA (obstructive sleep apnea) 04/15/2017   Depression 11/28/2016    Past Surgical History:  Procedure Laterality Date   ABDOMINAL HYSTERECTOMY     BACK SURGERY     CESAREAN SECTION     COLONOSCOPY     HAND SURGERY     NECK SURGERY  2002   plate in her neck     OB History     Gravida  1   Para      Term      Preterm      AB      Living  1      SAB      IAB      Ectopic      Multiple      Live Births  1           Family History  Problem Relation Age of Onset   Diabetes Father    Healthy Mother    Colon cancer Neg Hx    Colon polyps Neg Hx    Esophageal cancer Neg Hx    Rectal cancer Neg Hx    Stomach cancer Neg Hx     Social History   Tobacco Use   Smoking status:  Every Day    Packs/day: 0.50    Types: Cigarettes   Smokeless tobacco: Never   Tobacco comments:    0.5 or less   Vaping Use   Vaping Use: Never used  Substance Use Topics   Alcohol use: No   Drug use: No    Home Medications Prior to Admission medications   Medication Sig Start Date End Date Taking? Authorizing Provider  HYDROcodone-acetaminophen (NORCO/VICODIN) 5-325 MG tablet Take 1 tablet by mouth every 4 (four) hours as needed for moderate pain. 08/06/21 08/06/22 Yes Fransico Meadow, PA-C  methocarbamol (ROBAXIN) 500 MG tablet Take 1 tablet (500 mg total) by mouth 4 (four) times daily. 08/06/21  Yes Caryl Ada K, PA-C  albuterol (VENTOLIN HFA) 108 (90 Base) MCG/ACT inhaler Inhale 1-2 puffs into the lungs every 6 (six) hours as needed for wheezing or shortness of breath. 07/13/21   Gildardo Pounds,  NP  aspirin 81 MG EC tablet TAKE 1 TABLET (81 MG TOTAL) BY MOUTH DAILY. 01/03/21 01/03/22  Gildardo Pounds, NP  atorvastatin (LIPITOR) 10 MG tablet TAKE 1 TABLET (10 MG TOTAL) BY MOUTH DAILY. 04/06/21 04/06/22  Gildardo Pounds, NP  gabapentin (NEURONTIN) 300 MG capsule Take 1 capsule (300 mg total) by mouth 3 (three) times daily. 05/23/21   Kirsteins, Luanna Salk, MD  ibuprofen (ADVIL) 600 MG tablet Take 1 tablet (600 mg total) by mouth every 8 (eight) hours as needed. 02/08/21   Gildardo Pounds, NP  lisinopril (ZESTRIL) 20 MG tablet TAKE 1 TABLET (20 MG TOTAL) BY MOUTH DAILY. 07/12/21 07/12/22  Gildardo Pounds, NP  metoprolol tartrate (LOPRESSOR) 25 MG tablet Take 0.5 tablets (12.5 mg total) by mouth 2 (two) times daily. 06/12/21 09/10/21  Gildardo Pounds, NP  Misc. Devices MISC Please provide patient with insurance approved supplies for her CPAP: tubing, chin strap and supplies and filters. 11/17/18   Gildardo Pounds, NP  Multiple Vitamin (MULTIVITAMIN) capsule Take by mouth.    [provider]  pantoprazole (PROTONIX) 20 MG tablet Take 1 tablet (20 mg total) by mouth 2 (two) times  daily. 07/13/21 10/11/21  Gildardo Pounds, NP  predniSONE (DELTASONE) 10 MG tablet Take 6 tablets on day 1, 5 tablets on day 2, 4 tablets on day 3, 3 tablets on day 4, 2 tablets on day 5, 1 tablet on day 6 04/27/21   Truddie Hidden, MD  traMADol (ULTRAM) 50 MG tablet Take 1 tablet (50 mg total) by mouth every 6 (six) hours as needed. 03/21/21   Charlett Blake, MD  venlafaxine XR (EFFEXOR-XR) 150 MG 24 hr capsule Take 1 capsule (150 mg total) by mouth every morning. 07/12/21   Gildardo Pounds, NP  amitriptyline (ELAVIL) 75 MG tablet Take 1 tablet (75 mg total) by mouth at bedtime. 10/06/18 07/20/20  Gildardo Pounds, NP  Cetirizine HCl 10 MG CAPS Take 1 capsule (10 mg total) by mouth daily for 10 days. 02/19/19 07/20/20  Wieters, Hallie C, PA-C  DULoxetine (CYMBALTA) 30 MG capsule Take 1 capsule (30 mg total) by mouth daily. 09/22/20 01/03/21  Gildardo Pounds, NP  fluticasone (FLONASE) 50 MCG/ACT nasal spray Place 2 sprays into both nostrils daily for 14 days. 02/10/19 07/20/20  Gildardo Pounds, NP  sucralfate (CARAFATE) 1 g tablet Take 1 tablet (1 g total) by mouth 3 (three) times daily with meals for 14 days. 03/15/19 07/20/20  Couture, Cortni S, PA-C    Allergies    Iodinated diagnostic agents  Review of Systems   Review of Systems  Constitutional:  Negative for fever.  Musculoskeletal:  Positive for back pain.  All other systems reviewed and are negative.  Physical Exam Updated Vital Signs BP (!) 145/70 (BP Location: Right Arm)   Pulse (!) 58   Temp 98.6 F (37 C)   Resp 16   Ht 5\' 2"  (1.575 m)   Wt 79.5 kg   SpO2 100%   BMI 32.06 kg/m   Physical Exam Vitals and nursing note reviewed.  Constitutional:      Appearance: She is well-developed.  HENT:     Head: Normocephalic.  Pulmonary:     Effort: Pulmonary effort is normal.  Abdominal:     General: There is no distension.  Musculoskeletal:        General: Normal range of motion.     Cervical back: Normal range of motion.  Comments: Ender lumbar spine  pain with range of motion  Neurological:     General: No focal deficit present.     Mental Status: She is alert and oriented to person, place, and time.  Psychiatric:        Mood and Affect: Mood normal.    ED Results / Procedures / Treatments   Labs (all labs ordered are listed, but only abnormal results are displayed) Labs Reviewed - No data to display  EKG None  Radiology No results found.  Procedures Procedures   Medications Ordered in ED Medications - No data to display  ED Course  I have reviewed the triage vital signs and the nursing notes.  Pertinent labs & imaging results that were available during my care of the patient were reviewed by me and considered in my medical decision making (see chart for details).    MDM Rules/Calculators/A&P                           Pt given solumedrol and toradol.  Pt advised to follow up with primary and Orthopaedist as scheduled  Final Clinical Impression(s) / ED Diagnoses Final diagnoses:  Midline low back pain without sciatica, unspecified chronicity    Rx / DC Orders ED Discharge Orders          Ordered    HYDROcodone-acetaminophen (NORCO/VICODIN) 5-325 MG tablet  Every 4 hours PRN        08/06/21 1653    methocarbamol (ROBAXIN) 500 MG tablet  4 times daily        08/06/21 1653          An After Visit Summary was printed and given to the patient.    Tatiana, [place], Hershal Coria 08/06/21 2217    Lucrezia Starch, MD 08/08/21 [phone]

## 2021-08-07 ENCOUNTER — Other Ambulatory Visit: Payer: Self-pay

## 2021-08-10 ENCOUNTER — Encounter: Payer: Self-pay | Admitting: Nurse Practitioner

## 2021-08-13 ENCOUNTER — Other Ambulatory Visit: Payer: Self-pay

## 2021-08-22 ENCOUNTER — Ambulatory Visit (INDEPENDENT_AMBULATORY_CARE_PROVIDER_SITE_OTHER): Payer: Self-pay | Admitting: Surgery

## 2021-08-22 ENCOUNTER — Encounter: Payer: Self-pay | Admitting: Surgery

## 2021-08-22 ENCOUNTER — Other Ambulatory Visit: Payer: Self-pay

## 2021-08-22 VITALS — BP 153/83 | HR 84 | Ht 62.0 in | Wt 175.3 lb

## 2021-08-22 DIAGNOSIS — M5416 Radiculopathy, lumbar region: Secondary | ICD-10-CM

## 2021-08-22 MED ORDER — METHYLPREDNISOLONE ACETATE 80 MG/ML IJ SUSP: 80.0000 mg | Freq: Once | INTRAMUSCULAR | Status: DC

## 2021-08-22 MED ORDER — KETOROLAC TROMETHAMINE 30 MG/ML IJ SOLN: 30.0000 mg | Freq: Once | INTRAMUSCULAR | Status: AC

## 2021-08-22 NOTE — Progress Notes (Signed)
Per Benjiman Core, PA, patient was given an IM injection of Depo, 80mg  in the right gluteal and an IM injection of Toradol, 30mg  in the left gluteal.  Patient tolerated both injections well and advised to call the office with any issues.

## 2021-08-23 ENCOUNTER — Encounter: Payer: Self-pay | Admitting: Surgery

## 2021-08-23 ENCOUNTER — Telehealth: Payer: Self-pay | Admitting: Surgery

## 2021-08-23 ENCOUNTER — Ambulatory Visit
Admission: RE | Admit: 2021-08-23 | Discharge: 2021-08-23 | Disposition: A | Discharge status: Home / Self Care | Payer: Self-pay | Source: Ambulatory Visit | Attending: Surgery | Admitting: Surgery

## 2021-08-23 DIAGNOSIS — M5416 Radiculopathy, lumbar region: Secondary | ICD-10-CM

## 2021-08-23 MED ORDER — GADOBENATE DIMEGLUMINE 529 MG/ML IV SOLN
15.0000 mL | Freq: Once | INTRAVENOUS | Status: AC | PRN
  Administered 2021-08-23: 15 mL via INTRAVENOUS

## 2021-08-23 NOTE — Progress Notes (Addendum)
Office Visit Note   Patient: Lauren [name]           Date of Birth: Jan 29, 1966           MRN: [id number] Visit Date: 08/22/2021              Requested by: Gildardo Pounds, NP [place],  [place] [address] PCP: Gildardo Pounds, NP   Assessment & Plan: Visit Diagnoses:  1. Radiculopathy, lumbar region     Plan: Since patient has failed multiple modes of conservative treatment including oral medication, visits to the emergency department, lumbar ESI's recommend getting a lumbar MRI with and without contrast to rule out HNP/stenosis.  Follow with Dr. Lorin Mercy after completion to discuss results and further treatment options.  Patient asked if there was some way that she could get hopefully some relief for a couple days of her pain and she was given Depo-Medrol 80 mg and Toradol 30 mg IM injections today.  All questions answered.  Follow-Up Instructions: Return in about 3 weeks (around 09/12/2021) for with dr yates to review lumbar MRI.   Orders:  Orders Placed This Encounter  Procedures   MR Lumbar Spine W Wo Contrast   Meds ordered this encounter  Medications   methylPREDNISolone acetate (DEPO-MEDROL) injection 80 mg   ketorolac (TORADOL) 30 MG/ML injection 30 mg      Procedures: No procedures performed   Clinical Data: No additional findings.   Subjective: Chief Complaint  Patient presents with   Lower Back - Pain    HPI 55 year old black female comes in today with complaints of worsening low back pain and left lower extremity radiculopathy.  Patient states that she is status post lumbar microdiscectomy 2014 that was done in [place].  Not sure of which levels were done.  Moved down to [place] in 2017.  States that she has had ongoing pain over the last several years but this is been getting worse over the last year or so.  Last year she was seen at [facility] and had a lumbar ESI.    Last MRI scan she thinks was done in 2014 before her  surgery.  She has had a couple of visits to the emergency department most recently July 2022 and she was given prednisone at that visit.  Medication did not help.  She is also seen pain management specialist Dr. Providence Lanius and he did a lumbar ESI May 2022.  Patient states that pain is on th left side and radiates down to her left foot.  When pain is "at its worse" it extends down to the bilateral inner thighs.  No complaints of bowel or bladder incontinence.  Patient currently using ibuprofen and gabapentin prescribed by her primary care provider with no relief.  Denies right lower extremity symptoms.   Review of Systems No current cardiac pulmonary GI GU  Objective: Vital Signs: BP (!) 153/83   Pulse 84   Ht 5\' 2"  (1.575 m)   Wt 175 lb 4.3 oz (79.5 kg)   BMI 32.06 kg/m   Physical Exam HENT:     Head: Normocephalic.  Eyes:     Extraocular Movements: Extraocular movements intact.  Musculoskeletal:     Comments: Gait is antalgic.  Pain with lumbar flexion extension.  Positive lumbar paraspinal tenderness/spasm.  Marked positive left straight leg raise.  Negative on the right side.  Negative logroll bilateral hips.  No focal motor deficits.  Bilateral calves nontender.  Neurological:  Mental Status: She is alert and oriented to person, place, and time.  Psychiatric:        Mood and Affect: Mood normal.    Ortho Exam  Specialty Comments:  No specialty comments available.  Imaging: No results found.   PMFS History: Patient Active Problem List   Diagnosis Date Noted   Left lumbar radiculitis 04/19/2021   Radiculopathy of cervical spine 08/25/2018   Pain in right hand 07/28/2018   Trigger finger, right ring finger 07/28/2018   Mixed hyperlipidemia 06/19/2017   Gastroesophageal reflux disease 04/15/2017   OSA (obstructive sleep apnea) 04/15/2017   Depression 11/28/2016   Past Medical History:  Diagnosis Date   Anxiety    Arthritis    Depression    GERD (gastroesophageal  reflux disease)    Hypertension    Prediabetes    Sleep apnea    Past hx - no cpap now     Family History  Problem Relation Age of Onset   Diabetes Father    Healthy Mother    Colon cancer Neg Hx    Colon polyps Neg Hx    Esophageal cancer Neg Hx    Rectal cancer Neg Hx    Stomach cancer Neg Hx     Past Surgical History:  Procedure Laterality Date   ABDOMINAL HYSTERECTOMY     BACK SURGERY     CESAREAN SECTION     COLONOSCOPY     HAND SURGERY     NECK SURGERY  2002   plate in her neck   Social History   Occupational History   Not on file  Tobacco Use   Smoking status: Every Day    Packs/day: 0.50    Types: Cigarettes   Smokeless tobacco: Never   Tobacco comments:    0.5 or less   Vaping Use   Vaping Use: Never used  Substance and Sexual Activity   Alcohol use: No   Drug use: No   Sexual activity: Not Currently    Birth control/protection: None

## 2021-08-23 NOTE — Telephone Encounter (Signed)
Patient called advised she had her MRI today and the pain is back. Patient said the injections on lasted through yesterday and this morning. Patient said her lower back and left leg started hurting half way through her shift at work. Patient asked what can she do at this point? Patient asked for a call back as soon as possible. The number to contact patient is [phone]

## 2021-08-23 NOTE — Telephone Encounter (Signed)
Pt called. She had an appt with Jeneen Rinks yesterday and he told her to follow up with nitka after her MRI. Well she has her Mri this morning and would like to get in a lot sooner than 09/20/2021.    CB [phone]

## 2021-08-24 ENCOUNTER — Other Ambulatory Visit: Payer: Self-pay

## 2021-08-24 ENCOUNTER — Emergency Department (HOSPITAL_COMMUNITY)
Admission: EM | Admit: 2021-08-24 | Discharge: 2021-08-24 | Discharge status: Against Medical Advice | Payer: Medicaid Other

## 2021-08-24 ENCOUNTER — Telehealth: Payer: Self-pay | Admitting: Surgery

## 2021-08-24 NOTE — Telephone Encounter (Signed)
Pt coming in to see Dr. Lorin Mercy per Benjiman Core she was supposed to folow up with MY instead.

## 2021-08-24 NOTE — Telephone Encounter (Signed)
Pt called back and need to know what to do about her pains. Injections did not work. Please contact pt some time today she is asking. Pt phone number is [phone].

## 2021-09-04 ENCOUNTER — Encounter: Payer: Self-pay | Admitting: Orthopaedic Surgery

## 2021-09-04 ENCOUNTER — Ambulatory Visit (INDEPENDENT_AMBULATORY_CARE_PROVIDER_SITE_OTHER): Payer: Self-pay | Admitting: Orthopaedic Surgery

## 2021-09-04 ENCOUNTER — Other Ambulatory Visit: Payer: Self-pay

## 2021-09-04 VITALS — BP 136/77 | HR 61 | Ht 62.0 in | Wt 175.0 lb

## 2021-09-04 DIAGNOSIS — G8929 Other chronic pain: Secondary | ICD-10-CM

## 2021-09-04 DIAGNOSIS — M545 Low back pain, unspecified: Secondary | ICD-10-CM

## 2021-09-04 NOTE — Progress Notes (Signed)
Office Visit Note   Patient: Lauren [name]           Date of Birth: 16-Aug-1966           MRN: [id number] Visit Date: 09/04/2021              Requested by: Gildardo Pounds, NP [place],   [id number] PCP: Gildardo Pounds, NP   Assessment & Plan: Visit Diagnoses:  1. Chronic left-sided low back pain, unspecified whether sciatica present     Plan: Patient has postop changes from laminotomy on the left at L3-4 with minimal protrusion remaining.  Some mild narrowing at L4-5 mild bulge at L5-S1 on the right.  No areas of moderate or severe compression.  We will set her up for some physical therapy and recheck her in 3 months.  Follow-Up Instructions: Return in about 3 months (around 12/05/2021).   Orders:  Orders Placed This Encounter  Procedures   Ambulatory referral to Physical Therapy   No orders of the defined types were placed in this encounter.     Procedures: No procedures performed   Clinical Data: No additional findings.   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  She got an IM injection last visit gave her relief for 2 days.  She states she not really been sleeping well.  She denies neck pain no chills or fever.  No dyspnea.  She has had trouble with sleep apnea GI reflux and cervical radiculopathy.  Patient is continuing to work.  Review of Systems all of systems noncontributory to HPI.   Objective: Vital Signs: BP 136/77   Pulse 61   Ht 5\' 2"  (1.575 m)   Wt 175 lb (79.4 kg)   BMI 32.01 kg/m   Physical Exam Constitutional:      Appearance: She is well-developed.  HENT:     Head: Normocephalic.     Right Ear: External ear normal.     Left Ear: External ear normal. There is no impacted cerumen.  Eyes:      Pupils: Pupils are equal, round, and reactive to light.  Neck:     Thyroid: No thyromegaly.     Trachea: No tracheal deviation.  Cardiovascular:     Rate and Rhythm: Normal rate.  Pulmonary:     Effort: Pulmonary effort is normal.  Abdominal:     Palpations: Abdomen is soft.  Musculoskeletal:     Cervical back: No rigidity.  Skin:    General: Skin is warm and dry.  Neurological:     Mental Status: She is alert and oriented to person, place, and time.  Psychiatric:        Behavior: Behavior normal.    Ortho Exam well-healed lumbar incision.  Reflexes are trace and symmetrical.  Negative popliteal compression test.  Lumbar incision at the L3-4 level.  Quads are strong negative logroll of the hips.  Specialty Comments:  No specialty comments available.  Imaging: CLINICAL DATA:  Low back pain with lumbar radiculopathy. Left lower extremity pain. Prior back surgery.   EXAM: MRI LUMBAR SPINE WITHOUT AND WITH CONTRAST   TECHNIQUE: Multiplanar and multiecho pulse sequences of the lumbar spine were obtained without and with intravenous contrast.   CONTRAST:  17mL MULTIHANCE GADOBENATE DIMEGLUMINE 529 MG/ML IV SOLN   COMPARISON:  Lumbar spine radiographs 07/24/2021   FINDINGS: Segmentation:  5 lumbar segments.   Alignment:  Normal   Vertebrae:  Normal bone marrow.  Negative for fracture or mass.   Conus medullaris and cauda equina: Conus extends to the L1-2 level. Conus and cauda equina appear normal.   Paraspinal and other soft tissues: Negative for paraspinous mass or fluid collection.   Disc levels:   L1-2: Negative   L2-3: Mild disc bulging. Negative for neural impingement or stenosis.   L3-4: Left laminotomy. Shallow left paracentral disc protrusion causing mild subarticular stenosis on the left. Spinal canal adequate in diameter.   L4-5: Mild to moderate central disc protrusion indenting the thecal sac and causing mild spinal stenosis. Mild facet  hypertrophy. Mild subarticular and foraminal stenosis bilaterally   L5-S1: Disc degeneration with disc bulging. Right paracentral disc protrusion flattening the thecal sac and causing right S1 nerve root impingement. There is also mild left S1 nerve root impingement due to disc bulging and facet hypertrophy. Spinal canal adequate in size.   IMPRESSION: 1. Postop left laminotomy L3-4. Shallow left paracentral disc protrusion remains 2. Mild subarticular and foraminal stenosis at L4-5 with mild spinal stenosis 3. Disc bulging at L5-S1 with right paracentral disc protrusion. There is bilateral S1 nerve root impingement, right greater than left.     Electronically Signed   By: Franchot Gallo M.D.   On: 08/23/2021 08:58   PMFS History: Patient Active Problem List   Diagnosis Date Noted   Left lumbar radiculitis 04/19/2021   Radiculopathy of cervical spine 08/25/2018   Pain in right hand 07/28/2018   Trigger finger, right ring finger 07/28/2018   Mixed hyperlipidemia 06/19/2017   Gastroesophageal reflux disease 04/15/2017   OSA (obstructive sleep apnea) 04/15/2017   Depression 11/28/2016   Past Medical History:  Diagnosis Date   Anxiety    Arthritis    Depression    GERD (gastroesophageal reflux disease)    Hypertension    Prediabetes    Sleep apnea    Past hx - no cpap now     Family History  Problem Relation Age of Onset   Diabetes Father    Healthy Mother    Colon cancer Neg Hx    Colon polyps Neg Hx    Esophageal cancer Neg Hx    Rectal cancer Neg Hx    Stomach cancer Neg Hx     Past Surgical History:  Procedure Laterality Date   ABDOMINAL HYSTERECTOMY     BACK SURGERY     CESAREAN SECTION     COLONOSCOPY     HAND SURGERY     NECK SURGERY  2002   plate in her neck   Social History   Occupational History   Not on file  Tobacco Use   Smoking status: Every Day    Packs/day: 0.50    Types: Cigarettes   Smokeless tobacco: Never   Tobacco comments:     0.5 or less   Vaping Use   Vaping Use: Never used  Substance and Sexual Activity   Alcohol use: No   Drug use: No   Sexual activity: Not Currently    Birth control/protection: None

## 2021-09-05 ENCOUNTER — Other Ambulatory Visit: Payer: Self-pay

## 2021-09-10 ENCOUNTER — Other Ambulatory Visit: Payer: Self-pay | Admitting: Nurse Practitioner

## 2021-09-10 ENCOUNTER — Other Ambulatory Visit: Payer: Self-pay

## 2021-09-10 DIAGNOSIS — I10 Essential (primary) hypertension: Secondary | ICD-10-CM

## 2021-09-10 MED ORDER — METOPROLOL TARTRATE 25 MG PO TABS
12.5000 mg | ORAL_TABLET | Freq: Two times a day (BID) | ORAL | 0 refills | Status: DC
  Filled 2021-09-10: qty 30, 30d supply, fill #0
  Filled 2021-10-08: qty 30, 30d supply, fill #1
  Filled 2021-11-07: qty 30, 30d supply, fill #0
  Filled 2021-11-07: qty 30, 30d supply, fill #2

## 2021-09-10 NOTE — Telephone Encounter (Signed)
Requested Prescriptions  Pending Prescriptions Disp Refills  . metoprolol tartrate (LOPRESSOR) 25 MG tablet 90 tablet 0    Sig: Take 0.5 tablets (12.5 mg total) by mouth 2 (two) times daily.     Cardiovascular:  Beta Blockers Passed - 09/10/2021  7:10 AM      Passed - Last BP in normal range    BP Readings from Last 1 Encounters:  09/04/21 136/77         Passed - Last Heart Rate in normal range    Pulse Readings from Last 1 Encounters:  09/04/21 61         Passed - Valid encounter within last 6 months    Recent Outpatient Visits          1 month ago Essential hypertension   [place], Vernia Buff, NP   5 months ago Essential hypertension   [place], Vernia Buff, NP   8 months ago Essential hypertension   [place], [place] W, NP   10 months ago Chronic left-sided low back pain with left-sided sciatica   [place], [place] W, NP   1 year ago Chronic bilateral low back pain with left-sided sciatica   [place] Gildardo Pounds, NP      Future Appointments            In 1 month Gildardo Pounds, NP [facility]   In 2 months Lorin Mercy, Thana Farr, MD [facility]

## 2021-09-11 ENCOUNTER — Other Ambulatory Visit: Payer: Self-pay

## 2021-09-21 ENCOUNTER — Ambulatory Visit: Payer: Self-pay | Admitting: Specialist

## 2021-10-08 ENCOUNTER — Other Ambulatory Visit: Payer: Self-pay | Admitting: Nurse Practitioner

## 2021-10-08 ENCOUNTER — Other Ambulatory Visit: Payer: Self-pay

## 2021-10-08 DIAGNOSIS — I10 Essential (primary) hypertension: Secondary | ICD-10-CM

## 2021-10-08 DIAGNOSIS — E785 Hyperlipidemia, unspecified: Secondary | ICD-10-CM

## 2021-10-08 DIAGNOSIS — F339 Major depressive disorder, recurrent, unspecified: Secondary | ICD-10-CM

## 2021-10-08 MED ORDER — VENLAFAXINE HCL ER 150 MG PO CP24
150.0000 mg | ORAL_CAPSULE | Freq: Every morning | ORAL | 0 refills | Status: DC
  Filled 2021-10-08: qty 30, 30d supply, fill #0

## 2021-10-08 MED ORDER — ATORVASTATIN CALCIUM 10 MG PO TABS
ORAL_TABLET | Freq: Every day | ORAL | 0 refills | Status: DC
  Filled 2021-10-08 – 2021-10-09 (×2): qty 30, 30d supply, fill #0

## 2021-10-08 MED ORDER — LISINOPRIL 20 MG PO TABS
ORAL_TABLET | Freq: Every day | ORAL | 0 refills | Status: DC
  Filled 2021-10-08: qty 30, 30d supply, fill #0

## 2021-10-09 ENCOUNTER — Encounter (HOSPITAL_BASED_OUTPATIENT_CLINIC_OR_DEPARTMENT_OTHER): Payer: Self-pay

## 2021-10-09 ENCOUNTER — Emergency Department (HOSPITAL_BASED_OUTPATIENT_CLINIC_OR_DEPARTMENT_OTHER)
Admission: EM | Admit: 2021-10-09 | Discharge: 2021-10-09 | Disposition: A | Discharge status: Home / Self Care | Payer: Medicaid Other | Attending: Emergency Medicine | Admitting: Emergency Medicine

## 2021-10-09 ENCOUNTER — Other Ambulatory Visit: Payer: Self-pay

## 2021-10-09 DIAGNOSIS — R252 Cramp and spasm: Secondary | ICD-10-CM

## 2021-10-09 DIAGNOSIS — M545 Low back pain, unspecified: Secondary | ICD-10-CM | POA: Insufficient documentation

## 2021-10-09 DIAGNOSIS — G8929 Other chronic pain: Secondary | ICD-10-CM | POA: Insufficient documentation

## 2021-10-09 DIAGNOSIS — R064 Hyperventilation: Secondary | ICD-10-CM | POA: Insufficient documentation

## 2021-10-09 DIAGNOSIS — M791 Myalgia, unspecified site: Secondary | ICD-10-CM | POA: Insufficient documentation

## 2021-10-09 DIAGNOSIS — I10 Essential (primary) hypertension: Secondary | ICD-10-CM | POA: Insufficient documentation

## 2021-10-09 DIAGNOSIS — F17210 Nicotine dependence, cigarettes, uncomplicated: Secondary | ICD-10-CM | POA: Insufficient documentation

## 2021-10-09 DIAGNOSIS — Z79899 Other long term (current) drug therapy: Secondary | ICD-10-CM | POA: Insufficient documentation

## 2021-10-09 DIAGNOSIS — Z7982 Long term (current) use of aspirin: Secondary | ICD-10-CM | POA: Insufficient documentation

## 2021-10-09 NOTE — ED Provider Notes (Signed)
[facility] EMERGENCY DEPT Provider Note   CSN: [id number] Arrival date & time: 10/09/21  0143     History Chief Complaint  Patient presents with   Spasms    Lauren [name] is a 55 y.o. female.  55 yo F with a chief complaints of allover spasms.  This happened while she was trying to go to bed tonight.  She tells me that she took melatonin and had laid down and was having trouble sleeping.  She has chronic left-sided low back pain due to a bulging disc at L1.  She felt she developed pain to both of her legs that was severe and then she developed all of her cramping and felt like her hands both went into claws.  This eventually got better.  She denies any symptoms other than her chronic pain to her left leg.  The history is provided by the patient and a relative.  Illness Severity:  Moderate Onset quality:  Gradual Duration:  20 minutes Timing:  Rare Progression:  Resolved Chronicity:  New Associated symptoms: myalgias   Associated symptoms: no chest pain, no congestion, no fever, no headaches, no nausea, no rhinorrhea, no shortness of breath, no vomiting and no wheezing       Past Medical History:  Diagnosis Date   Anxiety    Arthritis    Depression    GERD (gastroesophageal reflux disease)    Hypertension    Prediabetes    Sleep apnea    Past hx - no cpap now     Patient Active Problem List   Diagnosis Date Noted   Left lumbar radiculitis 04/19/2021   Radiculopathy of cervical spine 08/25/2018   Pain in right hand 07/28/2018   Trigger finger, right ring finger 07/28/2018   Mixed hyperlipidemia 06/19/2017   Gastroesophageal reflux disease 04/15/2017   OSA (obstructive sleep apnea) 04/15/2017   Depression 11/28/2016    Past Surgical History:  Procedure Laterality Date   ABDOMINAL HYSTERECTOMY     BACK SURGERY     CESAREAN SECTION     COLONOSCOPY     HAND SURGERY     NECK SURGERY  2002   plate in her neck     OB History     Gravida   1   Para      Term      Preterm      AB      Living  1      SAB      IAB      Ectopic      Multiple      Live Births  1           Family History  Problem Relation Age of Onset   Diabetes Father    Healthy Mother    Colon cancer Neg Hx    Colon polyps Neg Hx    Esophageal cancer Neg Hx    Rectal cancer Neg Hx    Stomach cancer Neg Hx     Social History   Tobacco Use   Smoking status: Every Day    Packs/day: 0.50    Types: Cigarettes   Smokeless tobacco: Never   Tobacco comments:    0.5 or less   Vaping Use   Vaping Use: Never used  Substance Use Topics   Alcohol use: No   Drug use: No    Home Medications Prior to Admission medications   Medication Sig Start Date End Date Taking? Authorizing Provider  albuterol (  VENTOLIN HFA) 108 (90 Base) MCG/ACT inhaler Inhale 1-2 puffs into the lungs every 6 (six) hours as needed for wheezing or shortness of breath. 07/13/21   Gildardo Pounds, NP  aspirin 81 MG EC tablet TAKE 1 TABLET (81 MG TOTAL) BY MOUTH DAILY. 01/03/21 01/03/22  Gildardo Pounds, NP  atorvastatin (LIPITOR) 10 MG tablet TAKE 1 TABLET (10 MG TOTAL) BY MOUTH DAILY. 10/08/21 10/08/22  Charlott Rakes, MD  ibuprofen (ADVIL) 600 MG tablet Take 1 tablet (600 mg total) by mouth every 8 (eight) hours as needed. 02/08/21   Gildardo Pounds, NP  lisinopril (ZESTRIL) 20 MG tablet TAKE 1 TABLET (20 MG TOTAL) BY MOUTH DAILY. 10/08/21 10/08/22  Charlott Rakes, MD  metoprolol tartrate (LOPRESSOR) 25 MG tablet Take 0.5 tablets (12.5 mg total) by mouth 2 (two) times daily. 09/10/21 12/09/21  Gildardo Pounds, NP  Misc. Devices MISC Please provide patient with insurance approved supplies for her CPAP: tubing, chin strap and supplies and filters. 11/17/18   Gildardo Pounds, NP  Multiple Vitamin (MULTIVITAMIN) capsule Take by mouth.    [provider]  pantoprazole (PROTONIX) 20 MG tablet Take 1 tablet (20 mg total) by mouth 2 (two) times daily. 07/13/21  11/10/21  Gildardo Pounds, NP  traMADol (ULTRAM) 50 MG tablet Take 1 tablet (50 mg total) by mouth every 6 (six) hours as needed. 03/21/21   Kirsteins, Luanna Salk, MD  venlafaxine XR (EFFEXOR-XR) 150 MG 24 hr capsule Take 1 capsule (150 mg total) by mouth every morning. 10/08/21   Charlott Rakes, MD  amitriptyline (ELAVIL) 75 MG tablet Take 1 tablet (75 mg total) by mouth at bedtime. 10/06/18 07/20/20  Gildardo Pounds, NP  Cetirizine HCl 10 MG CAPS Take 1 capsule (10 mg total) by mouth daily for 10 days. 02/19/19 07/20/20  Wieters, Hallie C, PA-C  DULoxetine (CYMBALTA) 30 MG capsule Take 1 capsule (30 mg total) by mouth daily. 09/22/20 01/03/21  Gildardo Pounds, NP  fluticasone (FLONASE) 50 MCG/ACT nasal spray Place 2 sprays into both nostrils daily for 14 days. 02/10/19 07/20/20  Gildardo Pounds, NP  sucralfate (CARAFATE) 1 g tablet Take 1 tablet (1 g total) by mouth 3 (three) times daily with meals for 14 days. 03/15/19 07/20/20  Couture, Cortni S, PA-C    Allergies    Iodinated diagnostic agents  Review of Systems   Review of Systems  Constitutional:  Negative for chills and fever.  HENT:  Negative for congestion and rhinorrhea.   Eyes:  Negative for redness and visual disturbance.  Respiratory:  Negative for shortness of breath and wheezing.   Cardiovascular:  Negative for chest pain and palpitations.  Gastrointestinal:  Negative for nausea and vomiting.  Genitourinary:  Negative for dysuria and urgency.  Musculoskeletal:  Positive for myalgias. Negative for arthralgias.  Skin:  Negative for pallor and wound.  Neurological:  Negative for dizziness and headaches.   Physical Exam Updated Vital Signs BP (!) 156/90 (BP Location: Left Arm)    Pulse 95    Temp 97.8 F (36.6 C) (Oral)    Ht 5\' 2"  (1.575 m)    Wt 72.6 kg    SpO2 99%    BMI 29.26 kg/m   Physical Exam Vitals and nursing note reviewed.  Constitutional:      General: She is not in acute distress.    Appearance: She is  well-developed. She is not diaphoretic.  HENT:     Head: Normocephalic and atraumatic.  Eyes:  Pupils: Pupils are equal, round, and reactive to light.  Cardiovascular:     Rate and Rhythm: Normal rate and regular rhythm.     Heart sounds: No murmur heard.   No friction rub. No gallop.  Pulmonary:     Effort: Pulmonary effort is normal.     Breath sounds: No wheezing or rales.  Abdominal:     General: There is no distension.     Palpations: Abdomen is soft.     Tenderness: There is no abdominal tenderness.  Musculoskeletal:        General: No tenderness.     Cervical back: Normal range of motion and neck supple.  Skin:    General: Skin is warm and dry.  Neurological:     Mental Status: She is alert and oriented to person, place, and time.  Psychiatric:        Behavior: Behavior normal.    ED Results / Procedures / Treatments   Labs (all labs ordered are listed, but only abnormal results are displayed) Labs Reviewed - No data to display  EKG None  Radiology No results found.  Procedures Procedures   Medications Ordered in ED Medications - No data to display  ED Course  I have reviewed the triage vital signs and the nursing notes.  Pertinent labs & imaging results that were available during my care of the patient were reviewed by me and considered in my medical decision making (see chart for details).    MDM Rules/Calculators/A&P                         55 yo F with a chief complaints of diffuse muscle cramping.  When I was discussing with her the possible etiologies of her symptoms I mentioned hyperventilation as a possible cause and she looked at her family member with her eyes wide and they said all that is what happened.  She was truly breathing quickly and she was unable to slow that down and then developed the all over cramping.  She is now better.  I feel that a laboratory evaluation would likely be unhelpful.  We will have her follow-up with her family  doctor in the office.  3:18 AM:  I have discussed the diagnosis/risks/treatment options with the patient and family and believe the pt to be eligible for discharge home to follow-up with PCP. We also discussed returning to the ED immediately if new or worsening sx occur. We discussed the sx which are most concerning (e.g., sudden worsening pain, fever, inability to tolerate by mouth) that necessitate immediate return. Medications administered to the patient during their visit and any new prescriptions provided to the patient are listed below.  Medications given during this visit Medications - No data to display   The patient appears reasonably screen and/or stabilized for discharge and I doubt any other medical condition or other [facility] requiring further screening, evaluation, or treatment in the ED at this time prior to discharge.      Final Clinical Impression(s) / ED Diagnoses Final diagnoses:  Muscle cramping  Acute hyperventilation    Rx / DC Orders ED Discharge Orders     None        Deno Etienne, DO 10/09/21 [id number]

## 2021-10-09 NOTE — ED Notes (Signed)
Pt verbalizes understanding of discharge instructions. Opportunity for questioning and answers were provided. Pt discharged from ED to home.   ? ?

## 2021-10-09 NOTE — Discharge Instructions (Addendum)
Follow up with your family doc. Return for persistent symptoms.

## 2021-10-09 NOTE — ED Triage Notes (Signed)
Patient states she is having muscle spasms all over her body, to the point it feels like she can't breathe. Patient became really concerned when it was in the left side chest and back area.  Patient states spasms are worse when laying down.

## 2021-10-12 ENCOUNTER — Other Ambulatory Visit: Payer: Self-pay

## 2021-10-17 ENCOUNTER — Ambulatory Visit: Payer: Medicaid Other | Admitting: Nurse Practitioner

## 2021-10-29 ENCOUNTER — Other Ambulatory Visit: Payer: Self-pay

## 2021-10-29 ENCOUNTER — Ambulatory Visit: Payer: Managed Care, Other (non HMO) | Attending: Orthopaedic Surgery

## 2021-10-29 DIAGNOSIS — M545 Low back pain, unspecified: Secondary | ICD-10-CM | POA: Diagnosis not present

## 2021-10-29 DIAGNOSIS — G8929 Other chronic pain: Secondary | ICD-10-CM | POA: Diagnosis present

## 2021-10-29 DIAGNOSIS — M6281 Muscle weakness (generalized): Secondary | ICD-10-CM | POA: Diagnosis present

## 2021-10-29 NOTE — Therapy (Signed)
OUTPATIENT PHYSICAL THERAPY THORACOLUMBAR EVALUATION   Patient Name: Lauren [name] MRN: [id number] DOB:1966/01/01, 56 y.o., female Today's Date: 10/29/2021   PT End of Session - 10/29/21 0920     Visit Number 1    Number of Visits 8    Date for PT Re-Evaluation 11/26/21    Authorization Type [employer]    PT Start Time 0920    PT Stop Time 1000    PT Time Calculation (min) 40 min    Activity Tolerance Patient tolerated treatment well    Behavior During Therapy WFL for tasks assessed/performed             Past Medical History:  Diagnosis Date   Anxiety    Arthritis    Depression    GERD (gastroesophageal reflux disease)    Hypertension    Prediabetes    Sleep apnea    Past hx - no cpap now    Past Surgical History:  Procedure Laterality Date   ABDOMINAL HYSTERECTOMY     BACK SURGERY     CESAREAN SECTION     COLONOSCOPY     HAND SURGERY     NECK SURGERY  2002   plate in her neck   Patient Active Problem List   Diagnosis Date Noted   Left lumbar radiculitis 04/19/2021   Radiculopathy of cervical spine 08/25/2018   Pain in right hand 07/28/2018   Trigger finger, right ring finger 07/28/2018   Mixed hyperlipidemia 06/19/2017   Gastroesophageal reflux disease 04/15/2017   OSA (obstructive sleep apnea) 04/15/2017   Depression 11/28/2016    PCP: Gildardo Pounds, NP  REFERRING PROVIDER: Marybelle Killings, MD  REFERRING DIAG: M54.50,G89.29 (ICD-10-CM) - Chronic left-sided low back pain, unspecified whether sciatica present   THERAPY DIAG:  Chronic midline low back pain without sciatica  Muscle weakness (generalized)  ONSET DATE: 09/04/2021   SUBJECTIVE:                                                                                                                                                                                           SUBJECTIVE STATEMENT: Describes a history of L sided low back pain which radiates to LLE to ankle PERTINENT HISTORY:  Visit  Diagnoses:  1. Chronic left-sided low back pain, unspecified whether sciatica present       Plan: Patient has postop changes from laminotomy on the left at L3-4 with minimal protrusion remaining.  Some mild narrowing at L4-5 mild bulge at L5-S1 on the right.  No areas of moderate or severe compression.  We will set her up for some physical therapy and recheck her in  3 months.   Follow-Up Instructions: Return in about 3 months (around 12/05/2021).     PAIN:  Are you having pain? Yes VAS scale: 6/10 Pain location: L low back Pain orientation: Left  PAIN TYPE: aching, burning, and throbbing Pain description: intermittent  Aggravating factors: weight bearing Relieving factors: position changes  PRECAUTIONS: None  WEIGHT BEARING RESTRICTIONS No  FALLS:  Has patient fallen in last 6 months? No, Number of falls: 0  LIVING ENVIRONMENT: Lives with: lives with their family and lives with their spouse Lives in: House/apartment  OCCUPATION: Materials engineer  PLOF: Independent  PATIENT GOALS: To reduce and manage my pain   OBJECTIVE:   DIAGNOSTIC FINDINGS:  Visit Diagnoses:  1. Chronic left-sided low back pain, unspecified whether sciatica present       Plan: Patient has postop changes from laminotomy on the left at L3-4 with minimal protrusion remaining.  Some mild narrowing at L4-5 mild bulge at L5-S1 on the right.  No areas of moderate or severe compression.  We will set her up for some physical therapy and recheck her in 3 months.   Follow-Up Instructions: Return in about 3 months (around 12/05/2021).     PATIENT SURVEYS:  FOTO n/a  SCREENING FOR RED FLAGS: Bowel or bladder incontinence: No  COGNITION:  Overall cognitive status: Within functional limits for tasks assessed     SENSATION:  Light touch: Appears intact  MUSCLE LENGTH: Thomas test: Right neg; Left positive for symptoms  POSTURE:  Rounded shoulders  PALPATION: Tender to L piriformis 3/5 in  intensity  LUMBARAROM/PROM  A/PROM A/PROM  10/29/2021  Flexion WFL  Extension WFL  Right lateral flexion WFL  Left lateral flexion WFL  Right rotation   Left rotation    (Blank rows = not tested)  LE AROM/PROM:  A/PROM Right 10/29/2021 Left 10/29/2021  Hip flexion    Hip extension    Hip abduction    Hip adduction    Hip internal rotation    Hip external rotation    Knee flexion    Knee extension    Ankle dorsiflexion    Ankle plantarflexion    Ankle inversion    Ankle eversion     (Blank rows = not tested)  LE MMT:  MMT Right 10/29/2021 Left 10/29/2021  Hip flexion    Hip extension    Hip abduction    Hip adduction    Hip internal rotation    Hip external rotation    Knee flexion    Knee extension    Ankle dorsiflexion    Ankle plantarflexion    Ankle inversion    Ankle eversion     (Blank rows = not tested)  LUMBAR SPECIAL TESTS: positive L SLR, L piriformis test and L slump test for symptom reproduction  FUNCTIONAL TESTS:  5 times sit to stand: 17s  GAIT: Distance walked: 200 Assistive device utilized: None Level of assistance: Complete Independence   TODAY'S TREATMENT  HEP and eval   PATIENT EDUCATION:  Education details: Discussed eval findings, rehab rationale and POC and patient is in agreement  Person educated: Patient Education method: Explanation, Demonstration, and Handouts Education comprehension: verbalized understanding, returned demonstration, and needs further education   HOME EXERCISE PROGRAM: Access Code: [email or web address] URL: [email or web address] Date: 10/29/2021 Prepared by: Sharlynn Oliphant  Exercises Supine Piriformis Stretch with Foot on Ground - 2 x daily - 7 x weekly - 1 sets - 3 reps - 30s hold Hip Flexor Stretch at [facility] of Bed -  2 x daily - 7 x weekly - 1 sets - 3 reps - 30s hold   ASSESSMENT:  CLINICAL IMPRESSION: Patient is a 56 y.o. female who was seen today for physical therapy evaluation and treatment  for L low back and LLE pain stemming from L piriformis irritation and spasm. Objective impairments include Abnormal gait, decreased activity tolerance, decreased endurance, decreased mobility, decreased ROM, increased muscle spasms, impaired flexibility, and pain. These impairments are limiting patient from cleaning, community activity, driving, and occupation. Personal factors including Age, Fitness, Profession, and Time since onset of injury/illness/exacerbation are also affecting patient's functional outcome. Patient will benefit from skilled PT to address above impairments and improve overall function.  REHAB POTENTIAL: Good  CLINICAL DECISION MAKING: Stable/uncomplicated  EVALUATION COMPLEXITY: Low   GOALS: Goals reviewed with patient? Yes  SHORT TERM GOALS:  STG Name Target Date Goal status  1 Patient to demonstrate independence in HEP  Baseline: Access Code: [email or web address] 11/12/2021 INITIAL  2 Decrease L piriformis tenderness to 1/5 Baseline: 3/5 11/12/2021 INITIAL   LONG TERM GOALS:   LTG Name Target Date Goal status  1 Negative piriformis stretch Baseline: 11/26/2021 INITIAL  2 Decrease 5x STS to 15s or less Baseline: 17s 11/26/2021 INITIAL  3 No pain with SLR, slump test and piriformis test on  Baseline: Symptom reproduction with L SLR, piriformis and slump tests 11/26/2021 INITIAL   PLAN: PT FREQUENCY: 2x/week  PT DURATION: 4 weeks  PLANNED INTERVENTIONS: Therapeutic exercises, Therapeutic activity, Neuro Muscular re-education, Balance training, Gait training, Patient/Family education, Joint mobilization, and Manual therapy  PLAN FOR NEXT SESSION: HEP f/u, L piriformis TP release, L hip flexor stretch, assess SI alignment   Jacqulynn Cadet Davonte Siebenaler PT 10/29/2021, 9:21 AM

## 2021-11-05 ENCOUNTER — Emergency Department (HOSPITAL_BASED_OUTPATIENT_CLINIC_OR_DEPARTMENT_OTHER): Payer: Managed Care, Other (non HMO) | Admitting: Radiology

## 2021-11-05 ENCOUNTER — Encounter (HOSPITAL_BASED_OUTPATIENT_CLINIC_OR_DEPARTMENT_OTHER): Payer: Self-pay

## 2021-11-05 ENCOUNTER — Emergency Department (HOSPITAL_BASED_OUTPATIENT_CLINIC_OR_DEPARTMENT_OTHER)
Admission: EM | Admit: 2021-11-05 | Discharge: 2021-11-05 | Disposition: A | Discharge status: Home / Self Care | Payer: Managed Care, Other (non HMO) | Attending: Emergency Medicine | Admitting: Emergency Medicine

## 2021-11-05 ENCOUNTER — Other Ambulatory Visit: Payer: Self-pay

## 2021-11-05 DIAGNOSIS — R519 Headache, unspecified: Secondary | ICD-10-CM | POA: Diagnosis present

## 2021-11-05 DIAGNOSIS — Z7982 Long term (current) use of aspirin: Secondary | ICD-10-CM | POA: Diagnosis not present

## 2021-11-05 DIAGNOSIS — Z79899 Other long term (current) drug therapy: Secondary | ICD-10-CM | POA: Insufficient documentation

## 2021-11-05 DIAGNOSIS — R0789 Other chest pain: Secondary | ICD-10-CM | POA: Insufficient documentation

## 2021-11-05 DIAGNOSIS — G43809 Other migraine, not intractable, without status migrainosus: Secondary | ICD-10-CM

## 2021-11-05 LAB — CBC
HCT: 40.9 % (ref 36.0–46.0)
Hemoglobin: 13.1 g/dL (ref 12.0–15.0)
MCH: 28.4 pg (ref 26.0–34.0)
MCHC: 32 g/dL (ref 30.0–36.0)
MCV: 88.7 fL (ref 80.0–100.0)
Platelets: 389 10*3/uL (ref 150–400)
RBC: 4.61 MIL/uL (ref 3.87–5.11)
RDW: 14.8 % (ref 11.5–15.5)
WBC: 8.7 10*3/uL (ref 4.0–10.5)
nRBC: 0 % (ref 0.0–0.2)

## 2021-11-05 LAB — BASIC METABOLIC PANEL
Anion gap: 11 (ref 5–15)
BUN: 13 mg/dL (ref 6–20)
CO2: 23 mmol/L (ref 22–32)
Calcium: 9.8 mg/dL (ref 8.9–10.3)
Chloride: 105 mmol/L (ref 98–111)
Creatinine, Ser: 0.69 mg/dL (ref 0.44–1.00)
GFR, Estimated: >60 mL/min (ref >60)
Glucose, Bld: 108 mg/dL — ABNORMAL HIGH (ref 70–99)
Potassium: 4.2 mmol/L (ref 3.5–5.1)
Sodium: 139 mmol/L (ref 135–145)

## 2021-11-05 LAB — TROPONIN I (HIGH SENSITIVITY): Troponin I (High Sensitivity): <2 ng/L (ref <18)

## 2021-11-05 NOTE — ED Provider Notes (Signed)
[facility] Provider Note   CSN: [id number] Arrival date & time: 11/05/21  1328     History  Chief Complaint  Patient presents with   Headache   Chest Pain    Lauren [name] is a 56 y.o. female.  56 year old female presents with left-sided headache when she got this morning.  Described as a throbbing sensation.  No eye pain or visual changes.  No photophobia.  No neck discomfort.  States that she took aspirin and caffeine and does feel better.  Has had some chest pinching sensation lasting for few seconds and not associated with other ACS symptoms such as diaphoresis dizziness or dyspnea.  That has since resolved on its own.  No focal neurological weakness appreciated.      Home Medications Prior to Admission medications   Medication Sig Start Date End Date Taking? Authorizing Provider  albuterol (VENTOLIN HFA) 108 (90 Base) MCG/ACT inhaler Inhale 1-2 puffs into the lungs every 6 (six) hours as needed for wheezing or shortness of breath. 07/13/21   Gildardo Pounds, NP  aspirin 81 MG EC tablet TAKE 1 TABLET (81 MG TOTAL) BY MOUTH DAILY. 01/03/21 01/03/22  Gildardo Pounds, NP  atorvastatin (LIPITOR) 10 MG tablet TAKE 1 TABLET (10 MG TOTAL) BY MOUTH DAILY. 10/08/21 10/08/22  Charlott Rakes, MD  ibuprofen (ADVIL) 600 MG tablet Take 1 tablet (600 mg total) by mouth every 8 (eight) hours as needed. 02/08/21   Gildardo Pounds, NP  lisinopril (ZESTRIL) 20 MG tablet TAKE 1 TABLET (20 MG TOTAL) BY MOUTH DAILY. 10/08/21 10/08/22  Charlott Rakes, MD  metoprolol tartrate (LOPRESSOR) 25 MG tablet Take 0.5 tablets (12.5 mg total) by mouth 2 (two) times daily. 09/10/21 12/09/21  Gildardo Pounds, NP  Misc. Devices MISC Please provide patient with insurance approved supplies for her CPAP: tubing, chin strap and supplies and filters. 11/17/18   Gildardo Pounds, NP  Multiple Vitamin (MULTIVITAMIN) capsule Take by mouth.    [provider]  pantoprazole  (PROTONIX) 20 MG tablet Take 1 tablet (20 mg total) by mouth 2 (two) times daily. 07/13/21 11/11/21  Gildardo Pounds, NP  traMADol (ULTRAM) 50 MG tablet Take 1 tablet (50 mg total) by mouth every 6 (six) hours as needed. 03/21/21   Kirsteins, Luanna Salk, MD  venlafaxine XR (EFFEXOR-XR) 150 MG 24 hr capsule Take 1 capsule (150 mg total) by mouth every morning. 10/08/21   Charlott Rakes, MD  amitriptyline (ELAVIL) 75 MG tablet Take 1 tablet (75 mg total) by mouth at bedtime. 10/06/18 07/20/20  Gildardo Pounds, NP  Cetirizine HCl 10 MG CAPS Take 1 capsule (10 mg total) by mouth daily for 10 days. 02/19/19 07/20/20  Wieters, Hallie C, PA-C  DULoxetine (CYMBALTA) 30 MG capsule Take 1 capsule (30 mg total) by mouth daily. 09/22/20 01/03/21  Gildardo Pounds, NP  fluticasone (FLONASE) 50 MCG/ACT nasal spray Place 2 sprays into both nostrils daily for 14 days. 02/10/19 07/20/20  Gildardo Pounds, NP  sucralfate (CARAFATE) 1 g tablet Take 1 tablet (1 g total) by mouth 3 (three) times daily with meals for 14 days. 03/15/19 07/20/20  Couture, Cortni S, PA-C      Allergies    Iodinated contrast media    Review of Systems   Review of Systems  All other systems reviewed and are negative.  Physical Exam Updated Vital Signs BP 137/70 (BP Location: Right Arm)    Pulse 60    Temp 98 F (  36.7 C) (Oral)    Resp 15    Ht 1.575 m (5\' 2" )    Wt 72.6 kg    SpO2 96%    BMI 29.27 kg/m  Physical Exam Vitals and nursing note reviewed.  Constitutional:      General: She is not in acute distress.    Appearance: Normal appearance. She is well-developed. She is not toxic-appearing.  HENT:     Head: Normocephalic and atraumatic.  Eyes:     General: Lids are normal.     Conjunctiva/sclera: Conjunctivae normal.     Pupils: Pupils are equal, round, and reactive to light.  Neck:     Thyroid: No thyroid mass.     Trachea: No tracheal deviation.  Cardiovascular:     Rate and Rhythm: Normal rate and regular rhythm.     Heart  sounds: Normal heart sounds. No murmur heard.   No gallop.  Pulmonary:     Effort: Pulmonary effort is normal. No respiratory distress.     Breath sounds: Normal breath sounds. No stridor. No decreased breath sounds, wheezing, rhonchi or rales.  Abdominal:     General: There is no distension.     Palpations: Abdomen is soft.     Tenderness: There is no abdominal tenderness. There is no rebound.  Musculoskeletal:        General: No tenderness. Normal range of motion.     Cervical back: Normal range of motion and neck supple.  Skin:    General: Skin is warm and dry.     Findings: No abrasion or rash.  Neurological:     Mental Status: She is alert and oriented to person, place, and time. Mental status is at baseline.     GCS: GCS eye subscore is 4. GCS verbal subscore is 5. GCS motor subscore is 6.     Cranial Nerves: No cranial nerve deficit.     Sensory: No sensory deficit.     Motor: Motor function is intact.  Psychiatric:        Attention and Perception: Attention normal.        Speech: Speech normal.        Behavior: Behavior normal.    ED Results / Procedures / Treatments   Labs (all labs ordered are listed, but only abnormal results are displayed) Labs Reviewed  CBC  BASIC METABOLIC PANEL  TROPONIN I (HIGH SENSITIVITY)    EKG EKG Interpretation  Date/Time:  Monday November 05 2021 13:38:52 EST Ventricular Rate:  61 PR Interval:  160 QRS Duration: 77 QT Interval:  423 QTC Calculation: 427 R Axis:   77 Text Interpretation: Sinus arrhythmia Abnormal R-wave progression, early transition Confirmed by Lacretia Leigh ([id number]) on 11/05/2021 2:16:40 PM  Radiology No results found.  Procedures Procedures    Medications Ordered in ED Medications - No data to display  ED Course/ Medical Decision Making/ A&P                           Medical Decision Making  Patient is EKG without ischemic changes here.  Her chest discomfort is very atypical.  Troponin negative  here.  Headache has improved.  No concern for intra cerebral hemorrhage.  Suspect migraine.  No suspicion for meningitis or ocular pathology.  Migraine.  Will discharge home        Final Clinical Impression(s) / ED Diagnoses Final diagnoses:  None    Rx / DC Orders ED Discharge Orders  None         Lacretia Leigh, MD 11/05/21 [phone]

## 2021-11-05 NOTE — ED Triage Notes (Signed)
Patient here POV from Home with Headache and Chest Tightness.  Patient states she was awakened at approximately 0700 this AM with a Headache. Patient also endorses Intermittent Tightness/Pain to left Side of Face and Mid-Chest.  No Fevers. No SOB. No N/V/D.  NAD Noted during Triage. A&Ox4. GCS 15. Ambulatory.

## 2021-11-07 ENCOUNTER — Telehealth: Payer: Self-pay | Admitting: Family Medicine

## 2021-11-07 ENCOUNTER — Other Ambulatory Visit: Payer: Self-pay

## 2021-11-07 ENCOUNTER — Ambulatory Visit: Payer: Managed Care, Other (non HMO)

## 2021-11-07 DIAGNOSIS — F339 Major depressive disorder, recurrent, unspecified: Secondary | ICD-10-CM

## 2021-11-07 DIAGNOSIS — E785 Hyperlipidemia, unspecified: Secondary | ICD-10-CM

## 2021-11-07 DIAGNOSIS — I10 Essential (primary) hypertension: Secondary | ICD-10-CM

## 2021-11-07 MED ORDER — VENLAFAXINE HCL ER 150 MG PO CP24
150.0000 mg | ORAL_CAPSULE | Freq: Every morning | ORAL | 2 refills | Status: DC
  Filled 2021-11-07: qty 30, 30d supply, fill #0
  Filled 2021-12-09: qty 30, 30d supply, fill #1
  Filled 2022-01-07: qty 30, 30d supply, fill #2

## 2021-11-07 MED ORDER — LISINOPRIL 20 MG PO TABS
ORAL_TABLET | Freq: Every day | ORAL | 2 refills | Status: DC
  Filled 2021-11-07: qty 30, 30d supply, fill #0
  Filled 2021-12-09: qty 30, 30d supply, fill #1
  Filled 2022-01-07: qty 30, 30d supply, fill #2

## 2021-11-07 MED ORDER — ATORVASTATIN CALCIUM 10 MG PO TABS
ORAL_TABLET | Freq: Every day | ORAL | 2 refills | Status: DC
  Filled 2021-11-07: qty 30, 30d supply, fill #0
  Filled 2021-12-09: qty 30, 30d supply, fill #1
  Filled 2022-01-07: qty 30, 30d supply, fill #2

## 2021-11-07 NOTE — Telephone Encounter (Signed)
Patient called, left VM to return the call to the office to schedule an OV for follow up. She is due for a yearly physical.

## 2021-11-07 NOTE — Telephone Encounter (Signed)
Requested medication (s) are due for refill today: Yes  Requested medication (s) are on the active medication list: Yes  Last refill:  10/08/21  Future visit scheduled: No  Notes to clinic:  Unable to refill per protocol due to failed labs, no updated results, appointment needed, last refill 30 day supply.      Requested Prescriptions  Pending Prescriptions Disp Refills   atorvastatin (LIPITOR) 10 MG tablet 30 tablet 0    Sig: TAKE 1 TABLET (10 MG TOTAL) BY MOUTH DAILY.     Cardiovascular:  Antilipid - Statins Failed - 11/07/2021  9:16 AM      Failed - Total Cholesterol in normal range and within 360 days    Cholesterol, Total  Date Value Ref Range Status  10/31/2020 177 100 - 199 mg/dL Final          Failed - LDL in normal range and within 360 days    LDL Chol Calc (NIH)  Date Value Ref Range Status  10/31/2020 106 (H) 0 - 99 mg/dL Final          Failed - HDL in normal range and within 360 days    HDL  Date Value Ref Range Status  10/31/2020 57 >39 mg/dL Final          Failed - Triglycerides in normal range and within 360 days    Triglycerides  Date Value Ref Range Status  10/31/2020 76 0 - 149 mg/dL Final          Passed - Patient is not pregnant      Passed - Valid encounter within last 12 months    Recent Outpatient Visits           3 months ago Essential hypertension   [place], Vernia Buff, NP   7 months ago Essential hypertension   [place], Vernia Buff, NP   10 months ago Essential hypertension   [place], [place] W, NP   1 year ago Chronic left-sided low back pain with left-sided sciatica   [place], [place] W, NP   1 year ago Chronic bilateral low back pain with left-sided sciatica   [place] Gildardo Pounds, NP       Future Appointments             In 1  month Marybelle Killings, MD [facility]   In 1 month Gildardo Pounds, NP [place]             lisinopril (ZESTRIL) 20 MG tablet 30 tablet 0    Sig: TAKE 1 TABLET (20 MG TOTAL) BY MOUTH DAILY.     Cardiovascular:  ACE Inhibitors Passed - 11/07/2021  9:16 AM      Passed - Cr in normal range and within 180 days    Creatinine, Ser  Date Value Ref Range Status  11/05/2021 0.69 0.44 - 1.00 mg/dL Final   Creatinine, POC  Date Value Ref Range Status  04/15/2017 100 mg/dL Final          Passed - K in normal range and within 180 days    Potassium  Date Value Ref Range Status  11/05/2021 4.2 3.5 - 5.1 mmol/L Final          Passed - Patient is not pregnant  Passed - Last BP in normal range    BP Readings from Last 1 Encounters:  11/05/21 132/74          Passed - Valid encounter within last 6 months    Recent Outpatient Visits           3 months ago Essential hypertension   [place], Vernia Buff, NP   7 months ago Essential hypertension   [place], Vernia Buff, NP   10 months ago Essential hypertension   [place], [place] W, NP   1 year ago Chronic left-sided low back pain with left-sided sciatica   [place], [place] W, NP   1 year ago Chronic bilateral low back pain with left-sided sciatica   [place] Gildardo Pounds, NP       Future Appointments             In 1 month Marybelle Killings, MD [facility]   In 1 month Gildardo Pounds, NP [place]             venlafaxine XR (EFFEXOR-XR) 150 MG 24 hr capsule 30 capsule 0    Sig: Take 1 capsule (150 mg total) by mouth every morning.     Psychiatry: Antidepressants - SNRI - desvenlafaxine & venlafaxine Failed - 11/07/2021  9:16 AM       Failed - LDL in normal range and within 360 days    LDL Chol Calc (NIH)  Date Value Ref Range Status  10/31/2020 106 (H) 0 - 99 mg/dL Final          Failed - Total Cholesterol in normal range and within 360 days    Cholesterol, Total  Date Value Ref Range Status  10/31/2020 177 100 - 199 mg/dL Final          Failed - Triglycerides in normal range and within 360 days    Triglycerides  Date Value Ref Range Status  10/31/2020 76 0 - 149 mg/dL Final          Passed - Completed PHQ-2 or PHQ-9 in the last 360 days      Passed - Last BP in normal range    BP Readings from Last 1 Encounters:  11/05/21 132/74          Passed - Valid encounter within last 6 months    Recent Outpatient Visits           3 months ago Essential hypertension   [place], Vernia Buff, NP   7 months ago Essential hypertension   [place], Vernia Buff, NP   10 months ago Essential hypertension   [place], [place] W, NP   1 year ago Chronic left-sided low back pain with left-sided sciatica   [place], [place] W, NP   1 year ago Chronic bilateral low back pain with left-sided sciatica   [place] Gildardo Pounds, NP       Future Appointments             In 1 month [place], Thana Farr, MD [facility]   In 1 month Gildardo Pounds, NP [place]

## 2021-11-07 NOTE — Telephone Encounter (Signed)
Rxs were sent to our pharmacy today. Pt called and informed.

## 2021-11-07 NOTE — Telephone Encounter (Signed)
Pt called in stating she has appt set up, and wishes to get a refill to hold her until 12/17/21 appt, please advise.

## 2021-11-09 ENCOUNTER — Other Ambulatory Visit: Payer: Self-pay

## 2021-11-09 ENCOUNTER — Ambulatory Visit: Payer: Managed Care, Other (non HMO)

## 2021-11-09 DIAGNOSIS — M545 Low back pain, unspecified: Secondary | ICD-10-CM | POA: Diagnosis not present

## 2021-11-09 DIAGNOSIS — M6281 Muscle weakness (generalized): Secondary | ICD-10-CM

## 2021-11-09 DIAGNOSIS — G8929 Other chronic pain: Secondary | ICD-10-CM

## 2021-11-09 NOTE — Therapy (Addendum)
OUTPATIENT PHYSICAL THERAPY TREATMENT NOTE/DC Summary   Patient Name: Lauren [name] MRN: [id number] DOB:August 02, 1966, 56 y.o., female Today's Date: 11/09/2021  PCP: Gildardo Pounds, NP REFERRING PROVIDER: Marybelle Killings, MD PHYSICAL THERAPY DISCHARGE SUMMARY  Visits from Start of Care: 2  Current functional level related to goals / functional outcomes: UTA   Remaining deficits: UTA   Education / Equipment: HEP   Patient agrees to discharge. Patient goals were partially met. Patient is being discharged due to not returning since the last visit.   PT End of Session - 11/09/21 1211     Visit Number 2    Number of Visits 8    Date for PT Re-Evaluation 11/26/21    Authorization Type [employer]    PT Start Time 1215    PT Stop Time 1300    PT Time Calculation (min) 45 min    Activity Tolerance Patient tolerated treatment well    Behavior During Therapy WFL for tasks assessed/performed             Past Medical History:  Diagnosis Date   Anxiety    Arthritis    Depression    GERD (gastroesophageal reflux disease)    Hypertension    Prediabetes    Sleep apnea    Past hx - no cpap now    Past Surgical History:  Procedure Laterality Date   ABDOMINAL HYSTERECTOMY     BACK SURGERY     CESAREAN SECTION     COLONOSCOPY     HAND SURGERY     NECK SURGERY  2002   plate in her neck   Patient Active Problem List   Diagnosis Date Noted   Left lumbar radiculitis 04/19/2021   Radiculopathy of cervical spine 08/25/2018   Pain in right hand 07/28/2018   Trigger finger, right ring finger 07/28/2018   Mixed hyperlipidemia 06/19/2017   Gastroesophageal reflux disease 04/15/2017   OSA (obstructive sleep apnea) 04/15/2017   Depression 11/28/2016    REFERRING DIAG: M54.50,G89.29 (ICD-10-CM) - Chronic left-sided low back pain, unspecified whether sciatica present   THERAPY DIAG:  Chronic midline low back pain without sciatica  Muscle weakness (generalized)  PERTINENT  HISTORY: Visit Diagnoses:  1. Chronic left-sided low back pain, unspecified whether sciatica present       Plan: Patient has postop changes from laminotomy on the left at L3-4 with minimal protrusion remaining.  Some mild narrowing at L4-5 mild bulge at L5-S1 on the right.  No areas of moderate or severe compression.  We will set her up for some physical therapy and recheck her in 3 months.    Follow-Up Instructions: Return in about 3 months (around 12/05/2021).     PRECAUTIONS: none  SUBJECTIVE: no pain today, pain only when at work with prolonged standing  PAIN:  Are you having pain? No NPRS scale: 0/10 Pain location: L SI Pain orientation: Left  PAIN TYPE: aching and burning Pain description: intermittent  Aggravating factors: prolonged standing Relieving factors: position change     OBJECTIVE:    DIAGNOSTIC FINDINGS:  Visit Diagnoses:  1. Chronic left-sided low back pain, unspecified whether sciatica present       Plan: Patient has postop changes from laminotomy on the left at L3-4 with minimal protrusion remaining.  Some mild narrowing at L4-5 mild bulge at L5-S1 on the right.  No areas of moderate or severe compression.  We will set her up for some physical therapy and recheck her in 3 months.  Follow-Up Instructions: Return in about 3 months (around 12/05/2021).      PATIENT SURVEYS:  FOTO n/a   SCREENING FOR RED FLAGS: Bowel or bladder incontinence: No   COGNITION:          Overall cognitive status: Within functional limits for tasks assessed                        SENSATION:          Light touch: Appears intact   MUSCLE LENGTH: Thomas test: Right neg; Left positive for symptoms   POSTURE:  Rounded shoulders   PALPATION: Tender to L piriformis 3/5 in intensity   LUMBARAROM/PROM   A/PROM A/PROM  10/29/2021  Flexion WFL  Extension WFL  Right lateral flexion WFL  Left lateral flexion WFL  Right rotation    Left rotation     (Blank rows = not  tested)   LE AROM/PROM:   A/PROM Right 10/29/2021 Left 10/29/2021  Hip flexion      Hip extension      Hip abduction      Hip adduction      Hip internal rotation      Hip external rotation      Knee flexion      Knee extension      Ankle dorsiflexion      Ankle plantarflexion      Ankle inversion      Ankle eversion       (Blank rows = not tested)   LE MMT:   MMT Right 10/29/2021 Left 10/29/2021  Hip flexion      Hip extension      Hip abduction      Hip adduction      Hip internal rotation      Hip external rotation      Knee flexion      Knee extension      Ankle dorsiflexion      Ankle plantarflexion      Ankle inversion      Ankle eversion       (Blank rows = not tested)   LUMBAR SPECIAL TESTS: positive L SLR, L piriformis test and L slump test for symptom reproduction   FUNCTIONAL TESTS:  5 times sit to stand: 17s   GAIT: Distance walked: 200 Assistive device utilized: None Level of assistance: Complete Independence     TODAY'S TREATMENT   [facility] Adult PT Treatment:                                                DATE: 11/09/21 Therapeutic Exercise: Nustep seat 6 arms 8 L4 L sciatic nerve floss L hip flexor stretch 30s x3 QL stertch B 30s x3 Manual Therapy: PA mobs to L5-2 Manual assist with stretches       PATIENT EDUCATION:  Education details: Discussed eval findings, rehab rationale and POC and patient is in agreement  Person educated: Patient Education method: Explanation, Demonstration, and Handouts Education comprehension: verbalized understanding, returned demonstration, and needs further education     HOME EXERCISE PROGRAM: Access Code: [email or web address] URL: [email or web address] Date: 11/09/2021 Prepared by: Sharlynn Oliphant  Exercises Hip Flexor Stretch at [facility] of Bed - 2 x daily - 7 x weekly - 1 sets - 3 reps - 30s hold Supine Quadratus Lumborum Stretch - 2 x daily -  7 x weekly - 1 sets - 3 reps - 30s hold Curl Up with Arms  Crossed - 2 x daily - 7 x weekly - 2 sets - 10 reps - 30s hold      ASSESSMENT:   CLINICAL IMPRESSION: Pain and discomfort improving, stretching has been helpful and pain now only with prolonged standing, SI joints cleared of dysfunction  REHAB POTENTIAL: Good   CLINICAL DECISION MAKING: Stable/uncomplicated   EVALUATION COMPLEXITY: Low     GOALS: Goals reviewed with patient? Yes   SHORT TERM GOALS:   STG Name Target Date Goal status  1 Patient to demonstrate independence in HEP  Baseline: Access Code: [email or web address] 11/12/2021 INITIAL  2 Decrease L piriformis tenderness to 1/5 Baseline: 3/5 11/12/2021 INITIAL    LONG TERM GOALS:    LTG Name Target Date Goal status  1 Negative piriformis stretch Baseline: 11/26/2021 INITIAL  2 Decrease 5x STS to 15s or less Baseline: 17s 11/26/2021 INITIAL  3 No pain with SLR, slump test and piriformis test on  Baseline: Symptom reproduction with L SLR, piriformis and slump tests 11/26/2021 INITIAL    PLAN: PT FREQUENCY: 2x/week   PT DURATION: 4 weeks   PLANNED INTERVENTIONS: Therapeutic exercises, Therapeutic activity, Neuro Muscular re-education, Balance training, Gait training, Patient/Family education, Joint mobilization, and Manual therapy   PLAN FOR NEXT SESSION: L hip flexor stretch, core strength, manual as needed to lumbar spine      Lanice Shirts PT 11/09/2021, 12:25 PM

## 2021-11-12 ENCOUNTER — Ambulatory Visit: Payer: Managed Care, Other (non HMO)

## 2021-11-14 ENCOUNTER — Ambulatory Visit: Payer: Managed Care, Other (non HMO)

## 2021-11-19 ENCOUNTER — Ambulatory Visit: Payer: Managed Care, Other (non HMO)

## 2021-11-21 ENCOUNTER — Ambulatory Visit: Payer: Managed Care, Other (non HMO) | Attending: Orthopaedic Surgery

## 2021-11-21 ENCOUNTER — Telehealth: Payer: Self-pay

## 2021-11-21 NOTE — Telephone Encounter (Signed)
TC regarding missed visit, VM left reminding patient of next scheduled appointment.

## 2021-11-26 ENCOUNTER — Ambulatory Visit: Payer: Managed Care, Other (non HMO)

## 2021-11-28 ENCOUNTER — Ambulatory Visit: Payer: Managed Care, Other (non HMO)

## 2021-12-05 ENCOUNTER — Ambulatory Visit: Payer: Medicaid Other | Admitting: Orthopaedic Surgery

## 2021-12-09 ENCOUNTER — Other Ambulatory Visit: Payer: Self-pay | Admitting: Nurse Practitioner

## 2021-12-09 DIAGNOSIS — I10 Essential (primary) hypertension: Secondary | ICD-10-CM

## 2021-12-10 ENCOUNTER — Other Ambulatory Visit: Payer: Self-pay

## 2021-12-10 MED ORDER — METOPROLOL TARTRATE 25 MG PO TABS
12.5000 mg | ORAL_TABLET | Freq: Two times a day (BID) | ORAL | 0 refills | Status: DC
  Filled 2021-12-10: qty 30, 30d supply, fill #0

## 2021-12-11 ENCOUNTER — Ambulatory Visit: Payer: Managed Care, Other (non HMO) | Admitting: Orthopaedic Surgery

## 2021-12-17 ENCOUNTER — Other Ambulatory Visit: Payer: Self-pay

## 2021-12-17 ENCOUNTER — Ambulatory Visit: Payer: Managed Care, Other (non HMO) | Attending: Nurse Practitioner | Admitting: Nurse Practitioner

## 2021-12-17 ENCOUNTER — Encounter: Payer: Self-pay | Admitting: Nurse Practitioner

## 2021-12-17 ENCOUNTER — Other Ambulatory Visit: Payer: Self-pay | Admitting: Pharmacist

## 2021-12-17 VITALS — BP 137/74 | HR 60 | Resp 18 | Ht 62.0 in | Wt 174.1 lb

## 2021-12-17 DIAGNOSIS — I10 Essential (primary) hypertension: Secondary | ICD-10-CM | POA: Diagnosis not present

## 2021-12-17 DIAGNOSIS — F419 Anxiety disorder, unspecified: Secondary | ICD-10-CM | POA: Diagnosis not present

## 2021-12-17 DIAGNOSIS — F32A Depression, unspecified: Secondary | ICD-10-CM | POA: Diagnosis not present

## 2021-12-17 DIAGNOSIS — F17210 Nicotine dependence, cigarettes, uncomplicated: Secondary | ICD-10-CM

## 2021-12-17 DIAGNOSIS — E785 Hyperlipidemia, unspecified: Secondary | ICD-10-CM

## 2021-12-17 DIAGNOSIS — F339 Major depressive disorder, recurrent, unspecified: Secondary | ICD-10-CM

## 2021-12-17 DIAGNOSIS — F17200 Nicotine dependence, unspecified, uncomplicated: Secondary | ICD-10-CM

## 2021-12-17 DIAGNOSIS — R7303 Prediabetes: Secondary | ICD-10-CM

## 2021-12-17 LAB — POCT GLYCOSYLATED HEMOGLOBIN (HGB A1C): Hemoglobin A1C: 6 % — AB (ref 4.0–5.6)

## 2021-12-17 LAB — GLUCOSE, POCT (MANUAL RESULT ENTRY): POC Glucose: 139 mg/dl — AB (ref 70–99)

## 2021-12-17 MED ORDER — BUSPIRONE HCL 15 MG PO TABS
7.5000 mg | ORAL_TABLET | Freq: Two times a day (BID) | ORAL | 3 refills | Status: AC
  Filled 2021-12-17: qty 60, 30d supply, fill #0
  Filled 2022-01-07: qty 60, 30d supply, fill #1

## 2021-12-17 MED ORDER — ALBUTEROL SULFATE HFA 108 (90 BASE) MCG/ACT IN AERS
1.0000 | INHALATION_SPRAY | Freq: Four times a day (QID) | RESPIRATORY_TRACT | 2 refills | Status: DC | PRN
Start: 2021-12-17 — End: 2021-12-17
  Filled 2021-12-17: qty 18, 25d supply, fill #0

## 2021-12-17 MED ORDER — ALBUTEROL SULFATE HFA 108 (90 BASE) MCG/ACT IN AERS
1.0000 | INHALATION_SPRAY | Freq: Four times a day (QID) | RESPIRATORY_TRACT | 2 refills | Status: AC | PRN
  Filled 2021-12-17 – 2022-01-29 (×2): qty 8.5, 25d supply, fill #0

## 2021-12-17 NOTE — Progress Notes (Signed)
Assessment & Plan:  Kyran was seen today for prediabetes.  Diagnoses and all orders for this visit:  Prediabetes -     POCT glycosylated hemoglobin (Hb A1C) -     POCT glucose (manual entry)  Essential hypertension Continue all antihypertensives as prescribed.  Remember to bring in your blood pressure log with you for your follow up appointment.  DASH/Mediterranean Diets are healthier choices for HTN.    Anxiety and depression -     Ambulatory referral to [facility] -     busPIRone (BUSPAR) 15 MG tablet; Take 0.5-1 tablets (7.5-15 mg total) by mouth 2 (two) times daily.  Tobacco dependence -     Discontinue: albuterol (VENTOLIN HFA) 108 (90 Base) MCG/ACT inhaler; Inhale 1-2 puffs into the lungs every 6 (six) hours as needed for wheezing or shortness of breath.  Dyslipidemia, goal LDL below 70 -     Lipid panel    Patient has been counseled on age-appropriate routine health concerns for screening and prevention. These are reviewed and up-to-date. Referrals have been placed accordingly. Immunizations are up-to-date or declined.    Subjective:   Chief Complaint  Patient presents with   Prediabetes   HPI Alessandria Henken 56 y.o. female presents to office today for follow up to prediabetes and HTN She has a past medical history of Anxiety, Arthritis, Depression, GERD,  Hypertension, Prediabetes, and Sleep apnea.   Needs to follow up with Dr. Lorin Mercy. States physical therapy was not helping to relieve her back pain.    Prediabetes Well controlled without the use of any oral diabetic medications.   Lab Results  Component Value Date   HGBA1C 6.0 (A) 12/17/2021    HTN Well controlled with lisinopril 20 mg daily and metoprolol 12.5 mg BID.  BP Readings from Last 3 Encounters:  12/17/21 137/74  11/05/21 132/74  10/09/21 (!) 134/94    Anxiety and Depression Currently taking effexor 150 mg. Would like to take an anxiolytic for breakthrough anxiety. Will  try buspar today.  Depression screen [facility] 2/9 12/17/2021 07/13/2021 04/19/2021  Decreased Interest 3 0 0  Down, Depressed, Hopeless 3 0 0  PHQ - 2 Score 6 0 0  Altered sleeping 3 0 -  Tired, decreased energy 3 0 -  Change in appetite 3 0 -  Feeling bad or failure about yourself  0 0 -  Trouble concentrating 0 0 -  Moving slowly or fidgety/restless 0 0 -  Suicidal thoughts 0 0 -  PHQ-9 Score 15 0 -  Difficult doing work/chores Somewhat difficult Not difficult at all -  Some recent data might be hidden    GAD 7 : Generalized Anxiety Score 07/13/2021 04/06/2021 01/03/2021 10/31/2020  Nervous, Anxious, on Edge 0 0 0 0  Control/stop worrying 0 0 0 0  Worry too much - different things 0 0 0 0  Trouble relaxing 0 0 0 0  Restless 0 0 0 1  Easily annoyed or irritable 0 0 0 1  Afraid - awful might happen 0 0 0 0  Total GAD 7 Score 0 0 0 2  Anxiety Difficulty Not difficult at all - - -      Review of Systems  Constitutional:  Negative for fever, malaise/fatigue and weight loss.  HENT: Negative.  Negative for nosebleeds.   Eyes: Negative.  Negative for blurred vision, double vision and photophobia.  Respiratory: Negative.  Negative for cough and shortness of breath.   Cardiovascular: Negative.  Negative for chest  pain, palpitations and leg swelling.  Gastrointestinal: Negative.  Negative for heartburn, nausea and vomiting.  Musculoskeletal: Negative.  Negative for myalgias.  Neurological: Negative.  Negative for dizziness, focal weakness, seizures and headaches.  Psychiatric/Behavioral:  Positive for depression. Negative for suicidal ideas. The patient is nervous/anxious.    Past Medical History:  Diagnosis Date   Anxiety    Arthritis    Depression    GERD (gastroesophageal reflux disease)    Hypertension    Prediabetes    Sleep apnea    Past hx - no cpap now     Past Surgical History:  Procedure Laterality Date   ABDOMINAL HYSTERECTOMY     BACK SURGERY     CESAREAN SECTION      COLONOSCOPY     HAND SURGERY     NECK SURGERY  2002   plate in her neck    Family History  Problem Relation Age of Onset   Diabetes Father    Healthy Mother    Colon cancer Neg Hx    Colon polyps Neg Hx    Esophageal cancer Neg Hx    Rectal cancer Neg Hx    Stomach cancer Neg Hx     Social History Reviewed with no changes to be made today.   Outpatient Medications Prior to Visit  Medication Sig Dispense Refill   aspirin 81 MG EC tablet TAKE 1 TABLET (81 MG TOTAL) BY MOUTH DAILY. 90 tablet 3   atorvastatin (LIPITOR) 10 MG tablet TAKE 1 TABLET (10 MG TOTAL) BY MOUTH DAILY. 30 tablet 2   lisinopril (ZESTRIL) 20 MG tablet TAKE 1 TABLET (20 MG TOTAL) BY MOUTH DAILY. 30 tablet 2   metoprolol tartrate (LOPRESSOR) 25 MG tablet Take 0.5 tablets (12.5 mg total) by mouth 2 (two) times daily. 30 tablet 0   Multiple Vitamin (MULTIVITAMIN) capsule Take by mouth.     pantoprazole (PROTONIX) 20 MG tablet Take 1 tablet (20 mg total) by mouth 2 (two) times daily. 180 tablet 1   venlafaxine XR (EFFEXOR-XR) 150 MG 24 hr capsule Take 1 capsule (150 mg total) by mouth every morning. 30 capsule 2   albuterol (VENTOLIN HFA) 108 (90 Base) MCG/ACT inhaler Inhale 1-2 puffs into the lungs every 6 (six) hours as needed for wheezing or shortness of breath. 18 g 1   ibuprofen (ADVIL) 600 MG tablet Take 1 tablet (600 mg total) by mouth every 8 (eight) hours as needed. (Patient not taking: Reported on 12/17/2021) 60 tablet 1   Misc. Devices MISC Please provide patient with insurance approved supplies for her CPAP: tubing, chin strap and supplies and filters. (Patient not taking: Reported on 12/17/2021) 1 each 0   traMADol (ULTRAM) 50 MG tablet Take 1 tablet (50 mg total) by mouth every 6 (six) hours as needed. (Patient not taking: Reported on 12/17/2021) 28 tablet 0   methylPREDNISolone acetate (DEPO-MEDROL) injection 80 mg      No facility-administered medications prior to visit.    Allergies  Allergen Reactions    Iodinated Contrast Media Nausea Only       Objective:    BP 137/74    Pulse 60    Resp 18    Ht 5\' 2"  (1.575 m)    Wt 174 lb 2 oz (79 kg)    SpO2 98%    BMI 31.85 kg/m  Wt Readings from Last 3 Encounters:  12/17/21 174 lb 2 oz (79 kg)  11/05/21 160 lb 0.9 oz (72.6 kg)  10/09/21 160  lb (72.6 kg)    Physical Exam Vitals and nursing note reviewed.  Constitutional:      Appearance: She is well-developed.  HENT:     Head: Normocephalic and atraumatic.  Cardiovascular:     Rate and Rhythm: Normal rate and regular rhythm.     Heart sounds: Normal heart sounds. No murmur heard.   No friction rub. No gallop.  Pulmonary:     Effort: Pulmonary effort is normal. No tachypnea or respiratory distress.     Breath sounds: Normal breath sounds. No decreased breath sounds, wheezing, rhonchi or rales.  Chest:     Chest wall: No tenderness.  Abdominal:     General: Bowel sounds are normal.     Palpations: Abdomen is soft.  Musculoskeletal:        General: Normal range of motion.     Cervical back: Normal range of motion.  Skin:    General: Skin is warm and dry.  Neurological:     Mental Status: She is alert and oriented to person, place, and time.     Coordination: Coordination normal.  Psychiatric:        Behavior: Behavior normal. Behavior is cooperative.        Thought Content: Thought content normal.        Judgment: Judgment normal.         Patient has been counseled extensively about nutrition and exercise as well as the importance of adherence with medications and regular follow-up. The patient was given clear instructions to go to ER or return to medical center if symptoms don't improve, worsen or new problems develop. The patient verbalized understanding.   Follow-up: Return for virtual on tuesday 3 weeks for anxiety and depression. see me in 3 months.   Gildardo Pounds, FNP-BC [facility] [place], [place]   12/23/2021,  7:49 PM

## 2021-12-19 ENCOUNTER — Other Ambulatory Visit: Payer: Self-pay

## 2021-12-21 ENCOUNTER — Other Ambulatory Visit: Payer: Self-pay

## 2021-12-23 ENCOUNTER — Encounter: Payer: Self-pay | Admitting: Nurse Practitioner

## 2022-01-07 ENCOUNTER — Other Ambulatory Visit: Payer: Self-pay

## 2022-01-07 ENCOUNTER — Encounter: Payer: Self-pay | Admitting: Nurse Practitioner

## 2022-01-07 ENCOUNTER — Other Ambulatory Visit: Payer: Self-pay | Admitting: Family Medicine

## 2022-01-07 DIAGNOSIS — I10 Essential (primary) hypertension: Secondary | ICD-10-CM

## 2022-01-08 ENCOUNTER — Encounter: Payer: Self-pay | Admitting: Nurse Practitioner

## 2022-01-08 ENCOUNTER — Ambulatory Visit: Payer: Managed Care, Other (non HMO) | Attending: Nurse Practitioner | Admitting: Nurse Practitioner

## 2022-01-08 ENCOUNTER — Other Ambulatory Visit: Payer: Self-pay

## 2022-01-08 DIAGNOSIS — F418 Other specified anxiety disorders: Secondary | ICD-10-CM

## 2022-01-08 DIAGNOSIS — I10 Essential (primary) hypertension: Secondary | ICD-10-CM | POA: Diagnosis not present

## 2022-01-08 DIAGNOSIS — F5105 Insomnia due to other mental disorder: Secondary | ICD-10-CM | POA: Diagnosis not present

## 2022-01-08 MED ORDER — METOPROLOL TARTRATE 25 MG PO TABS
12.5000 mg | ORAL_TABLET | Freq: Two times a day (BID) | ORAL | 1 refills | Status: DC
Start: 2022-01-08 — End: 2022-03-21
  Filled 2022-01-08: qty 30, 30d supply, fill #0
  Filled 2022-02-01: qty 30, 30d supply, fill #1

## 2022-01-08 MED ORDER — TRAZODONE HCL 100 MG PO TABS
50.0000 mg | ORAL_TABLET | Freq: Every day | ORAL | 1 refills | Status: DC
  Filled 2022-01-08: qty 30, 30d supply, fill #0
  Filled 2022-02-01: qty 30, 30d supply, fill #1

## 2022-01-08 NOTE — Progress Notes (Signed)
Virtual Visit via Telephone Note ?Due to national recommendations of social distancing due to [place] 19, telehealth visit is felt to be most appropriate for this patient at this time.  I discussed the limitations, risks, security and privacy concerns of performing an evaluation and management service by telephone and the availability of in person appointments. I also discussed with the patient that there may be a patient responsible charge related to this service. The patient expressed understanding and agreed to proceed.  ? ? ?I connected with Lauren [name] on 01/08/22  at   9:30 AM EDT  EDT by telephone and verified that I am speaking with the correct person using two identifiers. ? ?Location of Patient: ?Private Residence ?  ?Location of Provider: ?Scientist, research (physical sciences) and [employer] Office  ?  ?Persons participating in Telemedicine visit: ?[name] FNP-BC ?Lauren [name]  ?  ?History of Present Illness: ?Telemedicine visit for: Anxiety ? ?She is currently taking busar 7.5 mg daily for anxiety which was prescribed on 12-17-2021  and reports considerable improvement in anxiety symptoms. Will continue current dose of buspar at this time.  ?She is also requesting a medication for insomnia. Will try her on trazodone at this time. She is also taking effexor xr 150 mg daily.  ? ? ? ? ? ?Past Medical History:  ?Diagnosis Date  ? Anxiety   ? Arthritis   ? Depression   ? GERD (gastroesophageal reflux disease)   ? Hypertension   ? Prediabetes   ? Sleep apnea   ? Past hx - no cpap now   ?  ?Past Surgical History:  ?Procedure Laterality Date  ? ABDOMINAL HYSTERECTOMY    ? BACK SURGERY    ? CESAREAN SECTION    ? COLONOSCOPY    ? HAND SURGERY    ? NECK SURGERY  2002  ? plate in her neck  ?  ?Family History  ?Problem Relation Age of Onset  ? Diabetes Father   ? Healthy Mother   ? Colon cancer Neg Hx   ? Colon polyps Neg Hx   ? Esophageal cancer Neg Hx   ? Rectal cancer Neg Hx   ? Stomach cancer Neg Hx   ?  ?Social  History  ? ?Socioeconomic History  ? Marital status: Single  ?  Spouse name: Not on file  ? Number of children: Not on file  ? Years of education: Not on file  ? Highest education level: Not on file  ?Occupational History  ? Not on file  ?Tobacco Use  ? Smoking status: Every Day  ?  Packs/day: 0.50  ?  Types: Cigarettes  ? Smokeless tobacco: Never  ? Tobacco comments:  ?  0.5 or less   ?Vaping Use  ? Vaping Use: Never used  ?Substance and Sexual Activity  ? Alcohol use: No  ? Drug use: No  ? Sexual activity: Not Currently  ?  Birth control/protection: None  ?Other Topics Concern  ? Not on file  ?Social History Narrative  ? Not on file  ? ?Social Determinants of Health  ? ?Financial Resource Strain: Not on file  ?Food Insecurity: Not on file  ?Transportation Needs: Not on file  ?Physical Activity: Not on file  ?Stress: Not on file  ?Social Connections: Not on file  ?  ? ?Observations/Objective: ?Awake, alert and oriented x 3 ? ? ?Review of Systems  ?Constitutional:  Negative for fever, malaise/fatigue and weight loss.  ?HENT: Negative.  Negative for nosebleeds.   ?Eyes:  Negative.  Negative for blurred vision, double vision and photophobia.  ?Respiratory: Negative.  Negative for cough and shortness of breath.   ?Cardiovascular: Negative.  Negative for chest pain, palpitations and leg swelling.  ?Gastrointestinal: Negative.  Negative for heartburn, nausea and vomiting.  ?Musculoskeletal: Negative.  Negative for myalgias.  ?Neurological: Negative.  Negative for dizziness, focal weakness, seizures and headaches.  ?Psychiatric/Behavioral:  Positive for depression. Negative for suicidal ideas. The patient is nervous/anxious and has insomnia.    ?Assessment and Plan: ?Diagnoses and all orders for this visit: ? ?Insomnia secondary to depression with anxiety ?-     metoprolol tartrate (LOPRESSOR) 25 MG tablet; Take 0.5 tablets (12.5 mg total) by mouth 2 (two) times daily. ? ?Essential hypertension ?-     traZODone (DESYREL)  100 MG tablet; Take 0.5-1 tablets (50-100 mg total) by mouth at bedtime. ? ?  ? ?Follow Up Instructions ?Return if symptoms worsen or fail to improve.  ? ?  ?I discussed the assessment and treatment plan with the patient. The patient was provided an opportunity to ask questions and all were answered. The patient agreed with the plan and demonstrated an understanding of the instructions. ?  ?The patient was advised to call back or seek an in-person evaluation if the symptoms worsen or if the condition fails to improve as anticipated. ? ?I provided 13 minutes of non-face-to-face time during this encounter including median intraservice time, reviewing previous notes, labs, imaging, medications and explaining diagnosis and management. ? ?Gildardo Pounds, FNP-BC  ?

## 2022-01-15 ENCOUNTER — Encounter: Payer: Self-pay | Admitting: Orthopaedic Surgery

## 2022-01-15 ENCOUNTER — Other Ambulatory Visit: Payer: Self-pay

## 2022-01-15 ENCOUNTER — Ambulatory Visit (HOSPITAL_BASED_OUTPATIENT_CLINIC_OR_DEPARTMENT_OTHER): Payer: Managed Care, Other (non HMO) | Admitting: Nurse Practitioner

## 2022-01-15 ENCOUNTER — Encounter: Payer: Self-pay | Admitting: Nurse Practitioner

## 2022-01-15 ENCOUNTER — Other Ambulatory Visit: Payer: Self-pay | Admitting: Nurse Practitioner

## 2022-01-15 ENCOUNTER — Ambulatory Visit (INDEPENDENT_AMBULATORY_CARE_PROVIDER_SITE_OTHER): Payer: Managed Care, Other (non HMO) | Admitting: Orthopaedic Surgery

## 2022-01-15 ENCOUNTER — Ambulatory Visit: Payer: Managed Care, Other (non HMO) | Attending: Nurse Practitioner

## 2022-01-15 VITALS — BP 158/85 | HR 75 | Ht 62.0 in | Wt 170.0 lb

## 2022-01-15 DIAGNOSIS — R350 Frequency of micturition: Secondary | ICD-10-CM

## 2022-01-15 DIAGNOSIS — M545 Low back pain, unspecified: Secondary | ICD-10-CM | POA: Diagnosis not present

## 2022-01-15 DIAGNOSIS — G8929 Other chronic pain: Secondary | ICD-10-CM

## 2022-01-16 ENCOUNTER — Encounter: Payer: Self-pay | Admitting: Nurse Practitioner

## 2022-01-16 ENCOUNTER — Other Ambulatory Visit: Payer: Self-pay | Admitting: Nurse Practitioner

## 2022-01-16 DIAGNOSIS — Z1231 Encounter for screening mammogram for malignant neoplasm of breast: Secondary | ICD-10-CM

## 2022-01-16 LAB — URINALYSIS, COMPLETE
Bilirubin, UA: NEGATIVE
Glucose, UA: NEGATIVE
Ketones, UA: NEGATIVE
Leukocytes,UA: NEGATIVE
Nitrite, UA: NEGATIVE
Protein,UA: NEGATIVE
RBC, UA: NEGATIVE
Specific Gravity, UA: 1.017 (ref 1.005–1.030)
Urobilinogen, Ur: 0.2 mg/dL (ref 0.2–1.0)
pH, UA: 5.5 (ref 5.0–7.5)

## 2022-01-16 LAB — MICROSCOPIC EXAMINATION
Bacteria, UA: NONE SEEN
Casts: NONE SEEN /lpf
Epithelial Cells (non renal): NONE SEEN /hpf (ref 0–10)

## 2022-01-16 NOTE — Progress Notes (Signed)
Virtual Visit via Telephone Note ?Due to national recommendations of social distancing due to [place] 19, telehealth visit is felt to be most appropriate for this patient at this time.  I discussed the limitations, risks, security and privacy concerns of performing an evaluation and management service by telephone and the availability of in person appointments. I also discussed with the patient that there may be a patient responsible charge related to this service. The patient expressed understanding and agreed to proceed.  ? ? ?I connected with Lauren [name] on 01/16/22  at   2:30 PM EDT  EDT by telephone and verified that I am speaking with the correct person using two identifiers. ? ?Location of Patient: ?Private Residence ?  ?Location of Provider: ?Scientist, research (physical sciences) and [employer] Office  ?  ?Persons participating in Telemedicine visit: ?[name] FNP-BC ?Lauren [name]  ?  ?History of Present Illness: ?Telemedicine visit for: UTI symptoms ? ?Urinary symptoms ? ?She reports new onset urinary frequency and urinary urgency. The current episode started a few weeks ago. Patient states symptoms are moderate in intensity, occurring every few hours. She  has not been recently treated for similar symptoms.  ?  ?Associated symptoms: ?Yes abdominal pain Yes back pain  ?No chills No constipation  ?No cramping No diarrhea  ?No discharge No fever  ?No hematuria No nausea  ?No vomiting   ? ?---------------------------------------------------------------------------------------  ? ? ? ?Past Medical History:  ?Diagnosis Date  ? Anxiety   ? Arthritis   ? Depression   ? GERD (gastroesophageal reflux disease)   ? Hypertension   ? Prediabetes   ? Sleep apnea   ? Past hx - no cpap now   ?  ?Past Surgical History:  ?Procedure Laterality Date  ? ABDOMINAL HYSTERECTOMY    ? BACK SURGERY    ? CESAREAN SECTION    ? COLONOSCOPY    ? HAND SURGERY    ? NECK SURGERY  2002  ? plate in her neck  ?  ?Family History  ?Problem  Relation Age of Onset  ? Diabetes Father   ? Healthy Mother   ? Colon cancer Neg Hx   ? Colon polyps Neg Hx   ? Esophageal cancer Neg Hx   ? Rectal cancer Neg Hx   ? Stomach cancer Neg Hx   ?  ?Social History  ? ?Socioeconomic History  ? Marital status: Single  ?  Spouse name: Not on file  ? Number of children: Not on file  ? Years of education: Not on file  ? Highest education level: Not on file  ?Occupational History  ? Not on file  ?Tobacco Use  ? Smoking status: Every Day  ?  Packs/day: 0.50  ?  Types: Cigarettes  ? Smokeless tobacco: Never  ? Tobacco comments:  ?  0.5 or less   ?Vaping Use  ? Vaping Use: Never used  ?Substance and Sexual Activity  ? Alcohol use: No  ? Drug use: No  ? Sexual activity: Not Currently  ?  Birth control/protection: None  ?Other Topics Concern  ? Not on file  ?Social History Narrative  ? Not on file  ? ?Social Determinants of Health  ? ?Financial Resource Strain: Not on file  ?Food Insecurity: Not on file  ?Transportation Needs: Not on file  ?Physical Activity: Not on file  ?Stress: Not on file  ?Social Connections: Not on file  ?  ? ?Observations/Objective: ?Awake, alert and oriented x 3 ? ? ?Review of Systems  ?  Constitutional:  Negative for fever, malaise/fatigue and weight loss.  ?HENT: Negative.  Negative for nosebleeds.   ?Eyes: Negative.  Negative for blurred vision, double vision and photophobia.  ?Respiratory: Negative.  Negative for cough and shortness of breath.   ?Cardiovascular: Negative.  Negative for chest pain, palpitations and leg swelling.  ?Gastrointestinal: Negative.  Negative for heartburn, nausea and vomiting.  ?Genitourinary:  Positive for frequency and urgency. Negative for dysuria, flank pain and hematuria.  ?Musculoskeletal: Negative.  Negative for myalgias.  ?Neurological: Negative.  Negative for dizziness, focal weakness, seizures and headaches.  ?Psychiatric/Behavioral: Negative.  Negative for suicidal ideas.    ?Assessment and Plan: ?Diagnoses and all  orders for this visit: ? ?Frequency of urination ?UA Complete pending ?Avoid caffeinated beverage products ?  ? ?Follow Up Instructions ?Return if symptoms worsen or fail to improve.  ? ?  ?I discussed the assessment and treatment plan with the patient. The patient was provided an opportunity to ask questions and all were answered. The patient agreed with the plan and demonstrated an understanding of the instructions. ?  ?The patient was advised to call back or seek an in-person evaluation if the symptoms worsen or if the condition fails to improve as anticipated. ? ?I provided 6 minutes of non-face-to-face time during this encounter including median intraservice time, reviewing previous notes, labs, imaging, medications and explaining diagnosis and management. ? ?Gildardo Pounds, FNP-BC  ?

## 2022-01-16 NOTE — Progress Notes (Signed)
? ?Office Visit Note ?  ?Patient: Lauren [name]           ?Date of Birth: 1966-09-18           ?MRN: [id number] ?Visit Date: 01/15/2022 ?             ?Requested by: Gildardo Pounds, NP ?[address] ?[address] ?[place],  [place] [address] ?PCP: Gildardo Pounds, NP ? ? ?Assessment & Plan: ?Visit Diagnoses:  ?1. Chronic left-sided low back pain, unspecified whether sciatica present   ? ? ?Plan: Patient has some right L5-S1 disc bulge but no symptoms on the right.  Postop L3-4 laminotomy shows shallow protrusion without compression.  She has some central slightly left disc bulge at L4-5 with mild central stenosis and mild foraminal stenosis.  We discussed no surgery is recommended at this point.  She states she is significantly uncomfortable having problems we will set up for single epidural injection needed at L3-4 on the L4-5 level with Dr. Ernestina Patches.  She can follow-up if she has persistent problems. ? ?Follow-Up Instructions: No follow-ups on file.  ? ?Orders:  ?Orders Placed This Encounter  ?Procedures  ? Ambulatory referral to Physical Medicine Rehab  ? ?No orders of the defined types were placed in this encounter. ? ? ? ? Procedures: ?No procedures performed ? ? ?Clinical Data: ?No additional findings. ? ? ?Subjective: ?Chief Complaint  ?Patient presents with  ? Lower Back - Follow-up, Pain  ? ? ?HPI 56 year old female with states she has not gotten any relief with physical therapy.  Persistent pain in her back down her left leg.  She states she has a friend's that she had her nurse for reasons and this helped.  She has had previous surgery done in [place] 2014 left L3-4 microdiscectomy well which helped.  MRI scan 08/23/2021 was reviewed with patient again today. ? ?Review of Systems updated unchanged.  No bowel bladder symptoms no chills or fever. ? ? ?Objective: ?Vital Signs: BP (!) 158/85   Pulse 75   Ht '5\' 2"'$  (1.575 m)   Wt 170 lb (77.1 kg)   BMI 31.09 kg/m?  ? ?Physical Exam ?Constitutional:   ?    Appearance: She is well-developed.  ?HENT:  ?   Head: Normocephalic.  ?   Right Ear: External ear normal.  ?   Left Ear: External ear normal. There is no impacted cerumen.  ?Eyes:  ?   Pupils: Pupils are equal, round, and reactive to light.  ?Neck:  ?   Thyroid: No thyromegaly.  ?   Trachea: No tracheal deviation.  ?Cardiovascular:  ?   Rate and Rhythm: Normal rate.  ?Pulmonary:  ?   Effort: Pulmonary effort is normal.  ?Abdominal:  ?   Palpations: Abdomen is soft.  ?Musculoskeletal:  ?   Cervical back: No rigidity.  ?Skin: ?   General: Skin is warm and dry.  ?Neurological:  ?   Mental Status: She is alert and oriented to person, place, and time.  ?Psychiatric:     ?   Behavior: Behavior normal.  ? ? ?Ortho Exam patient is able to heel and toe walk well-healed lumbar incision at the L3-4 level.  Mild sciatic notch tenderness on the left negative straight leg raising 90 degrees. ? ?Specialty Comments:  ?MRI LUMBAR SPINE WITHOUT AND WITH CONTRAST ?  ?TECHNIQUE: ?Multiplanar and multiecho pulse sequences of the lumbar spine were ?obtained without and with intravenous contrast. ?  ?CONTRAST:  87m MULTIHANCE  GADOBENATE DIMEGLUMINE 529 MG/ML IV SOLN ?  ?COMPARISON:  Lumbar spine radiographs 07/24/2021 ?  ?FINDINGS: ?Segmentation:  5 lumbar segments. ?  ?Alignment:  Normal ?  ?Vertebrae:  Normal bone marrow.  Negative for fracture or mass. ?  ?Conus medullaris and cauda equina: Conus extends to the L1-2 level. ?Conus and cauda equina appear normal. ?  ?Paraspinal and other soft tissues: Negative for paraspinous mass or ?fluid collection. ?  ?Disc levels: ?  ?L1-2: Negative ?  ?L2-3: Mild disc bulging. Negative for neural impingement or ?stenosis. ?  ?L3-4: Left laminotomy. Shallow left paracentral disc protrusion ?causing mild subarticular stenosis on the left. Spinal canal ?adequate in diameter. ?  ?L4-5: Mild to moderate central disc protrusion indenting the thecal ?sac and causing mild spinal stenosis. Mild facet  hypertrophy. Mild ?subarticular and foraminal stenosis bilaterally ?  ?L5-S1: Disc degeneration with disc bulging. Right paracentral disc ?protrusion flattening the thecal sac and causing right S1 nerve root ?impingement. There is also mild left S1 nerve root impingement due ?to disc bulging and facet hypertrophy. Spinal canal adequate in ?size. ?  ?IMPRESSION: ?1. Postop left laminotomy L3-4. Shallow left paracentral disc ?protrusion remains ?2. Mild subarticular and foraminal stenosis at L4-5 with mild spinal ?stenosis ?3. Disc bulging at L5-S1 with right paracentral disc protrusion. ?There is bilateral S1 nerve root impingement, right greater than ?left. ?  ?  ?Electronically Signed ?  By: Franchot Gallo M.D. ?  On: 08/23/2021 08:58 ? ?Imaging: ?No results found. ? ? ?PMFS History: ?Patient Active Problem List  ? Diagnosis Date Noted  ? Recurrent major depressive disorder, remission status unspecified ([place]) 12/17/2021  ? Left lumbar radiculitis 04/19/2021  ? Radiculopathy of cervical spine 08/25/2018  ? Pain in right hand 07/28/2018  ? Trigger finger, right ring finger 07/28/2018  ? Mixed hyperlipidemia 06/19/2017  ? Gastroesophageal reflux disease 04/15/2017  ? OSA (obstructive sleep apnea) 04/15/2017  ? Depression 11/28/2016  ? ?Past Medical History:  ?Diagnosis Date  ? Anxiety   ? Arthritis   ? Depression   ? GERD (gastroesophageal reflux disease)   ? Hypertension   ? Prediabetes   ? Sleep apnea   ? Past hx - no cpap now   ?  ?Family History  ?Problem Relation Age of Onset  ? Diabetes Father   ? Healthy Mother   ? Colon cancer Neg Hx   ? Colon polyps Neg Hx   ? Esophageal cancer Neg Hx   ? Rectal cancer Neg Hx   ? Stomach cancer Neg Hx   ?  ?Past Surgical History:  ?Procedure Laterality Date  ? ABDOMINAL HYSTERECTOMY    ? BACK SURGERY    ? CESAREAN SECTION    ? COLONOSCOPY    ? HAND SURGERY    ? NECK SURGERY  2002  ? plate in her neck  ? ?Social History  ? ?Occupational History  ? Not on file  ?Tobacco Use  ?  Smoking status: Every Day  ?  Packs/day: 0.50  ?  Types: Cigarettes  ? Smokeless tobacco: Never  ? Tobacco comments:  ?  0.5 or less   ?Vaping Use  ? Vaping Use: Never used  ?Substance and Sexual Activity  ? Alcohol use: No  ? Drug use: No  ? Sexual activity: Not Currently  ?  Birth control/protection: None  ? ? ? ? ? ? ?

## 2022-01-22 ENCOUNTER — Other Ambulatory Visit: Payer: Self-pay | Admitting: Nurse Practitioner

## 2022-01-22 ENCOUNTER — Encounter: Payer: Self-pay | Admitting: Nurse Practitioner

## 2022-01-22 DIAGNOSIS — J418 Mixed simple and mucopurulent chronic bronchitis: Secondary | ICD-10-CM

## 2022-01-22 MED ORDER — MOMETASONE FURO-FORMOTEROL FUM 100-5 MCG/ACT IN AERO
2.0000 | INHALATION_SPRAY | Freq: Two times a day (BID) | RESPIRATORY_TRACT | 6 refills | Status: DC
  Filled 2022-01-22: qty 13, 30d supply, fill #0

## 2022-01-23 ENCOUNTER — Other Ambulatory Visit: Payer: Self-pay

## 2022-01-28 ENCOUNTER — Ambulatory Visit (HOSPITAL_COMMUNITY): Payer: Managed Care, Other (non HMO)

## 2022-01-28 ENCOUNTER — Other Ambulatory Visit: Payer: Self-pay

## 2022-01-28 NOTE — Telephone Encounter (Signed)
Lauren [name] can we see if the insurance will cover the dulera inhaler. Thanks!! ?

## 2022-01-29 ENCOUNTER — Other Ambulatory Visit: Payer: Self-pay

## 2022-02-01 ENCOUNTER — Other Ambulatory Visit: Payer: Self-pay | Admitting: Family Medicine

## 2022-02-01 ENCOUNTER — Other Ambulatory Visit: Payer: Self-pay

## 2022-02-01 ENCOUNTER — Other Ambulatory Visit: Payer: Self-pay | Admitting: Nurse Practitioner

## 2022-02-01 DIAGNOSIS — E785 Hyperlipidemia, unspecified: Secondary | ICD-10-CM

## 2022-02-01 DIAGNOSIS — I10 Essential (primary) hypertension: Secondary | ICD-10-CM

## 2022-02-01 DIAGNOSIS — F339 Major depressive disorder, recurrent, unspecified: Secondary | ICD-10-CM

## 2022-02-01 DIAGNOSIS — K219 Gastro-esophageal reflux disease without esophagitis: Secondary | ICD-10-CM

## 2022-02-01 MED ORDER — PANTOPRAZOLE SODIUM 20 MG PO TBEC
20.0000 mg | DELAYED_RELEASE_TABLET | Freq: Two times a day (BID) | ORAL | 1 refills | Status: DC
  Filled 2022-02-01: qty 30, 15d supply, fill #0

## 2022-02-01 MED ORDER — VENLAFAXINE HCL ER 150 MG PO CP24
150.0000 mg | ORAL_CAPSULE | Freq: Every morning | ORAL | 1 refills | Status: DC
  Filled 2022-02-01: qty 30, 30d supply, fill #0

## 2022-02-01 MED ORDER — LISINOPRIL 20 MG PO TABS
ORAL_TABLET | Freq: Every day | ORAL | 1 refills | Status: DC
  Filled 2022-02-01: qty 30, 30d supply, fill #0

## 2022-02-01 MED ORDER — ATORVASTATIN CALCIUM 10 MG PO TABS
ORAL_TABLET | Freq: Every day | ORAL | 1 refills | Status: DC
  Filled 2022-02-01: qty 30, 30d supply, fill #0

## 2022-02-04 ENCOUNTER — Other Ambulatory Visit: Payer: Self-pay

## 2022-02-11 ENCOUNTER — Ambulatory Visit: Payer: Managed Care, Other (non HMO)

## 2022-02-12 ENCOUNTER — Ambulatory Visit
Admission: RE | Admit: 2022-02-12 | Discharge: 2022-02-12 | Disposition: A | Discharge status: Home / Self Care | Payer: Managed Care, Other (non HMO) | Source: Ambulatory Visit | Attending: Nurse Practitioner | Admitting: Nurse Practitioner

## 2022-02-12 DIAGNOSIS — Z1231 Encounter for screening mammogram for malignant neoplasm of breast: Secondary | ICD-10-CM

## 2022-02-14 ENCOUNTER — Encounter: Payer: Managed Care, Other (non HMO) | Admitting: Physical Medicine and Rehabilitation

## 2022-02-16 ENCOUNTER — Emergency Department (HOSPITAL_BASED_OUTPATIENT_CLINIC_OR_DEPARTMENT_OTHER)
Admission: EM | Admit: 2022-02-16 | Discharge: 2022-02-16 | Disposition: A | Discharge status: Home / Self Care | Payer: Managed Care, Other (non HMO) | Attending: Emergency Medicine | Admitting: Emergency Medicine

## 2022-02-16 ENCOUNTER — Other Ambulatory Visit: Payer: Self-pay

## 2022-02-16 ENCOUNTER — Encounter (HOSPITAL_BASED_OUTPATIENT_CLINIC_OR_DEPARTMENT_OTHER): Payer: Self-pay | Admitting: Obstetrics and Gynecology

## 2022-02-16 ENCOUNTER — Emergency Department (HOSPITAL_BASED_OUTPATIENT_CLINIC_OR_DEPARTMENT_OTHER): Payer: Managed Care, Other (non HMO) | Admitting: Radiology

## 2022-02-16 DIAGNOSIS — R0789 Other chest pain: Secondary | ICD-10-CM | POA: Diagnosis present

## 2022-02-16 DIAGNOSIS — R0602 Shortness of breath: Secondary | ICD-10-CM | POA: Insufficient documentation

## 2022-02-16 DIAGNOSIS — D72829 Elevated white blood cell count, unspecified: Secondary | ICD-10-CM | POA: Diagnosis not present

## 2022-02-16 DIAGNOSIS — R61 Generalized hyperhidrosis: Secondary | ICD-10-CM | POA: Diagnosis not present

## 2022-02-16 DIAGNOSIS — Z20822 Contact with and (suspected) exposure to covid-19: Secondary | ICD-10-CM | POA: Insufficient documentation

## 2022-02-16 DIAGNOSIS — R5383 Other fatigue: Secondary | ICD-10-CM | POA: Insufficient documentation

## 2022-02-16 DIAGNOSIS — R079 Chest pain, unspecified: Secondary | ICD-10-CM

## 2022-02-16 LAB — BASIC METABOLIC PANEL
Anion gap: 10 (ref 5–15)
BUN: 17 mg/dL (ref 6–20)
CO2: 23 mmol/L (ref 22–32)
Calcium: 10.1 mg/dL (ref 8.9–10.3)
Chloride: 107 mmol/L (ref 98–111)
Creatinine, Ser: 0.79 mg/dL (ref 0.44–1.00)
GFR, Estimated: >60 mL/min (ref >60)
Glucose, Bld: 106 mg/dL — ABNORMAL HIGH (ref 70–99)
Potassium: 4.1 mmol/L (ref 3.5–5.1)
Sodium: 140 mmol/L (ref 135–145)

## 2022-02-16 LAB — CBC
HCT: 37.8 % (ref 36.0–46.0)
Hemoglobin: 12.1 g/dL (ref 12.0–15.0)
MCH: 28 pg (ref 26.0–34.0)
MCHC: 32 g/dL (ref 30.0–36.0)
MCV: 87.5 fL (ref 80.0–100.0)
Platelets: 439 10*3/uL — ABNORMAL HIGH (ref 150–400)
RBC: 4.32 MIL/uL (ref 3.87–5.11)
RDW: 13.5 % (ref 11.5–15.5)
WBC: 10.9 10*3/uL — ABNORMAL HIGH (ref 4.0–10.5)
nRBC: 0 % (ref 0.0–0.2)

## 2022-02-16 LAB — TROPONIN I (HIGH SENSITIVITY)
Troponin I (High Sensitivity): <2 ng/L (ref <18)
Troponin I (High Sensitivity): <2 ng/L (ref <18)

## 2022-02-16 LAB — RESP PANEL BY RT-PCR (FLU A&B, COVID) ARPGX2
Influenza A by PCR: NEGATIVE
Influenza B by PCR: NEGATIVE
SARS Coronavirus 2 by RT PCR: NEGATIVE

## 2022-02-16 MED ORDER — ACETAMINOPHEN 325 MG PO TABS
650.0000 mg | ORAL_TABLET | Freq: Once | ORAL | Status: AC
  Administered 2022-02-16: 650 mg via ORAL
  Filled 2022-02-16: qty 2

## 2022-02-16 NOTE — ED Notes (Signed)
Pt agreeable with d/c plan as discussed by provider- this nurse has verbally reinforced d/c instructions and provided pt with written copy- pt acknowledges verbal understanding and denies any additional questions, concerns, needs- ambulatory at d/c independently with steady gait; no distress noted; vitals stable.  ?

## 2022-02-16 NOTE — Discharge Instructions (Signed)
Please use Tylenol or ibuprofen for pain.  You may use 600 mg ibuprofen every 6 hours or 1000 mg of Tylenol every 6 hours.  You may choose to alternate between the 2.  This would be most effective.  Not to exceed 4 g of Tylenol within 24 hours.  Not to exceed 3200 mg ibuprofen 24 hours.  

## 2022-02-16 NOTE — ED Provider Notes (Signed)
?[facility] ?Provider Note ? ? ?CSN: [id number] ?Arrival date & time: 02/16/22  1633 ? ?  ? ?History ? ?Chief Complaint  ?Patient presents with  ? Chest Pain  ? ? ?Lauren [name] is a 56 y.o. female with a past medical history significant for depression, anxiety, acid reflux, hyperlipidemia who presents with concern for chest tightness.  Patient reports that she initially thought that she was having a hot flash, but then became hot, sweaty, and felt fatigued.  She also endorsed some shortness of breath, which overall improved after using her inhaler.  She denies history of asthma, COPD, reports that she has shortness of breath associated with her acid reflux.  Patient also reports that she has been going through some life stress, working last 7 days in a row at her job. ? ? ?Chest Pain ?Associated symptoms: shortness of breath   ? ?  ? ?Home Medications ?Prior to Admission medications   ?Medication Sig Start Date End Date Taking? Authorizing Provider  ?albuterol (PROAIR HFA) 108 (90 Base) MCG/ACT inhaler Inhale 1-2 puffs into the lungs every 6 (six) hours as needed for wheezing or shortness of breath. 12/17/21   Charlott Rakes, MD  ?atorvastatin (LIPITOR) 10 MG tablet TAKE 1 TABLET (10 MG TOTAL) BY MOUTH DAILY. 02/01/22 02/01/23  Charlott Rakes, MD  ?ibuprofen (ADVIL) 600 MG tablet Take 1 tablet (600 mg total) by mouth every 8 (eight) hours as needed. 02/08/21   Gildardo Pounds, NP  ?lisinopril (ZESTRIL) 20 MG tablet TAKE 1 TABLET (20 MG TOTAL) BY MOUTH DAILY. 02/01/22 02/01/23  Charlott Rakes, MD  ?metoprolol tartrate (LOPRESSOR) 25 MG tablet Take 0.5 tablets (12.5 mg total) by mouth 2 (two) times daily. 01/08/22 04/08/22  Gildardo Pounds, NP  ?Misc. Devices MISC Please provide patient with insurance approved supplies for her CPAP: tubing, chin strap and supplies and filters. 11/17/18   Gildardo Pounds, NP  ?mometasone-formoterol (DULERA) 100-5 MCG/ACT AERO Inhale 2 puffs into the lungs 2  (two) times daily. 01/22/22   Gildardo Pounds, NP  ?Multiple Vitamin (MULTIVITAMIN) capsule Take by mouth.    [provider]  ?pantoprazole (PROTONIX) 20 MG tablet Take 1 tablet (20 mg total) by mouth 2 (two) times daily. 02/01/22 05/02/22  Charlott Rakes, MD  ?traMADol (ULTRAM) 50 MG tablet Take 1 tablet (50 mg total) by mouth every 6 (six) hours as needed. 03/21/21   Kirsteins, Luanna Salk, MD  ?traZODone (DESYREL) 100 MG tablet Take 0.5-1 tablets (50-100 mg total) by mouth at bedtime. 01/08/22   Gildardo Pounds, NP  ?venlafaxine XR (EFFEXOR-XR) 150 MG 24 hr capsule Take 1 capsule (150 mg total) by mouth every morning. 02/01/22   Charlott Rakes, MD  ?amitriptyline (ELAVIL) 75 MG tablet Take 1 tablet (75 mg total) by mouth at bedtime. 10/06/18 07/20/20  Gildardo Pounds, NP  ?Cetirizine HCl 10 MG CAPS Take 1 capsule (10 mg total) by mouth daily for 10 days. 02/19/19 07/20/20  Wieters, Hallie C, PA-C  ?DULoxetine (CYMBALTA) 30 MG capsule Take 1 capsule (30 mg total) by mouth daily. 09/22/20 01/03/21  Gildardo Pounds, NP  ?fluticasone (FLONASE) 50 MCG/ACT nasal spray Place 2 sprays into both nostrils daily for 14 days. 02/10/19 07/20/20  Gildardo Pounds, NP  ?sucralfate (CARAFATE) 1 g tablet Take 1 tablet (1 g total) by mouth 3 (three) times daily with meals for 14 days. 03/15/19 07/20/20  Couture, Cortni S, PA-C  ?   ? ?Allergies    ?  Iodinated contrast media   ? ?Review of Systems   ?Review of Systems  ?Respiratory:  Positive for chest tightness and shortness of breath.   ?Cardiovascular:  Positive for chest pain.  ?Psychiatric/Behavioral:  The patient is nervous/anxious.   ?All other systems reviewed and are negative. ? ?Physical Exam ?Updated Vital Signs ?BP 127/76   Pulse 83   Temp 100 ?F (37.8 ?C)   Resp 13   Ht '5\' 2"'$  (1.575 m)   Wt 74.8 kg   SpO2 96%   BMI 30.18 kg/m?  ?Physical Exam ?Vitals and nursing note reviewed.  ?Constitutional:   ?   General: She is not in acute distress. ?   Appearance: Normal  appearance.  ?HENT:  ?   Head: Normocephalic and atraumatic.  ?Eyes:  ?   General:     ?   Right eye: No discharge.     ?   Left eye: No discharge.  ?Neck:  ?   Comments: Normal ROM, no meningismus or TTP. ?Cardiovascular:  ?   Rate and Rhythm: Normal rate and regular rhythm.  ?   Heart sounds: No murmur heard. ?  No friction rub. No gallop.  ?Pulmonary:  ?   Effort: Pulmonary effort is normal.  ?   Breath sounds: Normal breath sounds.  ?   Comments: No accessory breath sounds noted ?Chest:  ?   Comments: Some tenderness to palpation of the chest wall ?Abdominal:  ?   General: Bowel sounds are normal.  ?   Palpations: Abdomen is soft.  ?Skin: ?   General: Skin is warm and dry.  ?   Capillary Refill: Capillary refill takes less than 2 seconds.  ?Neurological:  ?   Mental Status: She is alert and oriented to person, place, and time.  ?Psychiatric:     ?   Mood and Affect: Mood normal.     ?   Behavior: Behavior normal.  ? ? ?ED Results / Procedures / Treatments   ?Labs ?(all labs ordered are listed, but only abnormal results are displayed) ?Labs Reviewed  ?BASIC METABOLIC PANEL - Abnormal; Notable for the following components:  ?    Result Value  ? Glucose, Bld 106 (*)   ? All other components within normal limits  ?CBC - Abnormal; Notable for the following components:  ? WBC 10.9 (*)   ? Platelets 439 (*)   ? All other components within normal limits  ?RESP PANEL BY RT-PCR (FLU A&B, COVID) ARPGX2  ?TROPONIN I (HIGH SENSITIVITY)  ?TROPONIN I (HIGH SENSITIVITY)  ? ? ?EKG ?None ? ?Radiology ?DG Chest 2 View ? ?Result Date: 02/16/2022 ?CLINICAL DATA:  Chest pain EXAM: CHEST - 2 VIEW COMPARISON:  11/05/2021 FINDINGS: The heart size and mediastinal contours are within normal limits. Minimal atherosclerotic calcification of the aortic knob. Both lungs are clear. The visualized skeletal structures are unremarkable. IMPRESSION: No active cardiopulmonary disease. Electronically Signed   By: Davina Poke D.O.   On:  02/16/2022 16:58   ? ?Procedures ?Procedures  ? ? ?Medications Ordered in ED ?Medications  ?acetaminophen (TYLENOL) tablet 650 mg (650 mg Oral Given 02/16/22 1808)  ? ? ?ED Course/ Medical Decision Making/ A&P ?  ?                        ?Medical Decision Making ?Amount and/or Complexity of Data Reviewed ?Labs: ordered. ?Radiology: ordered. ? ?Risk ?OTC drugs. ? ? ?This patient presents to the ED for concern  of chest pain, tightness, shortness of breath, anxiety, this involves an extensive number of treatment options, and is a complaint that carries with it a high risk of complications and morbidity. The emergent differential diagnosis prior to evaluation includes, but is not limited to,  ACS, pneumonia, pericarditis, myocarditis, other infectious etiology, GERD, anxiety / panic attack, MSK pain, boerhaaves, mallory weiss, vs. other.  ? ?This is not an exhaustive differential.  ? ?Past Medical History / Co-morbidities / Social History: ?depression, anxiety, acid reflux, hyperlipidemia ? ?Additional history: ?Chart reviewed. Pertinent results include: outpatient family medicine visits, recent ED imagine, labwork ? ?Physical Exam: ?Physical exam performed. The pertinent findings include: patient with no accessory breath sounds, no meningismus, some tenderness to palpation of the chest wall. Patient does endorse increased stress, anxiety. She is non-ill appearing, non-diaphoretic. ? ?Lab Tests: ?I ordered, and personally interpreted labs.  The pertinent results include:  unremarkable BMP, CBC with mild leukocytosis of 10.9. RVP negative. Troponin negative x 2. Leukocytosis may be secondary to possible non-cardiac related viral illness, along with patient's subacute fever of 100.0. ?  ?Imaging Studies: ?I ordered imaging studies including plain film radiographs of the chest. I independently visualized and interpreted imaging which showed no evidence of intrathoracic abnormality. I agree with the radiologist  interpretation. ?  ?Cardiac Monitoring:  ?The patient was maintained on a cardiac monitor.  My attending physician Dr. Sherry Ruffing viewed and interpreted the cardiac monitored which showed an underlying rhythm of: NSR, non-specific T wave

## 2022-02-16 NOTE — ED Triage Notes (Signed)
Patient reports to the ER for chest tightness. Patient reports she was at work and she felt like she may be having a hot flash and then became diaphoretic and felt severe fatigue. Patient reports she also felt some ShOB but she reports she used her inhaler  ?

## 2022-02-18 ENCOUNTER — Encounter: Payer: Self-pay | Admitting: Nurse Practitioner

## 2022-03-11 ENCOUNTER — Ambulatory Visit: Payer: Managed Care, Other (non HMO) | Admitting: Nurse Practitioner

## 2022-03-21 ENCOUNTER — Emergency Department (HOSPITAL_COMMUNITY): Payer: Managed Care, Other (non HMO)

## 2022-03-21 ENCOUNTER — Emergency Department (HOSPITAL_COMMUNITY)
Admission: EM | Admit: 2022-03-21 | Discharge: 2022-03-21 | Disposition: A | Discharge status: Home / Self Care | Payer: Managed Care, Other (non HMO) | Attending: Emergency Medicine | Admitting: Emergency Medicine

## 2022-03-21 ENCOUNTER — Other Ambulatory Visit: Payer: Self-pay

## 2022-03-21 ENCOUNTER — Encounter (HOSPITAL_COMMUNITY): Payer: Self-pay

## 2022-03-21 DIAGNOSIS — I471 Supraventricular tachycardia: Secondary | ICD-10-CM | POA: Insufficient documentation

## 2022-03-21 DIAGNOSIS — Z7982 Long term (current) use of aspirin: Secondary | ICD-10-CM | POA: Diagnosis not present

## 2022-03-21 DIAGNOSIS — I10 Essential (primary) hypertension: Secondary | ICD-10-CM | POA: Insufficient documentation

## 2022-03-21 DIAGNOSIS — Z79899 Other long term (current) drug therapy: Secondary | ICD-10-CM | POA: Insufficient documentation

## 2022-03-21 DIAGNOSIS — R000 Tachycardia, unspecified: Secondary | ICD-10-CM | POA: Diagnosis present

## 2022-03-21 HISTORY — DX: Supraventricular tachycardia, unspecified: I47.10

## 2022-03-21 HISTORY — DX: Supraventricular tachycardia: I47.1

## 2022-03-21 LAB — CBC
HCT: 41 % (ref 36.0–46.0)
Hemoglobin: 12.6 g/dL (ref 12.0–15.0)
MCH: 27.8 pg (ref 26.0–34.0)
MCHC: 30.7 g/dL (ref 30.0–36.0)
MCV: 90.3 fL (ref 80.0–100.0)
Platelets: 415 10*3/uL — ABNORMAL HIGH (ref 150–400)
RBC: 4.54 MIL/uL (ref 3.87–5.11)
RDW: 14.5 % (ref 11.5–15.5)
WBC: 5.8 10*3/uL (ref 4.0–10.5)
nRBC: 0 % (ref 0.0–0.2)

## 2022-03-21 LAB — BASIC METABOLIC PANEL
Anion gap: 6 (ref 5–15)
BUN: 12 mg/dL (ref 6–20)
CO2: 21 mmol/L — ABNORMAL LOW (ref 22–32)
Calcium: 8.5 mg/dL — ABNORMAL LOW (ref 8.9–10.3)
Chloride: 114 mmol/L — ABNORMAL HIGH (ref 98–111)
Creatinine, Ser: 0.72 mg/dL (ref 0.44–1.00)
GFR, Estimated: >60 mL/min (ref >60)
Glucose, Bld: 109 mg/dL — ABNORMAL HIGH (ref 70–99)
Potassium: 3.8 mmol/L (ref 3.5–5.1)
Sodium: 141 mmol/L (ref 135–145)

## 2022-03-21 LAB — TROPONIN I (HIGH SENSITIVITY)
Troponin I (High Sensitivity): 6 ng/L
Troponin I (High Sensitivity): 14 ng/L

## 2022-03-21 LAB — MAGNESIUM: Magnesium: 1.9 mg/dL (ref 1.7–2.4)

## 2022-03-21 MED ORDER — METOPROLOL TARTRATE 25 MG PO TABS: 25.0000 mg | ORAL_TABLET | Freq: Two times a day (BID) | ORAL | 0 refills | Status: DC

## 2022-03-21 MED ORDER — SODIUM CHLORIDE 0.9 % IV BOLUS
1000.0000 mL | Freq: Once | INTRAVENOUS | Status: AC
  Administered 2022-03-21: 1000 mL via INTRAVENOUS

## 2022-03-21 NOTE — ED Triage Notes (Signed)
Patient woke up and had chest pain and diaphoresis with sob.  EMS ekg revealed svt in 160's and was given '6mg'$  adenosine and patient converted to NSR. Denies pain at this time. Patient has hx of this.

## 2022-03-21 NOTE — ED Provider Notes (Signed)
[facility] EMERGENCY DEPARTMENT Provider Note   CSN: [id number] Arrival date & time: 03/21/22  8416     History  Chief Complaint  Patient presents with   Tachycardia    Lauren [name] is a 56 y.o. female.  56 year old female brought in by EMS for chest pain. Patient woke up this morning with chest tightness, SHOB, nausea and feeling generally unwell.  EMS arrived, patient was found to be in SVT with rate in the 160s, given 6 mg of adenosine and converted to normal sinus rhythm.  Patient arrives in the emergency room pain-free and feeling fine.  Reports history of same previously, also treated with adenosine, states that she was admitted to the hospital at that time and was told that her electrolytes were off and that is what caused her SVT episode at that time.      Home Medications Prior to Admission medications   Medication Sig Start Date End Date Taking? Authorizing Provider  albuterol (PROAIR HFA) 108 (90 Base) MCG/ACT inhaler Inhale 1-2 puffs into the lungs every 6 (six) hours as needed for wheezing or shortness of breath. 12/17/21  Yes Charlott Rakes, MD  aspirin EC 81 MG tablet Take 81 mg by mouth daily. Swallow whole.   Yes [provider]  atorvastatin (LIPITOR) 10 MG tablet TAKE 1 TABLET (10 MG TOTAL) BY MOUTH DAILY. 02/01/22 02/01/23 Yes Newlin, Charlane Ferretti, MD  fluticasone (FLONASE) 50 MCG/ACT nasal spray Place 2 sprays into both nostrils daily. 03/04/22  Yes [provider]  lisinopril (ZESTRIL) 20 MG tablet TAKE 1 TABLET (20 MG TOTAL) BY MOUTH DAILY. 02/01/22 02/01/23 Yes Charlott Rakes, MD  metoprolol tartrate (LOPRESSOR) 25 MG tablet Take 0.5 tablets (12.5 mg total) by mouth 2 (two) times daily. Patient taking differently: Take 25 mg by mouth 2 (two) times daily. 01/08/22 04/08/22 Yes Gildardo Pounds, NP  mometasone-formoterol (DULERA) 100-5 MCG/ACT AERO Inhale 2 puffs into the lungs 2 (two) times daily. 01/22/22  Yes Gildardo Pounds, NP   Multiple Vitamin (MULTIVITAMIN) capsule Take by mouth.   Yes [provider]  omeprazole (PRILOSEC) 20 MG capsule Take 20 mg by mouth 2 (two) times daily. 03/04/22  Yes [provider]  traZODone (DESYREL) 100 MG tablet Take 0.5-1 tablets (50-100 mg total) by mouth at bedtime. 01/08/22  Yes Gildardo Pounds, NP  venlafaxine XR (EFFEXOR-XR) 150 MG 24 hr capsule Take 1 capsule (150 mg total) by mouth every morning. 02/01/22  Yes Charlott Rakes, MD  ibuprofen (ADVIL) 600 MG tablet Take 1 tablet (600 mg total) by mouth every 8 (eight) hours as needed. Patient not taking: Reported on 03/21/2022 02/08/21   Gildardo Pounds, NP  Misc. Devices MISC Please provide patient with insurance approved supplies for her CPAP: tubing, chin strap and supplies and filters. 11/17/18   Gildardo Pounds, NP  pantoprazole (PROTONIX) 20 MG tablet Take 1 tablet (20 mg total) by mouth 2 (two) times daily. Patient not taking: Reported on 03/21/2022 02/01/22 05/02/22  Charlott Rakes, MD  traMADol (ULTRAM) 50 MG tablet Take 1 tablet (50 mg total) by mouth every 6 (six) hours as needed. Patient not taking: Reported on 03/21/2022 03/21/21   Charlett Blake, MD  amitriptyline (ELAVIL) 75 MG tablet Take 1 tablet (75 mg total) by mouth at bedtime. 10/06/18 07/20/20  Gildardo Pounds, NP  Cetirizine HCl 10 MG CAPS Take 1 capsule (10 mg total) by mouth daily for 10 days. 02/19/19 07/20/20  Debara Pickett  C, PA-C  DULoxetine (CYMBALTA) 30 MG capsule Take 1 capsule (30 mg total) by mouth daily. 09/22/20 01/03/21  Gildardo Pounds, NP  sucralfate (CARAFATE) 1 g tablet Take 1 tablet (1 g total) by mouth 3 (three) times daily with meals for 14 days. 03/15/19 07/20/20  Couture, Cortni S, PA-C      Allergies    Iodinated contrast media    Review of Systems   Review of Systems Negative except as per HPI Physical Exam Updated Vital Signs BP 137/85   Pulse 75   Temp 98.5 F (36.9 C) (Oral)   Resp 20   Ht '5\' 2"'$  (1.575 m)   Wt  74.8 kg   SpO2 98%   BMI 30.18 kg/m  Physical Exam Vitals and nursing note reviewed.  Constitutional:      General: She is not in acute distress.    Appearance: She is well-developed. She is not diaphoretic.  HENT:     Head: Normocephalic and atraumatic.  Cardiovascular:     Rate and Rhythm: Normal rate and regular rhythm.     Heart sounds: Normal heart sounds.  Pulmonary:     Effort: Pulmonary effort is normal.     Breath sounds: Normal breath sounds.  Abdominal:     Palpations: Abdomen is soft.     Tenderness: There is no abdominal tenderness.  Musculoskeletal:     Right lower leg: No edema.     Left lower leg: No edema.  Skin:    General: Skin is warm and dry.     Findings: No erythema or rash.  Neurological:     Mental Status: She is alert and oriented to person, place, and time.  Psychiatric:        Behavior: Behavior normal.    ED Results / Procedures / Treatments   Labs (all labs ordered are listed, but only abnormal results are displayed) Labs Reviewed  BASIC METABOLIC PANEL - Abnormal; Notable for the following components:      Result Value   Chloride 114 (*)    CO2 21 (*)    Glucose, Bld 109 (*)    Calcium 8.5 (*)    All other components within normal limits  CBC - Abnormal; Notable for the following components:   Platelets 415 (*)    All other components within normal limits  MAGNESIUM  TROPONIN I (HIGH SENSITIVITY)  TROPONIN I (HIGH SENSITIVITY)    EKG EKG Interpretation  Date/Time:  Thursday March 21 2022 09:47:18 EDT Ventricular Rate:  82 PR Interval:  190 QRS Duration: 77 QT Interval:  376 QTC Calculation: 440 R Axis:   69 Text Interpretation: Sinus rhythm Consider left atrial enlargement Anteroseptal infarct, age indeterminate No significant change since last tracing Confirmed by Isla Pence ([id number]) on 03/21/2022 11:23:22 AM       Radiology DG Chest Port 1 View  Result Date: 03/21/2022 CLINICAL DATA:  Chest pain. EXAM: PORTABLE  CHEST 1 VIEW COMPARISON:  Chest two views 02/16/2022, 02/11/2021 FINDINGS: Cardiac silhouette and mediastinal contours are within normal limits. Mild calcification within aortic arch. Unchanged mild chronic bilateral basilar interstitial thickening, likely scarring. No acute airspace opacity. No pleural effusion pneumothorax. No acute skeletal abnormality. IMPRESSION: No active cardiopulmonary disease. Electronically Signed   By: Yvonne Kendall M.D.   On: 03/21/2022 10:24    Procedures Procedures    Medications Ordered in ED Medications  sodium chloride 0.9 % bolus 1,000 mL (0 mLs Intravenous Stopped 03/21/22 1110)    ED Course/  Medical Decision Making/ A&P                           Medical Decision Making Amount and/or Complexity of Data Reviewed Labs: ordered. Radiology: ordered.   This patient presents to the ED for concern of chest tightness, found to be in SVT by EMS, this involves an extensive number of treatment options, and is a complaint that carries with it a high risk of complications and morbidity.  The differential diagnosis includes but not limited to arrhythmia, SVT, ACS   Co morbidities that complicate the patient evaluation  SVT, prediabetes, GERD, hypertension, anxiety   Additional history obtained:  Additional history obtained from EMS who reports SVT in the 160s, resolved after 6 mg of adenosine External records from outside source obtained and reviewed including recent visit to pulmonology on 03/04/2022, history of OSA, without CPAP.  Lab Tests:  I Ordered, and personally interpreted labs.  The pertinent results include: CBC with normal white count, normal hemoglobin hematocrit.  BMP with out significant abnormality.  Initial troponin is 6, pending repeat.  Magnesium normal. Repeat troponin 14   Imaging Studies ordered:  I ordered imaging studies including chest x-ray I independently visualized and interpreted imaging which showed no acute disease I agree  with the radiologist interpretation   Cardiac Monitoring: / EKG:  The patient was maintained on a cardiac monitor.  I personally viewed and interpreted the cardiac monitored which showed an underlying rhythm of: Sinus rhythm, rate 60s   Consultations Obtained:  I requested consultation with the ER attending Dr. Gilford Raid,  and discussed lab and imaging findings as well as pertinent plan - they recommend: confirm patient is on her betablocker (she is, '25mg'$  BID), refer to cardiology if pt does not already see cards. Repeat troponin 14 (initial 6), ok for dc.   Problem List / ED Course / Critical interventions / Medication management  56 year old female with history of SVT brought in by EMS, found in SVT this morning with rate of 160, treated with '6mg'$  adenosine and resolved. Work up reassuring, remains in NSR, stable for dc with ambulatory referral to cardiology for follow up.  I ordered medication including IVF  for hydration post conversion with adenosine with EMS  Reevaluation of the patient after these medicines showed that the patient stayed the same I have reviewed the patients home medicines and have made adjustments as needed   Social Determinants of Health:  Has PCP, has not been seen by cardiology    Test / Admission - Considered:  Remains in sinus rhythm in the ER. On beta blocker already, will continue, referred to cardiology for OP follow up with return to ER precautions.          Final Clinical Impression(s) / ED Diagnoses Final diagnoses:  SVT (supraventricular tachycardia) ([place])    Rx / DC Orders ED Discharge Orders          Ordered    Ambulatory referral to Cardiology       Comments: If you have not heard from the Cardiology office within the next 72 hours please call [phone].   03/21/22 1309              Tacy Learn, PA-C 03/21/22 1353    Isla Pence, MD 03/21/22 1546

## 2022-03-21 NOTE — ED Notes (Signed)
2 attempts at blood draw for 2nd trop by this RN and keith emt unsuccessful.  [facility] called to attempt blood draw

## 2022-03-21 NOTE — Discharge Instructions (Addendum)
Follow-up with cardiology, given referral today.   Return to the emergency room if your symptoms return.   Follow-up with your doctor as needed.  Continue taking your metoprolol as prescribed.

## 2022-03-21 NOTE — ED Notes (Signed)
3rd person attempted blood draw for 2nd troponin which was unsuccessful.  Pa notified

## 2022-03-25 ENCOUNTER — Ambulatory Visit: Payer: Self-pay

## 2022-03-25 ENCOUNTER — Ambulatory Visit (INDEPENDENT_AMBULATORY_CARE_PROVIDER_SITE_OTHER): Payer: Managed Care, Other (non HMO) | Admitting: Physical Medicine and Rehabilitation

## 2022-03-25 ENCOUNTER — Encounter: Payer: Self-pay | Admitting: Physical Medicine and Rehabilitation

## 2022-03-25 VITALS — BP 122/72 | HR 66

## 2022-03-25 DIAGNOSIS — M5416 Radiculopathy, lumbar region: Secondary | ICD-10-CM

## 2022-03-25 MED ORDER — METHYLPREDNISOLONE ACETATE 80 MG/ML IJ SUSP
80.0000 mg | Freq: Once | INTRAMUSCULAR | Status: AC
  Administered 2022-03-25: 80 mg

## 2022-03-25 NOTE — Patient Instructions (Signed)

## 2022-03-25 NOTE — Progress Notes (Signed)
Pt state lower back pain that travels down her left leg. Pt state walking, standing and bending makes the pain worse. Pt state she doesn't takes anything for the pain.  Numeric Pain Rating Scale and Functional Assessment Average Pain 7   In the last MONTH (on 0-10 scale) has pain interfered with the following?  1. General activity like being  able to carry out your everyday physical activities such as walking, climbing stairs, carrying groceries, or moving a chair?  Rating(10)   +Driver, -BT, +Dye Allergies.

## 2022-03-31 ENCOUNTER — Emergency Department (HOSPITAL_BASED_OUTPATIENT_CLINIC_OR_DEPARTMENT_OTHER)
Admission: EM | Admit: 2022-03-31 | Discharge: 2022-03-31 | Disposition: A | Discharge status: Home / Self Care | Payer: Managed Care, Other (non HMO) | Attending: Emergency Medicine | Admitting: Emergency Medicine

## 2022-03-31 ENCOUNTER — Emergency Department (HOSPITAL_BASED_OUTPATIENT_CLINIC_OR_DEPARTMENT_OTHER): Payer: Managed Care, Other (non HMO) | Admitting: Radiology

## 2022-03-31 ENCOUNTER — Encounter (HOSPITAL_BASED_OUTPATIENT_CLINIC_OR_DEPARTMENT_OTHER): Payer: Self-pay | Admitting: Obstetrics and Gynecology

## 2022-03-31 ENCOUNTER — Other Ambulatory Visit: Payer: Self-pay

## 2022-03-31 DIAGNOSIS — Z7982 Long term (current) use of aspirin: Secondary | ICD-10-CM | POA: Diagnosis not present

## 2022-03-31 DIAGNOSIS — R079 Chest pain, unspecified: Secondary | ICD-10-CM | POA: Diagnosis present

## 2022-03-31 DIAGNOSIS — Z5321 Procedure and treatment not carried out due to patient leaving prior to being seen by health care provider: Secondary | ICD-10-CM | POA: Diagnosis not present

## 2022-03-31 LAB — BASIC METABOLIC PANEL
Anion gap: 11 (ref 5–15)
BUN: 22 mg/dL — ABNORMAL HIGH (ref 6–20)
CO2: 20 mmol/L — ABNORMAL LOW (ref 22–32)
Calcium: 9.7 mg/dL (ref 8.9–10.3)
Chloride: 106 mmol/L (ref 98–111)
Creatinine, Ser: 0.65 mg/dL (ref 0.44–1.00)
GFR, Estimated: >60 mL/min (ref >60)
Glucose, Bld: 128 mg/dL — ABNORMAL HIGH (ref 70–99)
Potassium: 3.6 mmol/L (ref 3.5–5.1)
Sodium: 137 mmol/L (ref 135–145)

## 2022-03-31 LAB — CBC
HCT: 38.5 % (ref 36.0–46.0)
Hemoglobin: 12.5 g/dL (ref 12.0–15.0)
MCH: 27.7 pg (ref 26.0–34.0)
MCHC: 32.5 g/dL (ref 30.0–36.0)
MCV: 85.4 fL (ref 80.0–100.0)
Platelets: 391 10*3/uL (ref 150–400)
RBC: 4.51 MIL/uL (ref 3.87–5.11)
RDW: 14.2 % (ref 11.5–15.5)
WBC: 9.1 10*3/uL (ref 4.0–10.5)
nRBC: 0 % (ref 0.0–0.2)

## 2022-03-31 LAB — TROPONIN I (HIGH SENSITIVITY): Troponin I (High Sensitivity): <2 ng/L

## 2022-03-31 NOTE — ED Triage Notes (Signed)
Patient reports to the ER for chest pain. Patient reports she feels her heart skipping beats and she gets the chills and then gets hot. Patient reports she was recently seen for SVT

## 2022-03-31 NOTE — ED Notes (Signed)
Pt requesting to leave. Pt encouraged to stay and wait on a provider. Pt refused. Signed AMA form and went home. No CP pain at time of departure.

## 2022-03-31 NOTE — ED Provider Notes (Signed)
[place] EMERGENCY DEPT Provider Note   CSN: [id number] Arrival date & time: 03/31/22  1740     History  Chief Complaint  Patient presents with   Chest Pain    Lauren [name] is a 56 y.o. female with chief complaint of chest pain and palpitations.  Hx of recently being seen for short runs of SVT.  I signed up for the patient to provide care.  Before I was able to initiate care for the patient, she left AGAINST MEDICAL ADVICE.   The history is provided by medical records (And ED nurse).  Chest Pain    Home Medications Prior to Admission medications   Medication Sig Start Date End Date Taking? Authorizing Provider  albuterol (PROAIR HFA) 108 (90 Base) MCG/ACT inhaler Inhale 1-2 puffs into the lungs every 6 (six) hours as needed for wheezing or shortness of breath. 12/17/21   Charlott Rakes, MD  aspirin EC 81 MG tablet Take 81 mg by mouth daily. Swallow whole.    [provider]  atorvastatin (LIPITOR) 10 MG tablet TAKE 1 TABLET (10 MG TOTAL) BY MOUTH DAILY. 02/01/22 02/01/23  Charlott Rakes, MD  fluticasone (FLONASE) 50 MCG/ACT nasal spray Place 2 sprays into both nostrils daily. 03/04/22   [provider]  ibuprofen (ADVIL) 600 MG tablet Take 1 tablet (600 mg total) by mouth every 8 (eight) hours as needed. 02/08/21   Gildardo Pounds, NP  lisinopril (ZESTRIL) 20 MG tablet TAKE 1 TABLET (20 MG TOTAL) BY MOUTH DAILY. 02/01/22 02/01/23  Charlott Rakes, MD  metoprolol tartrate (LOPRESSOR) 25 MG tablet Take 1 tablet (25 mg total) by mouth 2 (two) times daily. 03/21/22 04/20/22  Tacy Learn, PA-C  Misc. Devices MISC Please provide patient with insurance approved supplies for her CPAP: tubing, chin strap and supplies and filters. 11/17/18   Gildardo Pounds, NP  mometasone-formoterol (DULERA) 100-5 MCG/ACT AERO Inhale 2 puffs into the lungs 2 (two) times daily. 01/22/22   Gildardo Pounds, NP  Multiple Vitamin (MULTIVITAMIN) capsule Take by mouth.    [provider]  omeprazole (PRILOSEC) 20 MG capsule Take 20 mg by mouth 2 (two) times daily. 03/04/22   [provider]  pantoprazole (PROTONIX) 20 MG tablet Take 1 tablet (20 mg total) by mouth 2 (two) times daily. 02/01/22 05/02/22  Charlott Rakes, MD  traMADol (ULTRAM) 50 MG tablet Take 1 tablet (50 mg total) by mouth every 6 (six) hours as needed. 03/21/21   Kirsteins, Luanna Salk, MD  traZODone (DESYREL) 100 MG tablet Take 0.5-1 tablets (50-100 mg total) by mouth at bedtime. 01/08/22   Gildardo Pounds, NP  venlafaxine XR (EFFEXOR-XR) 150 MG 24 hr capsule Take 1 capsule (150 mg total) by mouth every morning. 02/01/22   Charlott Rakes, MD  amitriptyline (ELAVIL) 75 MG tablet Take 1 tablet (75 mg total) by mouth at bedtime. 10/06/18 07/20/20  Gildardo Pounds, NP  Cetirizine HCl 10 MG CAPS Take 1 capsule (10 mg total) by mouth daily for 10 days. 02/19/19 07/20/20  Wieters, Hallie C, PA-C  DULoxetine (CYMBALTA) 30 MG capsule Take 1 capsule (30 mg total) by mouth daily. 09/22/20 01/03/21  Gildardo Pounds, NP  sucralfate (CARAFATE) 1 g tablet Take 1 tablet (1 g total) by mouth 3 (three) times daily with meals for 14 days. 03/15/19 07/20/20  Couture, Cortni S, PA-C      Allergies    Iodinated contrast media    Review of Systems   Review of  Systems  Cardiovascular:  Positive for chest pain.    Physical Exam Updated Vital Signs BP (!) 142/80   Pulse (!) 55   Temp 98.3 F (36.8 C)   Resp 10   Ht '5\' 2"'$  (1.575 m)   Wt 77.1 kg   SpO2 99%   BMI 31.09 kg/m  Physical Exam Vitals and nursing note reviewed.  Constitutional:      Comments: Unable to complete, as the patient left AMA prior to exam     ED Results / Procedures / Treatments   Labs (all labs ordered are listed, but only abnormal results are displayed) Labs Reviewed  BASIC METABOLIC PANEL - Abnormal; Notable for the following components:      Result Value   CO2 20 (*)    Glucose, Bld 128 (*)    BUN 22 (*)    All other  components within normal limits  CBC  TROPONIN I (HIGH SENSITIVITY)    EKG EKG Interpretation  Date/Time:  Sunday March 31 2022 17:50:02 EDT Ventricular Rate:  65 PR Interval:  138 QRS Duration: 78 QT Interval:  396 QTC Calculation: 411 R Axis:   47 Text Interpretation: Sinus rhythm with marked sinus arrhythmia Otherwise normal ECG When compared with ECG of 21-Mar-2022 09:47, PREVIOUS ECG IS PRESENT No significant change since last tracing Confirmed by Blanchie Dessert [phone]) on 03/31/2022 7:22:12 PM  Radiology DG Chest 2 View  Result Date: 03/31/2022 CLINICAL DATA:  Chest pain. EXAM: CHEST - 2 VIEW COMPARISON:  03/21/2022.  CT, 09/22/2016. FINDINGS: Normal heart, mediastinum and hila. Clear lungs.  No pleural effusion or pneumothorax. Skeletal structures are intact. IMPRESSION: No active cardiopulmonary disease. Electronically Signed   By: Lajean Manes M.D.   On: 03/31/2022 18:45    Procedures Procedures    Medications Ordered in ED Medications - No data to display  ED Course/ Medical Decision Making/ A&P                           Medical Decision Making Amount and/or Complexity of Data Reviewed External Data Reviewed: notes. Labs: ordered. Decision-making details documented in ED Course. Radiology: ordered and independent interpretation performed. Decision-making details documented in ED Course. ECG/medicine tests: ordered and independent interpretation performed. Decision-making details documented in ED Course.  Risk OTC drugs. Prescription drug management.   56 year old female presenting with chest pain and possible palpitations.  Recently seen for short runs of SVT.  Was concerned she may have had another run of SVT.  12-lead EKG without any significant changes since last tracing, still indicating sinus arrhythmia with a ventricular rate of 65 bpm.  I personally ordered, interpreted, and reviewed labs which were overall unremarkable.  Troponins negative.  I also  personally ordered, interpreted, and reviewed image findings-CXR negative for acute cardiopulmonary pathology.  Low suspicion of pneumonia or pneumothorax, as these are also not consistent with reported clinical picture.  Hx of GERD.  Low suspicion of ACS based on reported history, previous records, and labs/image findings.  Informed by the nurse that she felt better and believed her symptoms were due to GERD rather than cardiac etiology.  Patient wished to leave without being seen.  I signed up for the patient to provide care.  Before I was able to initiate care for the patient, she left AGAINST MEDICAL ADVICE.  I discussed the patient and their case with my attending, Dr. Maryan Rued, who is aware that the patient is left AGAINST MEDICAL ADVICE.  This chart was dictated using voice recognition software.  Despite best efforts to proofread,  errors can occur which can change the documentation meaning.         Final Clinical Impression(s) / ED Diagnoses Final diagnoses:  Chest pain, unspecified type    Rx / DC Orders ED Discharge Orders     None         Candace Cruise 00/37/94 1154    Blanchie Dessert, MD 04/04/22 1148

## 2022-04-02 ENCOUNTER — Encounter: Payer: Self-pay | Admitting: Gastroenterology

## 2022-04-02 ENCOUNTER — Ambulatory Visit: Payer: Managed Care, Other (non HMO) | Admitting: Gastroenterology

## 2022-04-02 VITALS — BP 126/80 | HR 62 | Ht 62.0 in | Wt 168.0 lb

## 2022-04-02 DIAGNOSIS — R140 Abdominal distension (gaseous): Secondary | ICD-10-CM | POA: Diagnosis not present

## 2022-04-02 DIAGNOSIS — R059 Cough, unspecified: Secondary | ICD-10-CM

## 2022-04-02 MED ORDER — OMEPRAZOLE 40 MG PO CPDR: 40.0000 mg | DELAYED_RELEASE_CAPSULE | Freq: Two times a day (BID) | ORAL | 3 refills | Status: DC

## 2022-04-02 NOTE — Progress Notes (Signed)
Referring Provider: Gildardo Pounds, NP Primary Care Physician:  System, Provider Not In  Reason for Consultation:  Cough, abdominal pain, bloating   IMPRESSION:  Cough when supine    - Likely due to reflux    - worsened when new PCP switched PPI and lowered the dose Chest pain with recent SVT    - awaiting cardiac evaluation    - must considered referred esophageal sources if cardiac evaluation is negative Bloating, intermittent    - etiology unclear Altered appetite History of colon polyps    - TA and SSP on colonoscopy 2020    - Surveillance colonoscopy due 2025.  PLAN: - Reflux lifestyle modifications - Increase omeprazole to 40 mg BID - EGD after cardiology clearance - Low threshold for abdominal imaging - Avoid NSAIDs  HPI: Lauren [name] is a 56 y.o. female referred by Dr. Raul Del for further evaluation of cough and abdominal pain. The history is obtained through the patient and review of her electronic health record. She has a history of hyperlipidemia, hypertension, tobacco abuse, prediabetes, depression, anxiety, OSA-CPAP although her mask isn't fitting well, and a history of SVT with recent ED visit for chest pain and rate in the 160s treated with adenosine by EMS. She has an appointment with cardiology tomorrow.   Reflux symptoms despite omeprazole 40 mg QD predominantly presenting with heartburn, brash, and cough when supine.  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 She avoid late night eating but sometimes reclines soon after eating. She often only has time to eat one meal daily and thinks this may be contributing.  Intermittent bloating not related to eating x 2 months. Symptoms fluctuate dramatically from day to day.   Lower abdominal pain x 2 weeks. Symptoms are not as serious or frequent.   Intermittent diarrhea with urgency but without accidents.  Appetite was low but it recently improved 2 weeks ago. Weight fluctuates.    Dr. Raul Del thought it could be related to reflux. Despite prescription medications, she will have nocturnal coughing.   Smokes 4-5 menthol cigarettes daily. Denies marijuana, alcohol, or street drugs.   Ibuprofen for sciatica in the past - but has needed it since her injection She was previously on high dose PPI. When she switched PCP they reduced the dose and the medication.   Sister has intermittent abdominal distention. Mother with long history of GI problems.  There is no known family history of colon cancer or polyps. No family history of stomach cancer or other GI malignancy. No family history of inflammatory bowel disease or celiac.   Colonoscopy 05/04/2019 showed 3 small polyps including a tubular adenoma, sessile serrate polyp, and hyperplastic polyp.   No recent abdominal imaging. No prior upper endoscopy.   Labs 03/31/22 include glucose 128, normal CBC, negative troponin   Past Medical History:  Diagnosis Date   Anxiety    Arthritis    Depression    GERD (gastroesophageal reflux disease)    Hypertension    Prediabetes    Sleep apnea    Past hx - no cpap now    SVT (supraventricular tachycardia) ([place])     Past Surgical History:  Procedure Laterality Date   ABDOMINAL HYSTERECTOMY     BACK SURGERY     CESAREAN SECTION     COLONOSCOPY     HAND SURGERY     NECK SURGERY  2002   plate in her neck   Current Outpatient Medications  Medication  Sig Dispense Refill   albuterol (PROAIR HFA) 108 (90 Base) MCG/ACT inhaler Inhale 1-2 puffs into the lungs every 6 (six) hours as needed for wheezing or shortness of breath. 8.5 g 2   aspirin EC 81 MG tablet Take 81 mg by mouth daily. Swallow whole.     atorvastatin (LIPITOR) 10 MG tablet TAKE 1 TABLET (10 MG TOTAL) BY MOUTH DAILY. 30 tablet 1   fluticasone (FLONASE) 50 MCG/ACT nasal spray Place 2 sprays into both nostrils daily.     ibuprofen (ADVIL) 600 MG tablet Take 1 tablet (600 mg total) by mouth every 8 (eight) hours as  needed. 60 tablet 1   lisinopril (ZESTRIL) 20 MG tablet TAKE 1 TABLET (20 MG TOTAL) BY MOUTH DAILY. 30 tablet 1   metoprolol tartrate (LOPRESSOR) 25 MG tablet Take 1 tablet (25 mg total) by mouth 2 (two) times daily. 60 tablet 0   Misc. Devices MISC Please provide patient with insurance approved supplies for her CPAP: tubing, chin strap and supplies and filters. 1 each 0   Multiple Vitamin (MULTIVITAMIN) capsule Take by mouth.     omeprazole (PRILOSEC) 40 MG capsule Take 1 capsule (40 mg total) by mouth 2 (two) times daily before a meal. 180 capsule 3   traZODone (DESYREL) 100 MG tablet Take 0.5-1 tablets (50-100 mg total) by mouth at bedtime. 90 tablet 1   venlafaxine XR (EFFEXOR-XR) 150 MG 24 hr capsule Take 1 capsule (150 mg total) by mouth every morning. 30 capsule 1   Current Facility-Administered Medications  Medication Dose Route Frequency Provider Last Rate Last Admin   methylPREDNISolone acetate (DEPO-MEDROL) injection 80 mg  80 mg Other Once Magnus Sinning, MD        Allergies as of 04/02/2022 - Review Complete 04/02/2022  Allergen Reaction Noted   Iodinated contrast media Nausea Only 02/02/2020    Family History  Problem Relation Age of Onset   Diabetes Father    Healthy Mother    Colon cancer Neg Hx    Colon polyps Neg Hx    Esophageal cancer Neg Hx    Rectal cancer Neg Hx    Stomach cancer Neg Hx       Physical Exam: General:   Alert,  well-nourished, pleasant and cooperative in NAD Head:  Normocephalic and atraumatic. Eyes:  Sclera clear, no icterus.   Conjunctiva pink. Ears:  Normal auditory acuity. Nose:  No deformity, discharge,  or lesions. Mouth:  No deformity or lesions.   Neck:  Supple; no masses or thyromegaly. Lungs:  Clear throughout to auscultation.   No wheezes. Heart:  Regular rate and rhythm; no murmurs. Abdomen:  Soft, nontender, nondistended, normal bowel sounds, no rebound or guarding. No hepatosplenomegaly.   Rectal:  Deferred  Msk:   Symmetrical. No boney deformities LAD: No inguinal or umbilical LAD Extremities:  No clubbing or edema. Neurologic:  Alert and  oriented x4;  grossly nonfocal Skin:  Intact without significant lesions or rashes. Psych:  Alert and cooperative. Normal mood and affect.   Lab Results: Recent Labs    03/31/22 1814  WBC 9.1  HGB 12.5  HCT 38.5  PLT 391   BMET Recent Labs    03/31/22 1814  NA 137  K 3.6  CL 106  CO2 20*  GLUCOSE 128*  BUN 22*  CREATININE 0.65  CALCIUM 9.7      Adar Rase L. Tarri Glenn, MD, MPH 04/02/2022, 7:53 PM

## 2022-04-02 NOTE — Progress Notes (Signed)
Cardiology Office Note:   Date:  04/04/2022  NAME:  Lauren [name]    MRN: [id number] DOB:  1966-08-26   PCP:  System, Provider Not In  Cardiologist:  None  Electrophysiologist:  None   Referring MD: Tacy Learn, PA-C   Chief Complaint  Patient presents with   Chest Pain    History of Present Illness:   Lauren [name] is a 56 y.o. female with a hx of hypertension, SVT who is being seen today for the evaluation of SVT at the request of Tacy Learn, PA-C.  She reports 2 episodes of SVT.  Seen in the emergency room on 03/21/2022.  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  She did receive adenosine and this improved it.  She reports a second episode that did not result in hospital evaluation.  She reports waking up in the middle of the night with rapid heartbeat sensation.  She felt dizzy.  She reports she felt like she was going to pass out but did not.  She reports a second episode of similar quality and character.  She has OSA and reports her machine has not been up to par.  She apparently was told she no longer needed it but her pulmonologist that she did.  She reports also chest tightness that occurs daily.  She has acid reflux.  GI is going to pursue an EGD but would like cardiac clearance.  Her EKG demonstrates sinus bradycardia with no acute ischemic changes or evidence of infarction.  She does smoke every day.  Half pack per day.  20 years.  She has a strong family history of heart disease in her father and sister.  Cholesterol level acceptable.  Blood sugars have been elevated she needs an A1c.  Her blood pressure is 114/72.  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  She reports working 6 days/week on her feet all day.  She reports the chest tightness actually gets better with activity.  No wheezing on exam.  We discussed her diagnosis of SVT.  Needs to see electrophysiology.  She needs an echo as well as coronary CTA as well.  We also discussed stopping smoking.  Overall  she seems to be doing better.  She has been alarmed by her rapid heartbeat sensation.  She needs further evaluation of this.  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 Swallow whole.   atorvastatin (LIPITOR) 10 MG tablet TAKE 1 TABLET (10 MG TOTAL) BY MOUTH DAILY.   fluticasone (FLONASE) 50 MCG/ACT nasal spray Place 2 sprays into both nostrils daily.   lisinopril (ZESTRIL)  20 MG tablet TAKE 1 TABLET (20 MG TOTAL) BY MOUTH DAILY.   metoprolol tartrate (LOPRESSOR) 25 MG tablet Take 1 tablet (25 mg total) by mouth 2 (two) times daily.   Misc. Devices MISC Please provide patient with insurance approved supplies for her CPAP: tubing, chin strap and supplies and filters.   Multiple Vitamin (MULTIVITAMIN) capsule Take by mouth.   omeprazole (PRILOSEC) 40 MG capsule Take 1 capsule (40 mg total) by mouth 2 (two) times daily before a meal.   venlafaxine XR (EFFEXOR-XR) 150 MG 24 hr capsule Take 1 capsule (150 mg total) by mouth every morning.   Current Facility-Administered Medications for the 04/04/22 encounter (Office Visit) with Audie Box, Cassie Freer, MD  Medication   methylPREDNISolone acetate (DEPO-MEDROL) injection 80 mg     Allergies:    Iodinated contrast media   Social History: Social History   Socioeconomic History   Marital status: Divorced     Spouse name: Not on file   Number of children: 1   Years of education: Not on file   Highest education level: Not on file  Occupational History   Occupation: Barrister's clerk - Furniture conservator/restorer  Tobacco Use   Smoking status: Every Day    Packs/day: 0.50    Years: 20.00    Total pack years: 10.00    Types: Cigarettes    Passive exposure: Current   Smokeless tobacco: Never   Tobacco comments:    0.5 or less   Vaping Use   Vaping Use: Never used  Substance and Sexual Activity   Alcohol use: No   Drug use: No   Sexual activity: Not Currently    Birth control/protection: None  Other Topics Concern   Not on file  Social History Narrative   Not on file   Social Determinants of Health   Financial Resource Strain: Not on file  Food Insecurity: Not on file  Transportation Needs: Not on file  Physical Activity: Not on file  Stress: Not on file  Social Connections: Not on file     Family History: The patient's family history includes Diabetes in her father; Healthy in her mother; Heart attack in her father and sister. There is no history of Colon cancer, Colon polyps, Esophageal cancer, Rectal cancer, or Stomach cancer.  ROS:   All other ROS reviewed and negative. Pertinent positives noted in the HPI.     EKGs/Labs/Other Studies Reviewed:   The following studies were personally reviewed by me today:  EKG:  EKG is ordered today.  The ekg ordered today demonstrates SB 47 bpm, and was personally reviewed by me.   Recent Labs: 03/21/2022: Magnesium 1.9 03/31/2022: BUN 22; Creatinine, Ser 0.65; Hemoglobin 12.5; Platelets 391; Potassium 3.6; Sodium 137   Recent Lipid Panel    Component Value Date/Time   CHOL 177 10/31/2020 1455   TRIG 76 10/31/2020 1455   HDL 57 10/31/2020 1455   CHOLHDL 3.1 10/31/2020 1455   LDLCALC 106 (H) 10/31/2020 1455    Physical Exam:   VS:  BP 114/72 (BP Location: Left Arm, Patient Position: Sitting, Cuff Size: Normal)   Pulse (!) 47   Ht '5\' 2"'$   (1.575 m)   Wt 169 lb 12.8 oz (77 kg)   SpO2 97%   BMI 31.06 kg/m    Wt Readings from Last 3 Encounters:  04/04/22 169 lb 12.8 oz (77 kg)  04/02/22 168 lb (76.2 kg)  03/31/22 170 lb (77.1 kg)    General: Well nourished, well developed, in  no acute distress Head: Atraumatic, normal size  Eyes: PEERLA, EOMI  Neck: Supple, no JVD Endocrine: No thryomegaly Cardiac: Normal S1, S2; RRR; no murmurs, rubs, or gallops Lungs: Clear to auscultation bilaterally, no wheezing, rhonchi or rales  Abd: Soft, nontender, no hepatomegaly  Ext: No edema, pulses 2+ Musculoskeletal: No deformities, BUE and BLE strength normal and equal Skin: Warm and dry, no rashes   Neuro: Alert and oriented to person, place, time, and situation, CNII-XII grossly intact, no focal deficits  Psych: Normal mood and affect   ASSESSMENT:   Lauren [name] is a 56 y.o. female who presents for the following: 1. Precordial pain   2. SVT (supraventricular tachycardia) ([place])   3. Tobacco abuse   4. Primary hypertension     PLAN:   1. Precordial pain -She reports tightness in her chest that occurs daily.  Symptoms are actually improved by exertion.  EKG is nonischemic.  CVD risk factors include smoking and strong family history.  Overall I believe this is likely acid reflux but we will proceed with coronary CTA to exclude CAD.  She does not need metoprolol.  She is currently on this as a standing medication for SVT.  Heart rate is in the high 40s.  We will continue this.  BMP in the hospital normal.  2. SVT (supraventricular tachycardia) ([place]) -She has had 2 episodes of recurrent SVT requiring adenosine.  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  We will continue metoprolol for now.  She needs an echo and TSH.  She also needs an A1c.  We will exclude CAD with coronary CTA.  We discussed vagal maneuvers.  Overall needs electrophysiology evaluation for ablation.  We will refer her today.  3. Tobacco  abuse -Half pack per day for 20 years.  4 minutes of smoking cessation counseling provided.  4. Primary hypertension -Blood pressure well controlled.  A1c and TSH as above.  Disposition: Return in about 3 months (around 07/05/2022).  Medication Adjustments/Labs and Tests Ordered: Current medicines are reviewed at length with the patient today.  Concerns regarding medicines are outlined above.  Orders Placed This Encounter  Procedures   CT CORONARY MORPH W/CTA COR W/SCORE W/CA W/CM &/OR WO/CM   TSH   Hemoglobin A1c   Ambulatory referral to Cardiac Electrophysiology   EKG 12-Lead   ECHOCARDIOGRAM COMPLETE   No orders of the defined types were placed in this encounter.   Patient Instructions  Medication Instructions:  The current medical regimen is effective;  continue present plan and medications.  *If you need a refill on your cardiac medications before your next appointment, please call your pharmacy*   Lab Work: TSH, A1C today   If you have labs (blood work) drawn today and your tests are completely normal, you will receive your results only by: [place] (if you have MyChart) OR A paper copy in the mail If you have any lab test that is abnormal or we need to change your treatment, we will call you to review the results.   Testing/Procedures: Your physician has requested that you have cardiac CT. Cardiac computed tomography (CT) is a painless test that uses an x-ray machine to take clear, detailed pictures of your heart. For further information please visit HugeFiesta.[email or web address]. Please follow instruction sheet as given.  Echocardiogram - Your physician has requested that you have an echocardiogram. Echocardiography is a painless test that uses sound waves to create images of your heart. It provides your doctor with  information about the size and shape of your heart and how well your heart's chambers and valves are working. This procedure takes approximately one hour.  There are no restrictions for this procedure.     Follow-Up: At [facility], you and your health needs are our priority.  As part of our continuing mission to provide you with exceptional heart care, we have created designated Provider Care Teams.  These Care Teams include your primary Cardiologist (physician) and Advanced Practice Providers (APPs -  Physician Assistants and Nurse Practitioners) who all work together to provide you with the care you need, when you need it.  We recommend signing up for the patient portal called "MyChart".  Sign up information is provided on this After Visit Summary.  MyChart is used to connect with patients for Virtual Visits (Telemedicine).  Patients are able to view lab/test results, encounter notes, upcoming appointments, etc.  Non-urgent messages can be sent to your provider as well.   To learn more about what you can do with MyChart, go to [email or web address].    Your next appointment:   3 month(s)  The format for your next appointment:   In Person  Provider:   Eleonore Chiquito, MD   Other Instructions  Referral to EP [facility]) they will contact you for an appointment.     Your cardiac CT will be scheduled at one of the below locations:   [facility] [address] [place], [place] [address] [phone]  If scheduled at [facility], please arrive at the [facility] (Entrance C2) of [facility] 30 minutes prior to test start time. You can use the FREE valet parking offered at entrance C (encouraged to control the heart rate for the test)  Proceed to the [facility] Radiology Department (first floor) to check-in and test prep.  All radiology patients and guests should use entrance C2 at [facility], accessed from [facility], even though the hospital's physical address listed is [address]      Please follow these instructions carefully (unless  otherwise directed):  On the Night Before the Test: Be sure to Drink plenty of water. Do not consume any caffeinated/decaffeinated beverages or chocolate 12 hours prior to your test. Do not take any antihistamines 12 hours prior to your test.  On the Day of the Test: Drink plenty of water until 1 hour prior to the test. Do not eat any food 4 hours prior to the test. You may take your regular medications prior to the test.  Take metoprolol (Lopressor) two hours prior to test. HOLD Furosemide/Hydrochlorothiazide morning of the test. FEMALES- please wear underwire-free bra if available, avoid dresses & tight clothing       After the Test: Drink plenty of water. After receiving IV contrast, you may experience a mild flushed feeling. This is normal. On occasion, you may experience a mild rash up to 24 hours after the test. This is not dangerous. If this occurs, you can take Benadryl 25 mg and increase your fluid intake. If you experience trouble breathing, this can be serious. If it is severe call 911 IMMEDIATELY. If it is mild, please call our office. If you take any of these medications: Glipizide/Metformin, Avandament, Glucavance, please do not take 48 hours after completing test unless otherwise instructed.  We will call to schedule your test 2-4 weeks out understanding that some insurance companies will need an authorization prior to the service being performed.  For non-scheduling related questions, please contact the cardiac imaging nurse navigator should you have any questions/concerns: Marchia Bond, Cardiac Imaging Nurse Navigator Gordy Clement, Cardiac Imaging Nurse Navigator [facility] Direct Office Dial: [phone]   For scheduling needs, including cancellations and rescheduling, please call [place], [phone].           Signed, Addison Naegeli. Audie Box, MD, [place]  [address] [place], [place]  [address] [phone]  04/04/2022 10:01 AM

## 2022-04-02 NOTE — Patient Instructions (Signed)
It was my pleasure to provide care to you today. Based on our discussion, I am providing you with my recommendations below:  RECOMMENDATION(S):   Increase Omeprazole to 40 mg twice daily 30-60 minutes before breakfast and dinner.  ENDOSCOPY:   You have been scheduled for an endoscopy. Please follow written instructions given to you at your visit today.  INHALERS:   If you use inhalers (even only as needed), please bring them with you on the day of your procedure.  FOLLOW UP:  After your procedure, you will receive a call from my office staff regarding my recommendation for follow up.  BMI:  If you are age 45 or older, your body mass index should be between 23-30. Your Body mass index is 30.73 kg/m. If this is out of the aforementioned range listed, please consider follow up with your Primary Care Provider.  If you are age 17 or younger, your body mass index should be between 19-25. Your Body mass index is 30.73 kg/m. If this is out of the aformentioned range listed, please consider follow up with your Primary Care Provider.   MY CHART:  The [facility] providers would like to encourage you to use [facility] to communicate with providers for non-urgent requests or questions.  Due to long hold times on the telephone, sending your provider a message by [facility] may be a faster and more efficient way to get a response.  Please allow 48 business hours for a response.  Please remember that this is for non-urgent requests.   Thank you for trusting me with your gastrointestinal care!    Thornton Park, MD, MPH

## 2022-04-04 ENCOUNTER — Ambulatory Visit (INDEPENDENT_AMBULATORY_CARE_PROVIDER_SITE_OTHER): Payer: Managed Care, Other (non HMO) | Admitting: Cardiovascular Disease

## 2022-04-04 ENCOUNTER — Encounter: Payer: Self-pay | Admitting: Cardiovascular Disease

## 2022-04-04 VITALS — BP 114/72 | HR 47 | Ht 62.0 in | Wt 169.8 lb

## 2022-04-04 DIAGNOSIS — Z720 Tobacco use: Secondary | ICD-10-CM

## 2022-04-04 DIAGNOSIS — I10 Essential (primary) hypertension: Secondary | ICD-10-CM

## 2022-04-04 DIAGNOSIS — R072 Precordial pain: Secondary | ICD-10-CM | POA: Diagnosis not present

## 2022-04-04 DIAGNOSIS — F17210 Nicotine dependence, cigarettes, uncomplicated: Secondary | ICD-10-CM | POA: Diagnosis not present

## 2022-04-04 DIAGNOSIS — I471 Supraventricular tachycardia: Secondary | ICD-10-CM | POA: Diagnosis not present

## 2022-04-04 LAB — HEMOGLOBIN A1C
Est. average glucose Bld gHb Est-mCnc: 131 mg/dL
Hgb A1c MFr Bld: 6.2 % — ABNORMAL HIGH (ref 4.8–5.6)

## 2022-04-04 LAB — TSH: TSH: 2.72 u[IU]/mL (ref 0.450–4.500)

## 2022-04-04 NOTE — Patient Instructions (Signed)
Medication Instructions:  The current medical regimen is effective;  continue present plan and medications.  *If you need a refill on your cardiac medications before your next appointment, please call your pharmacy*   Lab Work: TSH, A1C today   If you have labs (blood work) drawn today and your tests are completely normal, you will receive your results only by: [place] (if you have MyChart) OR A paper copy in the mail If you have any lab test that is abnormal or we need to change your treatment, we will call you to review the results.   Testing/Procedures: Your physician has requested that you have cardiac CT. Cardiac computed tomography (CT) is a painless test that uses an x-ray machine to take clear, detailed pictures of your heart. For further information please visit [email or web address]. Please follow instruction sheet as given.  Echocardiogram - Your physician has requested that you have an echocardiogram. Echocardiography is a painless test that uses sound waves to create images of your heart. It provides your doctor with information about the size and shape of your heart and how well your heart's chambers and valves are working. This procedure takes approximately one hour. There are no restrictions for this procedure.     Follow-Up: At [facility], you and your health needs are our priority.  As part of our continuing mission to provide you with exceptional heart care, we have created designated Provider Care Teams.  These Care Teams include your primary Cardiologist (physician) and Advanced Practice Providers (APPs -  Physician Assistants and Nurse Practitioners) who all work together to provide you with the care you need, when you need it.  We recommend signing up for the patient portal called "MyChart".  Sign up information is provided on this After Visit Summary.  MyChart is used to connect with patients for Virtual Visits (Telemedicine).  Patients are able to view  lab/test results, encounter notes, upcoming appointments, etc.  Non-urgent messages can be sent to your provider as well.   To learn more about what you can do with MyChart, go to [email or web address].    Your next appointment:   3 month(s)  The format for your next appointment:   In Person  Provider:   Eleonore Chiquito, MD   Other Instructions  Referral to EP [facility]) they will contact you for an appointment.     Your cardiac CT will be scheduled at one of the below locations:   [facility] [address] [place], [place] [address] [phone]  If scheduled at [facility], please arrive at the [facility] and Children's Entrance (Entrance C2) of [facility] 30 minutes prior to test start time. You can use the FREE valet parking offered at entrance C (encouraged to control the heart rate for the test)  Proceed to the [facility] Radiology Department (first floor) to check-in and test prep.  All radiology patients and guests should use entrance C2 at [facility], accessed from [facility], even though the hospital's physical address listed is [address]      Please follow these instructions carefully (unless otherwise directed):  On the Night Before the Test: Be sure to Drink plenty of water. Do not consume any caffeinated/decaffeinated beverages or chocolate 12 hours prior to your test. Do not take any antihistamines 12 hours prior to your test.  On the Day of the Test: Drink plenty of water until 1 hour prior to the test. Do not eat any food  4 hours prior to the test. You may take your regular medications prior to the test.  Take metoprolol (Lopressor) two hours prior to test. HOLD Furosemide/Hydrochlorothiazide morning of the test. FEMALES- please wear underwire-free bra if available, avoid dresses & tight clothing       After the Test: Drink plenty of water. After receiving IV contrast, you may  experience a mild flushed feeling. This is normal. On occasion, you may experience a mild rash up to 24 hours after the test. This is not dangerous. If this occurs, you can take Benadryl 25 mg and increase your fluid intake. If you experience trouble breathing, this can be serious. If it is severe call 911 IMMEDIATELY. If it is mild, please call our office. If you take any of these medications: Glipizide/Metformin, Avandament, Glucavance, please do not take 48 hours after completing test unless otherwise instructed.  We will call to schedule your test 2-4 weeks out understanding that some insurance companies will need an authorization prior to the service being performed.   For non-scheduling related questions, please contact the cardiac imaging nurse navigator should you have any questions/concerns: Marchia Bond, Cardiac Imaging Nurse Navigator Gordy Clement, Cardiac Imaging Nurse Navigator [place] [facility] Direct Office Dial: [phone]   For scheduling needs, including cancellations and rescheduling, please call [place], [phone].

## 2022-04-08 ENCOUNTER — Ambulatory Visit (HOSPITAL_COMMUNITY): Payer: Managed Care, Other (non HMO) | Attending: Internal Medicine

## 2022-04-08 ENCOUNTER — Telehealth: Payer: Self-pay | Admitting: Gastroenterology

## 2022-04-08 DIAGNOSIS — I471 Supraventricular tachycardia: Secondary | ICD-10-CM | POA: Diagnosis present

## 2022-04-08 LAB — ECHOCARDIOGRAM COMPLETE
Area-P 1/2: 3.1 cm2
S' Lateral: 2 cm

## 2022-04-08 MED ORDER — PERFLUTREN LIPID MICROSPHERE
1.0000 mL | INTRAVENOUS | Status: AC | PRN
  Administered 2022-04-08: 2 mL via INTRAVENOUS

## 2022-04-08 NOTE — Telephone Encounter (Signed)
Inbound call from patient stating she is scheduled to have an EGD tomorrow at 2:00 and has been waiting for cardiac clearance. Patient stated she has not heard anything. Patient is requesting a call back to discuss. Please advise.

## 2022-04-08 NOTE — Telephone Encounter (Signed)
Spoke with patient and made her aware that EGD will need to be postponed until she has received cardiac clearance. Patient verbalized understanding. Will route to Dr. Tarri Glenn to make her aware.

## 2022-04-08 NOTE — Telephone Encounter (Signed)
Patient was seen in the Cardiology Clinic on 04/04/2022.  Note reviewed.  Recommended coronary CTA, TTE, TSH (normal), A1c (6.2%).  TTE scheduled to be done today.  CTA is scheduled on 04/26/2022.  Based on active work-up in the Cardiology Clinic and last note by Dr. Tarri Glenn, recommend postponing EGD until completion of Cardiology work-up and receipt of clearance to proceed with EGD.

## 2022-04-09 ENCOUNTER — Encounter: Payer: Managed Care, Other (non HMO) | Admitting: Gastroenterology

## 2022-04-09 NOTE — Progress Notes (Signed)
Lauren [name] - 56 y.o. female MRN [id number]  Date of birth: [birth year]  Office Visit Note: Visit Date: 03/25/2022 PCP: System, Provider Not In Referred by: Gildardo Pounds, NP  Subjective: Chief Complaint  Patient presents with   Lower Back - Pain   Left Leg - Pain   HPI:  Lauren [name] is a 56 y.o. female who comes in today at the request of Dr. Rodell Perna for planned Left L4-5 Lumbar Transforaminal epidural steroid injection with fluoroscopic guidance.  The patient has failed conservative care including home exercise, medications, time and activity modification.  This injection will be diagnostic and hopefully therapeutic.  Please see requesting physician notes for further details and justification.   ROS Otherwise per HPI.  Assessment & Plan: Visit Diagnoses:    ICD-10-CM   1. Lumbar radiculopathy  M54.16 Epidural Steroid injection    XR C-ARM NO REPORT    methylPREDNISolone acetate (DEPO-MEDROL) injection 80 mg      Plan: No additional findings.   Meds & Orders:  Meds ordered this encounter  Medications   methylPREDNISolone acetate (DEPO-MEDROL) injection 80 mg    Orders Placed This Encounter  Procedures   XR C-ARM NO REPORT   Epidural Steroid injection    Follow-up: Return for visit to requesting provider as needed.   Procedures: No procedures performed  Lumbosacral Transforaminal Epidural Steroid Injection - Sub-Pedicular Approach with Fluoroscopic Guidance  Patient: Lauren [name]      Date of Birth: 08-Mar-1966 MRN: [id number] PCP: System, Provider Not In      Visit Date: 03/25/2022   Universal Protocol:    Date/Time: 03/25/2022  Consent Given By: the patient  Position: PRONE  Additional Comments: Vital signs were monitored before and after the procedure. Patient was prepped and draped in the usual sterile fashion. The correct patient, procedure, and site was verified.   Injection Procedure Details:   Procedure diagnoses: Lumbar  radiculopathy [M54.16]    Meds Administered:  Meds ordered this encounter  Medications   methylPREDNISolone acetate (DEPO-MEDROL) injection 80 mg    Laterality: Left  Location/Site: L4  Needle:5.0 in., 22 ga.  Short bevel or Quincke spinal needle  Needle Placement: Transforaminal  Findings:    -Comments: Excellent flow of contrast along the nerve, nerve root and into the epidural space.  Procedure Details: After squaring off the end-plates to get a true AP view, the C-arm was positioned so that an oblique view of the foramen as noted above was visualized. The target area is just inferior to the "nose of the scotty dog" or sub pedicular. The soft tissues overlying this structure were infiltrated with 2-3 ml. of 1% Lidocaine without Epinephrine.  The spinal needle was inserted toward the target using a "trajectory" view along the fluoroscope beam.  Under AP and lateral visualization, the needle was advanced so it did not puncture dura and was located close the 6 O'Clock position of the pedical in AP tracterory. Biplanar projections were used to confirm position. Aspiration was confirmed to be negative for CSF and/or blood. A 1-2 ml. volume of Isovue-250 was injected and flow of contrast was noted at each level. Radiographs were obtained for documentation purposes.   After attaining the desired flow of contrast documented above, a 0.5 to 1.0 ml test dose of 0.25% Marcaine was injected into each respective transforaminal space.  The patient was observed for 90 seconds post injection.  After no sensory deficits were reported, and normal lower extremity motor function was  noted,   the above injectate was administered so that equal amounts of the injectate were placed at each foramen (level) into the transforaminal epidural space.   Additional Comments:  The patient tolerated the procedure well Dressing: 2 x 2 sterile gauze and Band-Aid    Post-procedure details: Patient was observed  during the procedure. Post-procedure instructions were reviewed.  Patient left the clinic in stable condition.    Clinical History: MRI LUMBAR SPINE WITHOUT AND WITH CONTRAST   TECHNIQUE: Multiplanar and multiecho pulse sequences of the lumbar spine were obtained without and with intravenous contrast.   CONTRAST:  26m MULTIHANCE GADOBENATE DIMEGLUMINE 529 MG/ML IV SOLN   COMPARISON:  Lumbar spine radiographs 07/24/2021   FINDINGS: Segmentation:  5 lumbar segments.   Alignment:  Normal   Vertebrae:  Normal bone marrow.  Negative for fracture or mass.   Conus medullaris and cauda equina: Conus extends to the L1-2 level. Conus and cauda equina appear normal.   Paraspinal and other soft tissues: Negative for paraspinous mass or fluid collection.   Disc levels:   L1-2: Negative   L2-3: Mild disc bulging. Negative for neural impingement or stenosis.   L3-4: Left laminotomy. Shallow left paracentral disc protrusion causing mild subarticular stenosis on the left. Spinal canal adequate in diameter.   L4-5: Mild to moderate central disc protrusion indenting the thecal sac and causing mild spinal stenosis. Mild facet hypertrophy. Mild subarticular and foraminal stenosis bilaterally   L5-S1: Disc degeneration with disc bulging. Right paracentral disc protrusion flattening the thecal sac and causing right S1 nerve root impingement. There is also mild left S1 nerve root impingement due to disc bulging and facet hypertrophy. Spinal canal adequate in size.   IMPRESSION: 1. Postop left laminotomy L3-4. Shallow left paracentral disc protrusion remains 2. Mild subarticular and foraminal stenosis at L4-5 with mild spinal stenosis 3. Disc bulging at L5-S1 with right paracentral disc protrusion. There is bilateral S1 nerve root impingement, right greater than left.     Electronically Signed   By: CFranchot GalloM.D.   On: 08/23/2021 08:58     Objective:  VS:  HT:    WT:    BMI:     BP:122/72  HR:66bpm  TEMP: ( )  RESP:  Physical Exam Vitals and nursing note reviewed.  Constitutional:      General: She is not in acute distress.    Appearance: Normal appearance. She is not ill-appearing.  HENT:     Head: Normocephalic and atraumatic.     Right Ear: External ear normal.     Left Ear: External ear normal.  Eyes:     Extraocular Movements: Extraocular movements intact.  Cardiovascular:     Rate and Rhythm: Normal rate.     Pulses: Normal pulses.  Pulmonary:     Effort: Pulmonary effort is normal. No respiratory distress.  Abdominal:     General: There is no distension.     Palpations: Abdomen is soft.  Musculoskeletal:        General: Tenderness present.     Cervical back: Neck supple.     Right lower leg: No edema.     Left lower leg: No edema.     Comments: Patient has good distal strength with no pain over the greater trochanters.  No clonus or focal weakness.  Skin:    Findings: No erythema, lesion or rash.  Neurological:     General: No focal deficit present.     Mental Status: She is  alert and oriented to person, place, and time.     Sensory: No sensory deficit.     Motor: No weakness or abnormal muscle tone.     Coordination: Coordination normal.  Psychiatric:        Mood and Affect: Mood normal.        Behavior: Behavior normal.      Imaging: No results found.

## 2022-04-09 NOTE — Procedures (Signed)
Lumbosacral Transforaminal Epidural Steroid Injection - Sub-Pedicular Approach with Fluoroscopic Guidance  Patient: Lauren [name]      Date of Birth: Sep 20, 1966 MRN: [id number] PCP: System, Provider Not In      Visit Date: 03/25/2022   Universal Protocol:    Date/Time: 03/25/2022  Consent Given By: the patient  Position: PRONE  Additional Comments: Vital signs were monitored before and after the procedure. Patient was prepped and draped in the usual sterile fashion. The correct patient, procedure, and site was verified.   Injection Procedure Details:   Procedure diagnoses: Lumbar radiculopathy [M54.16]    Meds Administered:  Meds ordered this encounter  Medications   methylPREDNISolone acetate (DEPO-MEDROL) injection 80 mg    Laterality: Left  Location/Site: L4  Needle:5.0 in., 22 ga.  Short bevel or Quincke spinal needle  Needle Placement: Transforaminal  Findings:    -Comments: Excellent flow of contrast along the nerve, nerve root and into the epidural space.  Procedure Details: After squaring off the end-plates to get a true AP view, the C-arm was positioned so that an oblique view of the foramen as noted above was visualized. The target area is just inferior to the "nose of the scotty dog" or sub pedicular. The soft tissues overlying this structure were infiltrated with 2-3 ml. of 1% Lidocaine without Epinephrine.  The spinal needle was inserted toward the target using a "trajectory" view along the fluoroscope beam.  Under AP and lateral visualization, the needle was advanced so it did not puncture dura and was located close the 6 O'Clock position of the pedical in AP tracterory. Biplanar projections were used to confirm position. Aspiration was confirmed to be negative for CSF and/or blood. A 1-2 ml. volume of Isovue-250 was injected and flow of contrast was noted at each level. Radiographs were obtained for documentation purposes.   After attaining the desired  flow of contrast documented above, a 0.5 to 1.0 ml test dose of 0.25% Marcaine was injected into each respective transforaminal space.  The patient was observed for 90 seconds post injection.  After no sensory deficits were reported, and normal lower extremity motor function was noted,   the above injectate was administered so that equal amounts of the injectate were placed at each foramen (level) into the transforaminal epidural space.   Additional Comments:  The patient tolerated the procedure well Dressing: 2 x 2 sterile gauze and Band-Aid    Post-procedure details: Patient was observed during the procedure. Post-procedure instructions were reviewed.  Patient left the clinic in stable condition.

## 2022-04-25 ENCOUNTER — Telehealth (HOSPITAL_COMMUNITY): Payer: Self-pay | Admitting: *Deleted

## 2022-04-25 NOTE — Telephone Encounter (Signed)
Attempted to call patient regarding upcoming cardiac CT appointment. Voicemail box not set up to leave a message.  Chan Sheahan RN Navigator Cardiac Imaging [facility] [phone] Office [phone] Cell  

## 2022-04-25 NOTE — Telephone Encounter (Signed)
Patient returning call regarding upcoming cardiac imaging study; pt verbalizes understanding of appt date/time, parking situation and where to check in, pre-test NPO status and medications ordered, and verified current allergies; name and call back number provided for further questions should they arise  Gordy Clement RN Navigator Cardiac Imaging [facility] [phone] office [phone] cell  Patient to take her daily metoprolol tartrate two hours prior to cardiac CT scan.  She is aware to arrive at 10:30am.  Confirmed that patient only had nausea when she had contrast.

## 2022-04-26 ENCOUNTER — Telehealth: Payer: Self-pay | Admitting: Gastroenterology

## 2022-04-26 ENCOUNTER — Ambulatory Visit (HOSPITAL_COMMUNITY)
Admission: RE | Admit: 2022-04-26 | Discharge: 2022-04-26 | Disposition: A | Discharge status: Home / Self Care | Payer: Managed Care, Other (non HMO) | Source: Ambulatory Visit | Attending: Cardiovascular Disease | Admitting: Cardiovascular Disease

## 2022-04-26 DIAGNOSIS — R072 Precordial pain: Secondary | ICD-10-CM | POA: Insufficient documentation

## 2022-04-26 MED ORDER — IOHEXOL 350 MG/ML SOLN
100.0000 mL | Freq: Once | INTRAVENOUS | Status: AC | PRN
  Administered 2022-04-26: 100 mL via INTRAVENOUS

## 2022-04-26 MED ORDER — NITROGLYCERIN 0.4 MG SL SUBL
SUBLINGUAL_TABLET | SUBLINGUAL | Status: AC
  Filled 2022-04-26: qty 1

## 2022-04-26 MED ORDER — NITROGLYCERIN 0.4 MG SL SUBL
0.8000 mg | SUBLINGUAL_TABLET | Freq: Once | SUBLINGUAL | Status: AC
  Administered 2022-04-26: 0.8 mg via SUBLINGUAL

## 2022-05-03 ENCOUNTER — Telehealth: Payer: Self-pay

## 2022-05-03 ENCOUNTER — Telehealth: Payer: Self-pay | Admitting: *Deleted

## 2022-05-03 NOTE — Telephone Encounter (Signed)
Pt agreeable to plan of care for tele visit after she has been seen by Dr. Quentin Ore as a consult with EP dx SVT. Pt tele appt 05/31/22. Med rec and consent are done.

## 2022-05-03 NOTE — Telephone Encounter (Signed)
[facility] Pre-operative Risk Assessment     Request for surgical clearance:     Endoscopy Procedure  What type of surgery is being performed?     EGD  When is this surgery scheduled?     06/10/22   What type of clearance is required ?   Cardiac. Pt's previous EGD on 04/09/22 was cancelled d/t further cardiac testing. Patient states she completed testing & would like to reschedule.  Are there any medications that need to be held prior to surgery and how long? N/A  Practice name and name of physician performing surgery?      [facility]  What is your office phone and fax number?      Phone- [phone]  [phone]  Anesthesia type (None, local, MAC, general) ?       MAC

## 2022-05-03 NOTE — Telephone Encounter (Signed)
Pt agreeable to plan of care for tele visit after she has been seen by Dr. Quentin Ore as a consult with EP dx SVT. Pt tele appt 05/31/22. Med rec and consent are done.     Patient Consent for Virtual Visit        Lauren [name] has provided verbal consent on 05/03/2022 for a virtual visit (video or telephone).   CONSENT FOR VIRTUAL VISIT FOR:  Lauren [name]  By participating in this virtual visit I agree to the following:  I hereby voluntarily request, consent and authorize [facility] and its employed or contracted physicians, physician assistants, nurse practitioners or other licensed health care professionals (the Practitioner), to provide me with telemedicine health care services (the "Services") as deemed necessary by the treating Practitioner. I acknowledge and consent to receive the Services by the Practitioner via telemedicine. I understand that the telemedicine visit will involve communicating with the Practitioner through live audiovisual communication technology and the disclosure of certain medical information by electronic transmission. I acknowledge that I have been given the opportunity to request an in-person assessment or other available alternative prior to the telemedicine visit and am voluntarily participating in the telemedicine visit.  I understand that I have the right to withhold or withdraw my consent to the use of telemedicine in the course of my care at any time, without affecting my right to future care or treatment, and that the Practitioner or I may terminate the telemedicine visit at any time. I understand that I have the right to inspect all information obtained and/or recorded in the course of the telemedicine visit and may receive copies of available information for a reasonable fee.  I understand that some of the potential risks of receiving the Services via telemedicine include:  Delay or interruption in medical evaluation due to technological equipment failure  or disruption; Information transmitted may not be sufficient (e.g. poor resolution of images) to allow for appropriate medical decision making by the Practitioner; and/or  In rare instances, security protocols could fail, causing a breach of personal health information.  Furthermore, I acknowledge that it is my responsibility to provide information about my medical history, conditions and care that is complete and accurate to the best of my ability. I acknowledge that Practitioner's advice, recommendations, and/or decision may be based on factors not within their control, such as incomplete or inaccurate data provided by me or distortions of diagnostic images or specimens that may result from electronic transmissions. I understand that the practice of medicine is not an exact science and that Practitioner makes no warranties or guarantees regarding treatment outcomes. I acknowledge that a copy of this consent can be made available to me via my patient portal ([place]), or I can request a printed copy by calling the office of [facility].    I understand that my insurance will be billed for this visit.   I have read or had this consent read to me. I understand the contents of this consent, which adequately explains the benefits and risks of the Services being provided via telemedicine.  I have been provided ample opportunity to ask questions regarding this consent and the Services and have had my questions answered to my satisfaction. I give my informed consent for the services to be provided through the use of telemedicine in my medical care

## 2022-05-03 NOTE — Telephone Encounter (Signed)
Lauren [name], PAC: I would say best to arrange virtual visit just for clearance after EP visit.

## 2022-05-03 NOTE — Telephone Encounter (Signed)
Inbound call from patient calling back to schedule EDG. Advised patient that a nurse will call her back with further information. Patient verbalized understanding.  Thank you

## 2022-05-03 NOTE — Telephone Encounter (Signed)
I d/w the pre op provider who agrees we should wait until the pt has been seen by EP.   Nicholes Rough, PAC: yes, I think that makes sense. We probably couln't clear her without EP input

## 2022-05-03 NOTE — Telephone Encounter (Signed)
  Pt is returning call to schedule tele visit

## 2022-05-03 NOTE — Telephone Encounter (Signed)
Left message for the pt to call back to set up tele visit after she has been seen for her consult with EP Dr. Quentin Ore for SVT. I will update the requesting office as well.

## 2022-05-03 NOTE — Telephone Encounter (Signed)
Spoke with patient in regards to rescheduling EGD. She states she completed her cardiac testing on 04/26/22. Patient has been rescheduled for EGD (tentatively) for 06/10/22 at 1:30 pm in the [facility] [place] with Dr. Tarri Glenn. Cardiac clearance has been sent to pre-op.

## 2022-05-03 NOTE — Telephone Encounter (Signed)
I left message for the pt to call the office for tele visit. I do see the pt has been referred to EP as well for SVT. I have sent a message to the pre op provider if we need to wait until the pt has been seen by  Quentin Ore on 05/27/22.Marland Kitchen

## 2022-05-08 ENCOUNTER — Telehealth: Payer: Self-pay | Admitting: Gastroenterology

## 2022-05-08 NOTE — Telephone Encounter (Signed)
Patient called in with complaints of a cough when she goes to bed. She was last seen on 04/02/22 with Dr. Tarri Glenn for a cough & was advised to increase her omeprazole to 40 mg BID which she has been following. She has even tried laying with her pillows elevated & avoids late night meals. She is noticing more phlegm when she coughs. Pt seeking further advice. She is currently scheduled for EGD on 06/10/22.

## 2022-05-09 ENCOUNTER — Other Ambulatory Visit: Payer: Self-pay

## 2022-05-09 NOTE — Telephone Encounter (Signed)
Left message for patient to call back  

## 2022-05-09 NOTE — Telephone Encounter (Signed)
Please advise patient to continue antireflux measures and omeprazole 40 mg twice daily.  Add Pepcid 20 mg at bedtime for nocturnal reflux.  Advised patient to contact PMD or her pulmonary doctor for coughing up phlegm to exclude any other pulmonary etiology for exacerbation of cough and phlegm other than GERD.  Thank you

## 2022-05-10 ENCOUNTER — Other Ambulatory Visit: Payer: Self-pay

## 2022-05-10 ENCOUNTER — Telehealth: Payer: Self-pay | Admitting: Gastroenterology

## 2022-05-10 MED ORDER — FAMOTIDINE 20 MG PO TABS: 20.0000 mg | ORAL_TABLET | Freq: Every day | ORAL | 0 refills | Status: DC

## 2022-05-10 NOTE — Telephone Encounter (Signed)
Patient returned your phone call.  Please call back.  Thank you. 

## 2022-05-10 NOTE — Telephone Encounter (Signed)
Spoke with patient regarding MD recommendations. Prescription sent to pharmacy.  

## 2022-05-10 NOTE — Telephone Encounter (Signed)
Refer to alternate phone note.

## 2022-05-22 ENCOUNTER — Ambulatory Visit (HOSPITAL_COMMUNITY)
Admission: EM | Admit: 2022-05-22 | Discharge: 2022-05-22 | Disposition: A | Discharge status: Home / Self Care | Payer: Managed Care, Other (non HMO)

## 2022-05-22 ENCOUNTER — Encounter (HOSPITAL_COMMUNITY): Payer: Self-pay | Admitting: *Deleted

## 2022-05-22 DIAGNOSIS — J069 Acute upper respiratory infection, unspecified: Secondary | ICD-10-CM | POA: Diagnosis not present

## 2022-05-22 DIAGNOSIS — J4521 Mild intermittent asthma with (acute) exacerbation: Secondary | ICD-10-CM

## 2022-05-22 MED ORDER — PREDNISONE 10 MG (21) PO TBPK: ORAL_TABLET | ORAL | 0 refills | Status: DC

## 2022-05-22 MED ORDER — BENZONATATE 100 MG PO CAPS: 100.0000 mg | ORAL_CAPSULE | Freq: Three times a day (TID) | ORAL | 0 refills | Status: DC

## 2022-05-22 NOTE — ED Provider Notes (Signed)
[place]    CSN: [id number] Arrival date & time: 05/22/22  1603      History   Chief Complaint Chief Complaint  Patient presents with   Cough    HPI Lauren [name] is a 56 y.o. female.   Patient presents with concerns of productive cough and throat irritation for the past few days. She denies headache, body aches, ear pain, or much nasal congestion. She reports her throat isn't really sore, just feels irritated and tight. She states her main concern is the cough. She does have history of asthma. She denies difficulty breathing but has needed to use her inhaler more than usual. The patient has tried OTC medicines with some improvement.   The history is provided by the patient.  Cough Associated symptoms: sore throat and wheezing   Associated symptoms: no chest pain, no ear pain, no fever, no headaches, no myalgias, no rash and no shortness of breath     Past Medical History:  Diagnosis Date   Anxiety    Arthritis    Depression    GERD (gastroesophageal reflux disease)    Hypertension    Prediabetes    Sleep apnea    Past hx - no cpap now    SVT (supraventricular tachycardia) ([place])     Patient Active Problem List   Diagnosis Date Noted   Recurrent major depressive disorder, remission status unspecified ([place]) 12/17/2021   Left lumbar radiculitis 04/19/2021   Radiculopathy of cervical spine 08/25/2018   Pain in right hand 07/28/2018   Trigger finger, right ring finger 07/28/2018   Mixed hyperlipidemia 06/19/2017   Gastroesophageal reflux disease 04/15/2017   OSA (obstructive sleep apnea) 04/15/2017   Depression 11/28/2016    Past Surgical History:  Procedure Laterality Date   ABDOMINAL HYSTERECTOMY     BACK SURGERY     CESAREAN SECTION     COLONOSCOPY     HAND SURGERY     NECK SURGERY  2002   plate in her neck    OB History     Gravida  1   Para      Term      Preterm      AB      Living  1      SAB      IAB      Ectopic       Multiple      Live Births  1            Home Medications    Prior to Admission medications   Medication Sig Start Date End Date Taking? Authorizing Provider  albuterol (PROAIR HFA) 108 (90 Base) MCG/ACT inhaler Inhale 1-2 puffs into the lungs every 6 (six) hours as needed for wheezing or shortness of breath. 12/17/21  Yes Charlott Rakes, MD  aspirin EC 81 MG tablet Take 81 mg by mouth daily. Swallow whole.   Yes [provider]  atorvastatin (LIPITOR) 10 MG tablet TAKE 1 TABLET (10 MG TOTAL) BY MOUTH DAILY. 02/01/22 02/01/23 Yes Charlott Rakes, MD  benzonatate (TESSALON) 100 MG capsule Take 1 capsule (100 mg total) by mouth every 8 (eight) hours. 05/22/22  Yes Patrik Turnbaugh L, PA  famotidine (PEPCID) 20 MG tablet Take 1 tablet (20 mg total) by mouth at bedtime. 05/10/22 06/09/22 Yes Nandigam, Venia Minks, MD  fluticasone (FLONASE) 50 MCG/ACT nasal spray Place 2 sprays into both nostrils daily. 03/04/22  Yes [provider]  lisinopril (ZESTRIL) 20 MG tablet TAKE  1 TABLET (20 MG TOTAL) BY MOUTH DAILY. 02/01/22 02/01/23 Yes Charlott Rakes, MD  metoprolol tartrate (LOPRESSOR) 25 MG tablet Take 1 tablet (25 mg total) by mouth 2 (two) times daily. 03/21/22 05/22/22 Yes Tacy Learn, PA-C  Misc. Devices MISC Please provide patient with insurance approved supplies for her CPAP: tubing, chin strap and supplies and filters. 11/17/18  Yes Gildardo Pounds, NP  Multiple Vitamin (MULTIVITAMIN) capsule Take by mouth.   Yes [provider]  omeprazole (PRILOSEC) 40 MG capsule Take 1 capsule (40 mg total) by mouth 2 (two) times daily before a meal. 04/02/22  Yes Thornton Park, MD  predniSONE (STERAPRED UNI-PAK 21 TAB) 10 MG (21) TBPK tablet Take as directed 05/22/22  Yes [place] Silveria L, PA  traZODone (DESYREL) 100 MG tablet Take 0.5-1 tablets (50-100 mg total) by mouth at bedtime. 01/08/22  Yes Gildardo Pounds, NP  venlafaxine XR (EFFEXOR-XR) 150 MG 24 hr capsule Take 1 capsule (150  mg total) by mouth every morning. 02/01/22  Yes Newlin, Charlane Ferretti, MD  busPIRone (BUSPAR) 15 MG tablet Take 15 mg by mouth 2 (two) times daily. 05/08/22   [provider]  ibuprofen (ADVIL) 600 MG tablet Take 1 tablet (600 mg total) by mouth every 8 (eight) hours as needed. Patient not taking: Reported on 04/04/2022 02/08/21   Gildardo Pounds, NP  lamoTRIgine (LAMICTAL) 25 MG tablet Take 25 mg by mouth daily. 05/18/22   [provider]    Family History Family History  Problem Relation Age of Onset   Healthy Mother    Heart attack Father    Diabetes Father    Heart attack Sister    Colon cancer Neg Hx    Colon polyps Neg Hx    Esophageal cancer Neg Hx    Rectal cancer Neg Hx    Stomach cancer Neg Hx     Social History Social History   Tobacco Use   Smoking status: Every Day    Packs/day: 0.50    Years: 20.00    Total pack years: 10.00    Types: Cigarettes    Passive exposure: Current   Smokeless tobacco: Never   Tobacco comments:    0.5 or less   Vaping Use   Vaping Use: Never used  Substance Use Topics   Alcohol use: No   Drug use: No     Allergies   Iodinated contrast media   Review of Systems Review of Systems  Constitutional:  Negative for fatigue and fever.  HENT:  Positive for sore throat. Negative for congestion, ear pain and sinus pressure.   Respiratory:  Positive for cough and wheezing. Negative for chest tightness and shortness of breath.   Cardiovascular:  Negative for chest pain.  Gastrointestinal:  Negative for nausea and vomiting.  Musculoskeletal:  Negative for myalgias.  Skin:  Negative for rash.  Neurological:  Negative for headaches.     Physical Exam Triage Vital Signs ED Triage Vitals  Enc Vitals Group     BP 05/22/22 1700 (!) 144/94     Pulse Rate 05/22/22 1700 75     Resp 05/22/22 1700 20     Temp 05/22/22 1700 98.6 F (37 C)     Temp Source 05/22/22 1700 Oral     SpO2 05/22/22 1700 98 %     Weight --       Height --      Head Circumference --      Peak Flow --  Pain Score 05/22/22 1657 0     Pain Loc --      Pain Edu? --      Excl. in [place]? --    No data found.  Updated Vital Signs BP (!) 144/94 (BP Location: Left Arm)   Pulse 75   Temp 98.6 F (37 C) (Oral)   Resp 20   SpO2 98%   Visual Acuity Right Eye Distance:   Left Eye Distance:   Bilateral Distance:    Right Eye Near:   Left Eye Near:    Bilateral Near:     Physical Exam Vitals and nursing note reviewed.  Constitutional:      General: She is not in acute distress. HENT:     Head: Normocephalic.     Nose: Nose normal. No congestion.     Mouth/Throat:     Mouth: Mucous membranes are moist.     Pharynx: Oropharynx is clear. No oropharyngeal exudate or posterior oropharyngeal erythema.  Eyes:     Conjunctiva/sclera: Conjunctivae normal.     Pupils: Pupils are equal, round, and reactive to light.  Cardiovascular:     Rate and Rhythm: Normal rate and regular rhythm.     Heart sounds: Normal heart sounds.  Pulmonary:     Effort: Pulmonary effort is normal. No respiratory distress.     Breath sounds: Normal breath sounds. No stridor. No wheezing, rhonchi or rales.  Musculoskeletal:     Cervical back: Normal range of motion.  Lymphadenopathy:     Cervical: No cervical adenopathy.  Skin:    Findings: No rash.  Neurological:     Mental Status: She is alert.     Gait: Gait normal.  Psychiatric:        Mood and Affect: Mood normal.      UC Treatments / Results  Labs (all labs ordered are listed, but only abnormal results are displayed) Labs Reviewed - No data to display  EKG   Radiology No results found.  Procedures Procedures (including critical care time)  Medications Ordered in UC Medications - No data to display  Initial Impression / Assessment and Plan / UC Course  I have reviewed the triage vital signs and the nursing notes.  Pertinent labs & imaging results that were available during  my care of the patient were reviewed by me and considered in my medical decision making (see chart for details).     Viral URI w asthma exacerbation. Sx tx and steroid taper for asthma. Return and ER precautions discussed.   E/M: 1 acute uncomplicated illness & 1 chronic illness with exacerbation, no data, moderate risk due to prescription management  Final Clinical Impressions(s) / UC Diagnoses   Final diagnoses:  Viral URI with cough  Mild intermittent asthma with exacerbation     Discharge Instructions      Take Tessalon as prescribed to help with cough. You can also continue with OTC medicine. Take prednisone as prescribed and continue albuterol as needed to help with asthma. Rest and keep hydrated. Follow-up with PCP if no improvement in a week. Go to the ER if develop difficulty breathing not improved with your inhaler.     ED Prescriptions     Medication Sig Dispense Auth. Provider   benzonatate (TESSALON) 100 MG capsule Take 1 capsule (100 mg total) by mouth every 8 (eight) hours. 21 capsule Abner Greenspan, Mitchell Iwanicki L, PA   predniSONE (STERAPRED UNI-PAK 21 TAB) 10 MG (21) TBPK tablet Take as directed 1 each Abner Greenspan,  Hafiz Irion L, PA      PDMP not reviewed this encounter.   Delsa Sale, [place] 05/22/22 1725

## 2022-05-22 NOTE — Discharge Instructions (Signed)
Take Tessalon as prescribed to help with cough. You can also continue with OTC medicine. Take prednisone as prescribed and continue albuterol as needed to help with asthma. Rest and keep hydrated. Follow-up with PCP if no improvement in a week. Go to the ER if develop difficulty breathing not improved with your inhaler.

## 2022-05-22 NOTE — ED Triage Notes (Signed)
Patient has had cough, congestion X 4 days. No fever. Took allergy meds OTC, cough and mucus meds OTC.

## 2022-05-24 NOTE — Progress Notes (Deleted)
Electrophysiology Office Note:    Date:  05/24/2022   ID:  Lauren [name], DOB [birth year], MRN [id number]  PCP:  System, Provider Not In  [place] Cardiologist:  None  [facility] Electrophysiologist:  None   Referring MD: Geralynn Rile, *   Chief Complaint: SVT  History of Present Illness:    Lauren [name] is a 56 y.o. female who presents for an evaluation of SVT at the request of Dr Audie Box. Their medical history includes HTN, SVT ,GERD. She last saw Dr Audie Box 04/04/2022. She was previously seen in the ER on 03/21/2022 and required IV adenosine to convert to normal rhythm. She was presyncopal during the episode and felt palpitations.      Past Medical History:  Diagnosis Date   Anxiety    Arthritis    Depression    GERD (gastroesophageal reflux disease)    Hypertension    Prediabetes    Sleep apnea    Past hx - no cpap now    SVT (supraventricular tachycardia) ([place])     Past Surgical History:  Procedure Laterality Date   ABDOMINAL HYSTERECTOMY     BACK SURGERY     CESAREAN SECTION     COLONOSCOPY     HAND SURGERY     NECK SURGERY  2002   plate in her neck    Current Medications: No outpatient medications have been marked as taking for the 05/27/22 encounter (Appointment) with Vickie Epley, MD.     Allergies:   Iodinated contrast media   Social History   Socioeconomic History   Marital status: Divorced    Spouse name: Not on file   Number of children: 1   Years of education: Not on file   Highest education level: Not on file  Occupational History   Occupation: Barrister's clerk - Furniture conservator/restorer  Tobacco Use   Smoking status: Every Day    Packs/day: 0.50    Years: 20.00    Total pack years: 10.00    Types: Cigarettes    Passive exposure: Current   Smokeless tobacco: Never   Tobacco comments:    0.5 or less   Vaping Use   Vaping Use: Never used  Substance and Sexual Activity   Alcohol use: No   Drug use: No   Sexual activity:  Not Currently    Birth control/protection: None  Other Topics Concern   Not on file  Social History Narrative   Not on file   Social Determinants of Health   Financial Resource Strain: Not on file  Food Insecurity: Not on file  Transportation Needs: Not on file  Physical Activity: Not on file  Stress: Not on file  Social Connections: Not on file     Family History: The patient's family history includes Diabetes in her father; Healthy in her mother; Heart attack in her father and sister. There is no history of Colon cancer, Colon polyps, Esophageal cancer, Rectal cancer, or Stomach cancer.  ROS:   Please see the history of present illness.    All other systems reviewed and are negative.  EKGs/Labs/Other Studies Reviewed:    The following studies were reviewed today:  04/26/2022 CTA Coronary IMPRESSION: 1. Coronary calcium score of 1.1. This was 78th percentile for age-, sex, and race-matched controls. Plaque volume 18 mm3.   2. Normal coronary origin with right dominance.   3. Minimal calcified plaque (<25%) in the mid LAD.   4. Minimal non-calcified plaque (<25%) in the LCX.  04/08/2022 echo EF 65 RV normal Trivial MR  03/25/2022 EMS run sheet    EKG:  The ekg ordered today demonstrates ***   Recent Labs: 03/21/2022: Magnesium 1.9 03/31/2022: BUN 22; Creatinine, Ser 0.65; Hemoglobin 12.5; Platelets 391; Potassium 3.6; Sodium 137 04/04/2022: TSH 2.720  Recent Lipid Panel    Component Value Date/Time   CHOL 177 10/31/2020 1455   TRIG 76 10/31/2020 1455   HDL 57 10/31/2020 1455   CHOLHDL 3.1 10/31/2020 1455   LDLCALC 106 (H) 10/31/2020 1455    Physical Exam:    VS:  There were no vitals taken for this visit.    Wt Readings from Last 3 Encounters:  04/04/22 169 lb 12.8 oz (77 kg)  04/02/22 168 lb (76.2 kg)  03/31/22 170 lb (77.1 kg)     GEN: *** Well nourished, well developed in no acute distress HEENT: Normal NECK: No JVD; No carotid  bruits LYMPHATICS: No lymphadenopathy CARDIAC: ***RRR, no murmurs, rubs, gallops RESPIRATORY:  Clear to auscultation without rales, wheezing or rhonchi  ABDOMEN: Soft, non-tender, non-distended MUSCULOSKELETAL:  No edema; No deformity  SKIN: Warm and dry NEUROLOGIC:  Alert and oriented x 3 PSYCHIATRIC:  Normal affect       ASSESSMENT:    No diagnosis found. PLAN:    In order of problems listed above:   #SVT Recurrent and symptomatic. ECG appearance most consistent with AVNRT although I cannot completely exclude other mechanisms for SVT.  Therapeutic strategies for supraventricular tachycardia including medicine and ablation were discussed in detail with the patient today. Risk, benefits, and alternatives to EP study and radiofrequency ablation were also discussed in detail today. These risks include but are not limited to stroke, bleeding, vascular damage, tamponade, perforation, damage to the heart and other structures, AV block requiring pacemaker, worsening renal function, and death. The patient understands these risk and wishes to proceed.  We will therefore proceed with catheter ablation at the next available time.  Hold beta blockers for 5 days prior to procedure.  #HTN Controlled.    Total time spent with patient today *** minutes. This includes reviewing records, evaluating the patient and coordinating care.  Medication Adjustments/Labs and Tests Ordered: Current medicines are reviewed at length with the patient today.  Concerns regarding medicines are outlined above.  No orders of the defined types were placed in this encounter.  No orders of the defined types were placed in this encounter.    Signed, Hilton Cork. Quentin Ore, MD, [facility], [facility] 05/24/2022 5:55 PM    Electrophysiology [facility]

## 2022-05-27 ENCOUNTER — Ambulatory Visit (INDEPENDENT_AMBULATORY_CARE_PROVIDER_SITE_OTHER): Payer: Managed Care, Other (non HMO) | Admitting: Cardiology

## 2022-05-27 ENCOUNTER — Encounter: Payer: Self-pay | Admitting: Cardiology

## 2022-05-27 ENCOUNTER — Encounter: Payer: Self-pay | Admitting: *Deleted

## 2022-05-27 VITALS — BP 116/74 | HR 50 | Ht 62.0 in | Wt 170.2 lb

## 2022-05-27 DIAGNOSIS — I10 Essential (primary) hypertension: Secondary | ICD-10-CM

## 2022-05-27 DIAGNOSIS — Z01818 Encounter for other preprocedural examination: Secondary | ICD-10-CM

## 2022-05-27 DIAGNOSIS — Z720 Tobacco use: Secondary | ICD-10-CM

## 2022-05-27 DIAGNOSIS — I471 Supraventricular tachycardia: Secondary | ICD-10-CM

## 2022-05-27 NOTE — Progress Notes (Signed)
Electrophysiology Office Note:    Date:  05/27/2022   ID:  Lauren [name], DOB [birth year], MRN [id number]  PCP:  System, Provider Not In  [facility] Cardiologist:  None  [facility] Electrophysiologist:  Vickie Epley, MD   Referring MD: Geralynn Rile, *   Chief Complaint: SVT  History of Present Illness:    Lauren [name] is a 56 y.o. female who presents for an evaluation of SVT at the request of Dr Audie Box. Their medical history includes HTN, SVT ,GERD. She last saw Dr Audie Box 04/04/2022. She was previously seen in the ER on 03/21/2022 and required IV adenosine to convert to normal rhythm. She was presyncopal during the episode and felt palpitations.   Today, she reports that her initial episode of SVT was last year on her birthday. There were no recurring episodes until recently which prompted her ER visit. On 03/21/22 she was feeling lightheaded after waking up, and "just didn't feel right." She was also having difficulty breathing. She confirms feeling better after receiving IV adenosine.   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  At 56 yo she was started on a CPAP. Currently she is not using this as she is unable to afford the CPAP at this time.  In her family, her younger sister had a heart attack. Her father had a CABG x3. Her mother also had heart issues.  Every time she lies down, she subsequently develops coughing spells. She is following with GI and imaging is planned for the 15th.  They deny any chest pain, shortness of breath, or peripheral edema. No lightheadedness, headaches, syncope, orthopnea, or PND.     Past Medical History:  Diagnosis Date   Anxiety    Arthritis    Depression    GERD (gastroesophageal reflux disease)    Hypertension    Prediabetes    Sleep apnea    Past hx - no cpap now    SVT (supraventricular tachycardia) ([place])     Past Surgical History:  Procedure  Laterality Date   ABDOMINAL HYSTERECTOMY     BACK SURGERY     CESAREAN SECTION     COLONOSCOPY     HAND SURGERY     NECK SURGERY  2002   plate in her neck    Current Medications: Current Meds  Medication Sig   albuterol (PROAIR HFA) 108 (90 Base) MCG/ACT inhaler Inhale 1-2 puffs into the lungs every 6 (six) hours as needed for wheezing or shortness of breath.   aspirin EC 81 MG tablet Take 81 mg by mouth daily. Swallow whole.   atorvastatin (LIPITOR) 10 MG tablet TAKE 1 TABLET (10 MG TOTAL) BY MOUTH DAILY.   benzonatate (TESSALON) 100 MG capsule Take 1 capsule (100 mg total) by mouth every 8 (eight) hours.   busPIRone (BUSPAR) 15 MG tablet Take 15 mg by mouth 2 (two) times daily.   famotidine (PEPCID) 20 MG tablet Take 1 tablet (20 mg total) by mouth at bedtime.   fluticasone (FLONASE) 50 MCG/ACT nasal spray Place 2 sprays into both nostrils daily.   ibuprofen (ADVIL) 600 MG tablet Take 1 tablet (600 mg total) by mouth every 8 (eight) hours as needed.   lamoTRIgine (LAMICTAL) 25 MG tablet Take 25 mg by mouth daily.   lisinopril (ZESTRIL) 20 MG tablet TAKE 1 TABLET (20 MG TOTAL) BY MOUTH DAILY.   Misc. Devices MISC Please provide patient with insurance approved supplies  for her CPAP: tubing, chin strap and supplies and filters.   Multiple Vitamin (MULTIVITAMIN) capsule Take by mouth.   omeprazole (PRILOSEC) 40 MG capsule Take 1 capsule (40 mg total) by mouth 2 (two) times daily before a meal.   predniSONE (STERAPRED UNI-PAK 21 TAB) 10 MG (21) TBPK tablet Take as directed   traZODone (DESYREL) 100 MG tablet Take 0.5-1 tablets (50-100 mg total) by mouth at bedtime.   venlafaxine XR (EFFEXOR-XR) 150 MG 24 hr capsule Take 1 capsule (150 mg total) by mouth every morning.     Allergies:   Iodinated contrast media   Social History   Socioeconomic History   Marital status: Divorced    Spouse name: Not on file   Number of children: 1   Years of education: Not on file   Highest education  level: Not on file  Occupational History   Occupation: Barrister's clerk - Furniture conservator/restorer  Tobacco Use   Smoking status: Every Day    Packs/day: 0.50    Years: 20.00    Total pack years: 10.00    Types: Cigarettes    Passive exposure: Current   Smokeless tobacco: Never   Tobacco comments:    0.5 or less   Vaping Use   Vaping Use: Never used  Substance and Sexual Activity   Alcohol use: No   Drug use: No   Sexual activity: Not Currently    Birth control/protection: None  Other Topics Concern   Not on file  Social History Narrative   Not on file   Social Determinants of Health   Financial Resource Strain: Not on file  Food Insecurity: Not on file  Transportation Needs: Not on file  Physical Activity: Not on file  Stress: Not on file  Social Connections: Not on file     Family History: The patient's family history includes Diabetes in her father; Healthy in her mother; Heart attack in her father and sister. There is no history of Colon cancer, Colon polyps, Esophageal cancer, Rectal cancer, or Stomach cancer.  ROS:   Please see the history of present illness.    All other systems reviewed and are negative.  EKGs/Labs/Other Studies Reviewed:    The following studies were reviewed today:  04/26/2022 CTA Coronary IMPRESSION: 1. Coronary calcium score of 1.1. This was 78th percentile for age-, sex, and race-matched controls. Plaque volume 18 mm3.   2. Normal coronary origin with right dominance.   3. Minimal calcified plaque (<25%) in the mid LAD.   4. Minimal non-calcified plaque (<25%) in the LCX.     04/08/2022 echo EF 65 RV normal Trivial MR   03/25/2022 EMS run sheet     EKG:   EKG is personally reviewed.  05/27/2022:  EKG was not ordered.    Recent Labs: 03/21/2022: Magnesium 1.9 03/31/2022: BUN 22; Creatinine, Ser 0.65; Hemoglobin 12.5; Platelets 391; Potassium 3.6; Sodium 137 04/04/2022: TSH 2.720   Recent Lipid Panel    Component Value Date/Time    CHOL 177 10/31/2020 1455   TRIG 76 10/31/2020 1455   HDL 57 10/31/2020 1455   CHOLHDL 3.1 10/31/2020 1455   LDLCALC 106 (H) 10/31/2020 1455    Physical Exam:    VS:  BP 116/74   Pulse (!) 50   Ht '5\' 2"'$  (1.575 m)   Wt 170 lb 3.2 oz (77.2 kg)   SpO2 97%   BMI 31.13 kg/m     Wt Readings from Last 3 Encounters:  05/27/22 170 lb 3.2  oz (77.2 kg)  04/04/22 169 lb 12.8 oz (77 kg)  04/02/22 168 lb (76.2 kg)     GEN: Well nourished, well developed in no acute distress HEENT: Normal NECK: No JVD; No carotid bruits LYMPHATICS: No lymphadenopathy CARDIAC: RRR, no murmurs, rubs, gallops RESPIRATORY:  Clear to auscultation without rales, wheezing or rhonchi  ABDOMEN: Soft, non-tender, non-distended MUSCULOSKELETAL:  No edema; No deformity  SKIN: Warm and dry NEUROLOGIC:  Alert and oriented x 3 PSYCHIATRIC:  Normal affect       ASSESSMENT:    1. SVT (supraventricular tachycardia) ([place])   2. Tobacco abuse   3. Primary hypertension    PLAN:    In order of problems listed above:  #SVT Recurrent and symptomatic. ECG appearance most consistent with AVNRT although I cannot completely exclude other mechanisms for SVT.   Therapeutic strategies for supraventricular tachycardia including medicine and ablation were discussed in detail with the patient today. Risk, benefits, and alternatives to EP study and radiofrequency ablation were also discussed in detail today. These risks include but are not limited to stroke, bleeding, vascular damage, tamponade, perforation, damage to the heart and other structures, AV block requiring pacemaker, worsening renal function, and death. The patient understands these risk and wishes to proceed.  We will therefore proceed with catheter ablation at the next available time.   Hold beta blockers for 5 days prior to procedure.   #HTN Controlled.   Lauren [name] should be okay to proceed with her EGD.  I would recommend continuing the metoprolol around the  time of the EGD procedure which is tentatively scheduled for next week.    Medication Adjustments/Labs and Tests Ordered: Current medicines are reviewed at length with the patient today.  Concerns regarding medicines are outlined above.  No orders of the defined types were placed in this encounter.  No orders of the defined types were placed in this encounter.   I,Mathew Stumpf,acting as a Education administrator for Vickie Epley, MD.,have documented all relevant documentation on the behalf of Vickie Epley, MD,as directed by  Vickie Epley, MD while in the presence of Vickie Epley, MD.  I, Vickie Epley, MD, have reviewed all documentation for this visit. The documentation on 05/27/22 for the exam, diagnosis, procedures, and orders are all accurate and complete.   Signed, Hilton Cork. Quentin Ore, MD, [facility], [facility] 05/27/2022 10:33 AM    Electrophysiology [facility]

## 2022-05-27 NOTE — Patient Instructions (Signed)
Medication Instructions:  none *If you need a refill on your cardiac medications before your next appointment, please call your pharmacy*   Lab Work: Aug 14  If you have labs (blood work) drawn today and your tests are completely normal, you will receive your results only by: [place] (if you have MyChart) OR A paper copy in the mail If you have any lab test that is abnormal or we need to change your treatment, we will call you to review the results.   Testing/Procedures: Your physician has recommended that you have an ablation. Catheter ablation is a medical procedure used to treat some cardiac arrhythmias (irregular heartbeats). During catheter ablation, a long, thin, flexible tube is put into a blood vessel in your groin (upper thigh), or neck. This tube is called an ablation catheter. It is then guided to your heart through the blood vessel. Radio frequency waves destroy small areas of heart tissue where abnormal heartbeats may cause an arrhythmia to start. Please see the instruction sheet given to you today.  Follow-Up: At [facility], you and your health needs are our priority.  As part of our continuing mission to provide you with exceptional heart care, we have created designated Provider Care Teams.  These Care Teams include your primary Cardiologist (physician) and Advanced Practice Providers (APPs -  Physician Assistants and Nurse Practitioners) who all work together to provide you with the care you need, when you need it.  We recommend signing up for the patient portal called "MyChart".  Sign up information is provided on this After Visit Summary.  MyChart is used to connect with patients for Virtual Visits (Telemedicine).  Patients are able to view lab/test results, encounter notes, upcoming appointments, etc.  Non-urgent messages can be sent to your provider as well.   To learn more about what you can do with MyChart, go to [email or web address].    Your next  appointment:   See instructions letter.}    Other Instructions Cardiac Ablation Cardiac ablation is a procedure to destroy (ablate) some heart tissue that is sending bad signals. These bad signals cause problems in heart rhythm. The heart has many areas that make these signals. If there are problems in these areas, they can make the heart beat in a way that is not normal. Destroying some tissues can help make the heart rhythm normal. Tell your doctor about: Any allergies you have. All medicines you are taking. These include vitamins, herbs, eye drops, creams, and over-the-counter medicines. Any problems you or family members have had with medicines that make you fall asleep (anesthetics). Any blood disorders you have. Any surgeries you have had. Any medical conditions you have, such as kidney failure. Whether you are pregnant or may be pregnant. What are the risks? This is a safe procedure. But problems may occur, including: Infection. Bruising and bleeding. Bleeding into the chest. Stroke or blood clots. Damage to nearby areas of your body. Allergies to medicines or dyes. The need for a pacemaker if the normal system is damaged. Failure of the procedure to treat the problem. What happens before the procedure? Medicines Ask your doctor about: Changing or stopping your normal medicines. This is important. Taking aspirin and ibuprofen. Do not take these medicines unless your doctor tells you to take them. Taking other medicines, vitamins, herbs, and supplements. General instructions Follow instructions from your doctor about what you cannot eat or drink. Plan to have someone take you home from the hospital or clinic. If you will be  going home right after the procedure, plan to have someone with you for 24 hours. Ask your doctor what steps will be taken to prevent infection. What happens during the procedure?  An IV tube will be put into one of your veins. You will be given a  medicine to help you relax. The skin on your neck or groin will be numbed. A cut (incision) will be made in your neck or groin. A needle will be put through your cut and into a large vein. A tube (catheter) will be put into the needle. The tube will be moved to your heart. Dye may be put through the tube. This helps your doctor see your heart. Small devices (electrodes) on the tube will send out signals. A type of energy will be used to destroy some heart tissue. The tube will be taken out. Pressure will be held on your cut. This helps stop bleeding. A bandage will be put over your cut. The exact procedure may vary among doctors and hospitals. What happens after the procedure? You will be watched until you leave the hospital or clinic. This includes checking your heart rate, breathing rate, oxygen, and blood pressure. Your cut will be watched for bleeding. You will need to lie still for a few hours. Do not drive for 24 hours or as long as your doctor tells you. Summary Cardiac ablation is a procedure to destroy some heart tissue. This is done to treat heart rhythm problems. Tell your doctor about any medical conditions you may have. Tell him or her about all medicines you are taking to treat them. This is a safe procedure. But problems may occur. These include infection, bruising, bleeding, and damage to nearby areas of your body. Follow what your doctor tells you about food and drink. You may also be told to change or stop some of your medicines. After the procedure, do not drive for 24 hours or as long as your doctor tells you. This information is not intended to replace advice given to you by your health care provider. Make sure you discuss any questions you have with your health care provider. Document Revised: 12/28/2021 Document Reviewed: 09/09/2019 Elsevier Patient Education  [place]

## 2022-05-31 ENCOUNTER — Ambulatory Visit (HOSPITAL_COMMUNITY): Payer: Managed Care, Other (non HMO)

## 2022-05-31 ENCOUNTER — Ambulatory Visit (INDEPENDENT_AMBULATORY_CARE_PROVIDER_SITE_OTHER): Payer: Self-pay | Admitting: Physician Assistant

## 2022-05-31 DIAGNOSIS — Z01810 Encounter for preprocedural cardiovascular examination: Secondary | ICD-10-CM

## 2022-05-31 NOTE — Progress Notes (Signed)
   Patient Name: [name]  DOB: 02-13-1966 MRN: [id number]  Primary Cardiologist: None  Chart reviewed as part of pre-operative protocol coverage. Pre-op clearance already addressed by colleagues in earlier phone notes. To summarize recommendations:  -Ms. Buhrman should be okay to proceed with her EGD.  I would recommend continuing the metoprolol around the time of the EGD procedure which is tentatively scheduled for next week. -Dr. Quentin Ore (seen 05/27/2022)  Will route this bundled recommendation to requesting provider via Epic fax function and remove from pre-op pool. Please call with questions.  Elgie Collard, PA-C 05/31/2022, 10:00 AM

## 2022-06-02 ENCOUNTER — Other Ambulatory Visit: Payer: Self-pay | Admitting: Gastroenterology

## 2022-06-02 ENCOUNTER — Encounter: Payer: Self-pay | Admitting: Certified Registered Nurse Anesthetist

## 2022-06-03 ENCOUNTER — Other Ambulatory Visit: Payer: Managed Care, Other (non HMO)

## 2022-06-03 DIAGNOSIS — I471 Supraventricular tachycardia: Secondary | ICD-10-CM

## 2022-06-03 DIAGNOSIS — Z720 Tobacco use: Secondary | ICD-10-CM

## 2022-06-03 DIAGNOSIS — Z01818 Encounter for other preprocedural examination: Secondary | ICD-10-CM

## 2022-06-03 DIAGNOSIS — I10 Essential (primary) hypertension: Secondary | ICD-10-CM

## 2022-06-03 LAB — BASIC METABOLIC PANEL
BUN/Creatinine Ratio: 10 (ref 9–23)
BUN: 7 mg/dL (ref 6–24)
CO2: 25 mmol/L (ref 20–29)
Calcium: 9.4 mg/dL (ref 8.7–10.2)
Chloride: 108 mmol/L — ABNORMAL HIGH (ref 96–106)
Creatinine, Ser: 0.67 mg/dL (ref 0.57–1.00)
Glucose: 106 mg/dL — ABNORMAL HIGH (ref 70–99)
Potassium: 3.7 mmol/L (ref 3.5–5.2)
Sodium: 142 mmol/L (ref 134–144)
eGFR: 103 mL/min/{1.73_m2} (ref >59)

## 2022-06-03 LAB — CBC WITH DIFFERENTIAL/PLATELET
Basophils Absolute: 0 10*3/uL (ref 0.0–0.2)
Basos: 0 %
EOS (ABSOLUTE): 0.2 10*3/uL (ref 0.0–0.4)
Eos: 2 %
Hematocrit: 34.6 % (ref 34.0–46.6)
Hemoglobin: 11.6 g/dL (ref 11.1–15.9)
Lymphocytes Absolute: 3.3 10*3/uL — ABNORMAL HIGH (ref 0.7–3.1)
Lymphs: 37 %
MCH: 27.8 pg (ref 26.6–33.0)
MCHC: 33.5 g/dL (ref 31.5–35.7)
MCV: 83 fL (ref 79–97)
Monocytes Absolute: 0.7 10*3/uL (ref 0.1–0.9)
Monocytes: 8 %
Neutrophils Absolute: 4.6 10*3/uL (ref 1.4–7.0)
Neutrophils: 53 %
Platelets: 397 10*3/uL (ref 150–450)
RBC: 4.17 x10E6/uL (ref 3.77–5.28)
RDW: 16.3 % — ABNORMAL HIGH (ref 11.7–15.4)
WBC: 8.8 10*3/uL (ref 3.4–10.8)

## 2022-06-10 ENCOUNTER — Telehealth: Payer: Self-pay

## 2022-06-10 ENCOUNTER — Ambulatory Visit: Payer: Managed Care, Other (non HMO) | Admitting: Gastroenterology

## 2022-06-10 ENCOUNTER — Encounter: Payer: Self-pay | Admitting: Gastroenterology

## 2022-06-10 VITALS — BP 161/89 | HR 78 | Temp 97.3°F | Resp 13 | Ht 62.0 in | Wt 168.0 lb

## 2022-06-10 DIAGNOSIS — K269 Duodenal ulcer, unspecified as acute or chronic, without hemorrhage or perforation: Secondary | ICD-10-CM

## 2022-06-10 DIAGNOSIS — K2980 Duodenitis without bleeding: Secondary | ICD-10-CM | POA: Diagnosis not present

## 2022-06-10 DIAGNOSIS — R0789 Other chest pain: Secondary | ICD-10-CM

## 2022-06-10 DIAGNOSIS — K3189 Other diseases of stomach and duodenum: Secondary | ICD-10-CM

## 2022-06-10 DIAGNOSIS — R059 Cough, unspecified: Secondary | ICD-10-CM | POA: Diagnosis not present

## 2022-06-10 DIAGNOSIS — K317 Polyp of stomach and duodenum: Secondary | ICD-10-CM

## 2022-06-10 DIAGNOSIS — K319 Disease of stomach and duodenum, unspecified: Secondary | ICD-10-CM

## 2022-06-10 DIAGNOSIS — R140 Abdominal distension (gaseous): Secondary | ICD-10-CM

## 2022-06-10 MED ORDER — SODIUM CHLORIDE 0.9 % IV SOLN: 500.0000 mL | Freq: Once | INTRAVENOUS | Status: DC

## 2022-06-10 NOTE — Op Note (Signed)
[place] Patient Name: Lauren [name] Procedure Date: 06/10/2022 1:03 PM MRN: [id number] Endoscopist: Thornton Park MD, MD Age: 56 Referring MD:  Date of Birth: 01-30-1966 Gender: Female Account #: [id number] Procedure:                Upper GI endoscopy Indications:              Abdominal bloating, Chest pain (non cardiac),                            Chronic cough, no improvement with empiric PPI                            therapy for possible reflux Medicines:                Monitored Anesthesia Care Procedure:                Pre-Anesthesia Assessment:                           - Prior to the procedure, a History and Physical                            was performed, and patient medications and                            allergies were reviewed. The patient's tolerance of                            previous anesthesia was also reviewed. The risks                            and benefits of the procedure and the sedation                            options and risks were discussed with the patient.                            All questions were answered, and informed consent                            was obtained. Prior Anticoagulants: The patient has                            taken no previous anticoagulant or antiplatelet                            agents. ASA Grade Assessment: II - A patient with                            mild systemic disease. After reviewing the risks                            and benefits, the patient was deemed in  satisfactory condition to undergo the procedure.                           After obtaining informed consent, the endoscope was                            passed under direct vision. Throughout the                            procedure, the patient's blood pressure, pulse, and                            oxygen saturations were monitored continuously. The                            Endoscope was introduced  through the mouth, and                            advanced to the third part of duodenum. The upper                            GI endoscopy was accomplished without difficulty.                            The patient tolerated the procedure well. Scope In: Scope Out: Findings:                 The examined esophagus was normal. The z-line is                            located 38 cm from the incisors. Biopsies were                            obtained from the mid/proximal and distal esophagus                            with cold forceps for histology.                           The gastric mucosa normal. Biopsies were taken from                            the antrum, body, and fundus with a cold forceps                            for histology. Estimated blood loss was minimal.                           A single 7 mm firm, submucosal nodule with                            overlying normal mucosa was found in the gastric  body. It was firm. No pillow sign. Tunneled                            biopsies for histology were taken with a cold                            forceps for evaluation of celiac disease. Estimated                            blood loss was minimal.                           The examined duodenum showed focal erythema.                            Biopsies were taken with a cold forceps for                            histology. Estimated blood loss was minimal.                           The cardia and gastric fundus were normal on                            retroflexion.                           The exam was otherwise without abnormality. Complications:            No immediate complications. Estimated Blood Loss:     Estimated blood loss was minimal. Impression:               - Normal esophagus. Biopsied.                           - Normal stomach. Biopsied.                           - A single gastric submucosal nodule. Biopsied.                            - Duodenitis. Biopsied.                           - The examination was otherwise normal. Recommendation:           - Patient has a contact number available for                            emergencies. The signs and symptoms of potential                            delayed complications were discussed with the                            patient. Return to normal activities tomorrow.  Written discharge instructions were provided to the                            patient.                           - Resume previous diet.                           - Continue present medications.                           - Await pathology results. Thornton Park MD, MD 06/10/2022 1:51:52 PM This report has been signed electronically.

## 2022-06-10 NOTE — Telephone Encounter (Signed)
-----   Message from Thornton Park, MD sent at 06/10/2022  1:50 PM EDT ----- Plan outpatient follow-up with me or any APP - next available.   Thanks.  KLB

## 2022-06-10 NOTE — Telephone Encounter (Signed)
Pt scheduled for f/u with Nevin Bloodgood, NP on 07/10/22 at 11:30 am. Pt notified via mychart.

## 2022-06-10 NOTE — Progress Notes (Signed)
Report given to PACU, vss 

## 2022-06-10 NOTE — Patient Instructions (Signed)
YOU HAD AN ENDOSCOPIC PROCEDURE TODAY AT THE [facility]:   Refer to the procedure report that was given to you for any specific questions about what was found during the examination.  If the procedure report does not answer your questions, please call your gastroenterologist to clarify.  If you requested that your care partner not be given the details of your procedure findings, then the procedure report has been included in a sealed envelope for you to review at your convenience later.  YOU SHOULD EXPECT: Some feelings of bloating in the abdomen. Passage of more gas than usual.  Walking can help get rid of the air that was put into your GI tract during the procedure and reduce the bloating. If you had a lower endoscopy (such as a colonoscopy or flexible sigmoidoscopy) you may notice spotting of blood in your stool or on the toilet paper. If you underwent a bowel prep for your procedure, you may not have a normal bowel movement for a few days.  Please Note:  You might notice some irritation and congestion in your nose or some drainage.  This is from the oxygen used during your procedure.  There is no need for concern and it should clear up in a day or so.  SYMPTOMS TO REPORT IMMEDIATELY:  Following upper endoscopy (EGD)  Vomiting of blood or coffee ground material  New chest pain or pain under the shoulder blades  Painful or persistently difficult swallowing  New shortness of breath  Fever of 100F or higher  Black, tarry-looking stools  For urgent or emergent issues, a gastroenterologist can be reached at any hour by calling [phone]. Do not use MyChart messaging for urgent concerns.    DIET:  We do recommend a small meal at first, but then you may proceed to your regular diet.  Drink plenty of fluids but you should avoid alcoholic beverages for 24 hours.  ACTIVITY:  You should plan to take it easy for the rest of today and you should NOT DRIVE or use heavy machinery until  tomorrow (because of the sedation medicines used during the test).    FOLLOW UP: Our staff will call the number listed on your records the next business day following your procedure.  We will call around 7:15- 8:00 am to check on you and address any questions or concerns that you may have regarding the information given to you following your procedure. If we do not reach you, we will leave a message.  If you develop any symptoms (ie: fever, flu-like symptoms, shortness of breath, cough etc.) before then, please call [phone].  If you test positive for Covid 19 in the 2 weeks post procedure, please call and report this information to us.    If any biopsies were taken you will be contacted by phone or by letter within the next 1-3 weeks.  Please call us at [phone] if you have not heard about the biopsies in 3 weeks.    SIGNATURES/CONFIDENTIALITY: You and/or your care partner have signed paperwork which will be entered into your electronic medical record.  These signatures attest to the fact that that the information above on your After Visit Summary has been reviewed and is understood.  Full responsibility of the confidentiality of this discharge information lies with you and/or your care-partner.  

## 2022-06-10 NOTE — Progress Notes (Signed)
Indication for Procedure:  Cough, atypical chest pain, bloating   IMPRESSION:  Cough when supine    - Likely due to reflux    - worsened when new PCP switched PPI and lowered the dose Non-cardiac chest pain with recent SVT    - awaiting cardiac evaluation    - must considered referred esophageal sources if cardiac evaluation is negative Bloating, intermittent    - etiology unclear Altered appetite History of colon polyps    - TA and SSP on colonoscopy 2020    - Surveillance colonoscopy due 2025 Appropriate candidate for monitored anesthesia care  PLAN: EGD in the [place] today   HPI: Lauren [name] is a 56 y.o. female presents for screening colonoscopy.  Reflux symptoms despite omeprazole 40 mg QD predominantly presenting with heartburn, brash, and cough when supine.  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 She avoid late night eating but sometimes reclines soon after eating. She often only has time to eat one meal daily and thinks this may be contributing.   Intermittent bloating not related to eating x 2 months. Symptoms fluctuate dramatically from day to day.    Lower abdominal pain x 2 weeks. Symptoms are not as serious or frequent.    Intermittent diarrhea with urgency but without accidents.   Appetite was low but it recently improved 2 weeks ago. Weight fluctuates.    Dr. Raul Del thought it could be related to reflux. Despite prescription medications, she will have nocturnal coughing.    Smokes 4-5 menthol cigarettes daily. Denies marijuana, alcohol, or street drugs.    Ibuprofen for sciatica in the past - but has needed it since her injection She was previously on high dose PPI. When she switched PCP they reduced the dose and the medication.    Sister has intermittent abdominal distention. Mother with long history of GI problems.  There is no known family history of colon cancer or polyps. No family history of stomach cancer or  other GI malignancy. No family history of inflammatory bowel disease or celiac.    Colonoscopy 05/04/2019 showed 3 small polyps including a tubular adenoma, sessile serrate polyp, and hyperplastic polyp.    No recent abdominal imaging. No prior upper endoscopy.    Labs 03/31/22 include glucose 128, normal CBC, negative troponin   Past Medical History:  Diagnosis Date   Anxiety    Arthritis    Depression    GERD (gastroesophageal reflux disease)    Hypertension    Prediabetes    Sleep apnea    Past hx - no cpap now    SVT (supraventricular tachycardia) ([place])     Past Surgical History:  Procedure Laterality Date   ABDOMINAL HYSTERECTOMY     BACK SURGERY     CESAREAN SECTION     COLONOSCOPY     HAND SURGERY     NECK SURGERY  2002   plate in her neck    Current Outpatient Medications  Medication Sig Dispense Refill   atorvastatin (LIPITOR) 10 MG tablet TAKE 1 TABLET (10 MG TOTAL) BY MOUTH DAILY. 30 tablet 1   busPIRone (BUSPAR) 15 MG tablet Take 15 mg by mouth 2 (two) times daily.     famotidine (PEPCID) 20 MG tablet TAKE 1 TABLET BY MOUTH EVERYDAY AT BEDTIME 30 tablet 0   lamoTRIgine (LAMICTAL) 25 MG tablet Take 25 mg by mouth daily.     lisinopril (ZESTRIL) 20 MG tablet TAKE 1 TABLET (20  MG TOTAL) BY MOUTH DAILY. 30 tablet 1   Misc. Devices MISC Please provide patient with insurance approved supplies for her CPAP: tubing, chin strap and supplies and filters. 1 each 0   Multiple Vitamin (MULTIVITAMIN) capsule Take by mouth.     omeprazole (PRILOSEC) 40 MG capsule Take 1 capsule (40 mg total) by mouth 2 (two) times daily before a meal. 180 capsule 3   traZODone (DESYREL) 100 MG tablet Take 0.5-1 tablets (50-100 mg total) by mouth at bedtime. 90 tablet 1   venlafaxine XR (EFFEXOR-XR) 150 MG 24 hr capsule Take 1 capsule (150 mg total) by mouth every morning. 30 capsule 1   albuterol (PROAIR HFA) 108 (90 Base) MCG/ACT inhaler Inhale 1-2 puffs into the lungs every 6 (six) hours as  needed for wheezing or shortness of breath. 8.5 g 2   aspirin EC 81 MG tablet Take 81 mg by mouth daily. Swallow whole.     benzonatate (TESSALON) 100 MG capsule Take 1 capsule (100 mg total) by mouth every 8 (eight) hours. 21 capsule 0   fluticasone (FLONASE) 50 MCG/ACT nasal spray Place 2 sprays into both nostrils daily.     ibuprofen (ADVIL) 600 MG tablet Take 1 tablet (600 mg total) by mouth every 8 (eight) hours as needed. 60 tablet 1   metoprolol tartrate (LOPRESSOR) 25 MG tablet Take 1 tablet (25 mg total) by mouth 2 (two) times daily. 60 tablet 0   Current Facility-Administered Medications  Medication Dose Route Frequency Provider Last Rate Last Admin   0.9 %  sodium chloride infusion  500 mL Intravenous Once Thornton Park, MD        Allergies as of 06/10/2022 - Review Complete 06/10/2022  Allergen Reaction Noted   Iodinated contrast media Nausea Only 02/02/2020    Family History  Problem Relation Age of Onset   Healthy Mother    Heart attack Father    Diabetes Father    Heart attack Sister    Colon cancer Neg Hx    Colon polyps Neg Hx    Esophageal cancer Neg Hx    Rectal cancer Neg Hx    Stomach cancer Neg Hx      Physical Exam: General:   Alert,  well-nourished, pleasant and cooperative in NAD Head:  Normocephalic and atraumatic. Eyes:  Sclera clear, no icterus.   Conjunctiva pink. Mouth:  No deformity or lesions.   Neck:  Supple; no masses or thyromegaly. Lungs:  Clear throughout to auscultation.   No wheezes. Heart:  Regular rate and rhythm; no murmurs. Abdomen:  Soft, non-tender, nondistended, normal bowel sounds, no rebound or guarding.  Msk:  Symmetrical. No boney deformities LAD: No inguinal or umbilical LAD Extremities:  No clubbing or edema. Neurologic:  Alert and  oriented x4;  grossly nonfocal Skin:  No obvious rash or bruise. Psych:  Alert and cooperative. Normal mood and affect.     Studies/Results: No results found.    Lauren Feagans L.  Lauren Glenn, MD, MPH 06/10/2022, 1:18 PM

## 2022-06-10 NOTE — Progress Notes (Signed)
1332 Robinul 0.1 mg IV given due large amount of secretions upon assessment.  MD made aware, vss

## 2022-06-10 NOTE — Progress Notes (Signed)
Called to room to assist during endoscopic procedure.  Patient ID and intended procedure confirmed with present staff. Received instructions for my participation in the procedure from the performing physician.  

## 2022-06-10 NOTE — Progress Notes (Signed)
Pt's states no medical or surgical changes since previsit or office visit. 

## 2022-06-11 ENCOUNTER — Telehealth: Payer: Self-pay | Admitting: *Deleted

## 2022-06-11 NOTE — Telephone Encounter (Signed)
  Follow up Call-     06/10/2022   12:37 PM  Call back number  Post procedure Call Back phone  # [phone]  Permission to leave phone message Yes     Patient questions:  Do you have a fever, pain , or abdominal swelling? No. Pain Score  0 *  Have you tolerated food without any problems? Yes.    Have you been able to return to your normal activities? Yes.    Do you have any questions about your discharge instructions: Diet   No. Medications  No. Follow up visit  No.  Do you have questions or concerns about your Care? no  Actions: * If pain score is 4 or above: No action needed, pain <4.

## 2022-06-11 NOTE — Telephone Encounter (Signed)
Called and left voicemail for the patient regarding her upcoming ablation.  Asked that she call the office back to confirm receiving voicemail and change of ablation date from 06/27/22 to 06/28/22.  She still needs to arrive at the hospital at 11:30 am on 06/28/22.

## 2022-06-12 NOTE — Telephone Encounter (Signed)
Appointment reminder mailed home. 

## 2022-06-17 ENCOUNTER — Encounter: Payer: Self-pay | Admitting: *Deleted

## 2022-06-17 NOTE — Telephone Encounter (Signed)
Pt calling to confirm appt change for Ablation. Pt would also like  to know if daughter is able to come pick up note for her being out of work. Please advise

## 2022-06-17 NOTE — Telephone Encounter (Signed)
Confirmed change in procedure date with the patient. She is requesting a note for her job about this change.  Will place a front desk.

## 2022-06-18 ENCOUNTER — Telehealth: Payer: Self-pay | Admitting: Gastroenterology

## 2022-06-18 NOTE — Telephone Encounter (Signed)
Patient is calling regarding her egd results, please advise. Thank you

## 2022-06-18 NOTE — Telephone Encounter (Signed)
Patient advised that it can take a couple weeks to hear back with path results & once Dr. Tarri Glenn has reviewed she will receive a phone call or letter through [facility]. Pt verbalized all understanding.

## 2022-06-21 ENCOUNTER — Encounter: Payer: Self-pay | Admitting: Nurse Practitioner

## 2022-06-21 ENCOUNTER — Ambulatory Visit (INDEPENDENT_AMBULATORY_CARE_PROVIDER_SITE_OTHER): Payer: Managed Care, Other (non HMO) | Admitting: Nurse Practitioner

## 2022-06-21 VITALS — BP 132/84 | HR 94 | Temp 98.5°F | Ht 62.0 in | Wt 169.4 lb

## 2022-06-21 DIAGNOSIS — I471 Supraventricular tachycardia: Secondary | ICD-10-CM | POA: Diagnosis not present

## 2022-06-21 DIAGNOSIS — G4733 Obstructive sleep apnea (adult) (pediatric): Secondary | ICD-10-CM | POA: Diagnosis not present

## 2022-06-21 DIAGNOSIS — E669 Obesity, unspecified: Secondary | ICD-10-CM | POA: Insufficient documentation

## 2022-06-21 NOTE — Progress Notes (Signed)
$'@Patient'J$  ID: Lauren [name], female    DOB: 08/19/1966, 56 y.o.   MRN: [id number]  Chief Complaint  Patient presents with   Consult    Referring provider: No ref. provider found  HPI: 56 year old female, active smoker referred for sleep consult.  Past medical history significant for GERD, depression, HLD, HTN.  TEST/EVENTS:  02/13/2018 NPSG: AHI 5.7, SpO2 low 87%. Weight 150 lb  06/21/2022: Today-sleep consult Patient presents today for sleep consult.  She was previously diagnosed with OSA around 10 years ago and was on CPAP therapy.  She ended up losing her insurance when she moved here and was working for a temp agency so had to return her CPAP.  She has been off of this since.  She was referred here in 2019 and saw Dr. Annamaria Boots.  She underwent in lab study which showed mild OSA with AHI 5.7. She was advised at that time that she no longer needed CPAP therapy and encouraged to focus on weight loss measures, per the patient.   Since then, she has been struggling with daytime fatigue symptoms; feels like they have gotten worse over the past year or two.  She also wakes up 3-4 times a night.  Occasionally feels like she wakes up gasping for air.  Wakes in the morning feeling poorly rested.  Has been told that she snores.  Struggles with morning headaches.  She denies any drowsy driving, sleep parasomnia/paralysis, cataplexy.  She goes to bed around 10 PM.  Takes 1 to 2 hours to fall asleep.  She was started on trazodone which seems to be helping with this.  She still wakes up multiple times at night, sometimes to use the restroom.  Officially gets out of the bed in the morning around 7 AM.  She has been struggling with SVT and is being followed by cardiology.  They are planning to do an ablation and also recommended that she be worked up for underlying sleep apnea.  She has gained about 20 lb since her previous sleep study in 2019.  She smokes half a pack a day.  Does not drink alcohol.  Lives  with her daughter.  She works as the Psychologist, educational at [employer].  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  Family history of asthma and heart disease.   Epworth 8   Allergies  Allergen Reactions   Iodinated Contrast Media Nausea Only    Immunization History  Administered Date(s) Administered   Tdap 06/14/2019    Past Medical History:  Diagnosis Date   Anxiety    Arthritis    Depression    GERD (gastroesophageal reflux disease)    Hypertension    Prediabetes    Sleep apnea    Past hx - no cpap now    SVT (supraventricular tachycardia) (HCC)     Tobacco History: Social History   Tobacco Use  Smoking Status Every Day   Packs/day: 0.50   Years: 20.00   Total pack years: 10.00   Types: Cigarettes   Passive exposure: Current  Smokeless Tobacco Never  Tobacco Comments   Smokes about 9 ciggs daily 06/21/22 LW   Ready to quit: Not Answered Counseling given: Not Answered Tobacco comments: Smokes about 9 ciggs daily 06/21/22 LW   Outpatient Medications Prior to Visit  Medication Sig Dispense Refill   albuterol (PROAIR HFA) 108 (90 Base) MCG/ACT inhaler Inhale 1-2 puffs into the lungs every 6 (six) hours as needed for wheezing or shortness  of breath. 8.5 g 2   aspirin EC 81 MG tablet Take 81 mg by mouth daily. Swallow whole.     atorvastatin (LIPITOR) 10 MG tablet TAKE 1 TABLET (10 MG TOTAL) BY MOUTH DAILY. 30 tablet 1   busPIRone (BUSPAR) 15 MG tablet Take 15 mg by mouth 2 (two) times daily.     famotidine (PEPCID) 20 MG tablet TAKE 1 TABLET BY MOUTH EVERYDAY AT BEDTIME 30 tablet 0   lamoTRIgine (LAMICTAL) 25 MG tablet Take 25 mg by mouth 2 (two) times daily.     lisinopril (ZESTRIL) 20 MG tablet TAKE 1 TABLET (20 MG TOTAL) BY MOUTH DAILY. 30 tablet 1   metoprolol tartrate (LOPRESSOR) 25 MG tablet Take 1 tablet (25 mg total) by mouth 2 (two) times daily. 60 tablet 0   Misc. Devices MISC Please provide patient with insurance approved supplies for  her CPAP: tubing, chin strap and supplies and filters. 1 each 0   Multiple Vitamin (MULTIVITAMIN) capsule Take 1 capsule by mouth daily.     omeprazole (PRILOSEC) 40 MG capsule Take 1 capsule (40 mg total) by mouth 2 (two) times daily before a meal. 180 capsule 3   traZODone (DESYREL) 100 MG tablet Take 0.5-1 tablets (50-100 mg total) by mouth at bedtime. (Patient taking differently: Take 100 mg by mouth at bedtime.) 90 tablet 1   venlafaxine XR (EFFEXOR-XR) 150 MG 24 hr capsule Take 1 capsule (150 mg total) by mouth every morning. 30 capsule 1   benzonatate (TESSALON) 100 MG capsule Take 1 capsule (100 mg total) by mouth every 8 (eight) hours. (Patient not taking: Reported on 06/21/2022) 21 capsule 0   fluticasone (FLONASE) 50 MCG/ACT nasal spray Place 2 sprays into both nostrils daily as needed (Snus infection).     No facility-administered medications prior to visit.     Review of Systems:   Constitutional: No night sweats, fevers, chills. +excessive daytime fatigue, weight gain (20 lb) HEENT: No difficulty swallowing, tooth/dental problems, or sore throat. No sneezing, itching, ear ache, nasal congestion, or post nasal drip. +morning headaches CV:  +palpitations. No chest pain, orthopnea, PND, swelling in lower extremities, anasarca, dizziness, syncope Resp: +snoring; occasional shortness of breath with exertion (related to SVT). No excess mucus or change in color of mucus. No productive or non-productive. No hemoptysis. No wheezing.  No chest wall deformity GI:  No heartburn, indigestion, abdominal pain, nausea, vomiting, diarrhea, change in bowel habits, loss of appetite, bloody stools.  GU: No dysuria, change in color of urine, urgency or frequency.  No flank pain, no hematuria  Skin: No rash, lesions, ulcerations MSK:  No joint pain or swelling.  No decreased range of motion.  No back pain. Neuro: No dizziness or lightheadedness.  Psych: No depression or anxiety. Mood stable. +sleep  disturbance    Physical Exam:  BP 132/84 (BP Location: Right Arm, Patient Position: Sitting, Cuff Size: Normal)   Pulse 94   Temp 98.5 F (36.9 C) (Oral)   Ht '5\' 2"'$  (1.575 m)   Wt 169 lb 6.4 oz (76.8 kg)   SpO2 97%   BMI 30.98 kg/m   GEN: Pleasant, interactive, well-appearing; obese; in no acute distress HEENT:  Normocephalic and atraumatic. PERRLA. Sclera white. Nasal turbinates pink, moist and patent bilaterally. No rhinorrhea present. Oropharynx pink and moist, without exudate or edema. No lesions, ulcerations, or postnasal drip. Mallampati II NECK:  Supple w/ fair ROM. No JVD present. Normal carotid impulses w/o bruits. Thyroid symmetrical with no goiter or  nodules palpated. No lymphadenopathy.   CV: RRR, no m/r/g, no peripheral edema. Pulses intact, +2 bilaterally. No cyanosis, pallor or clubbing. PULMONARY:  Unlabored, regular breathing. Clear bilaterally A&P w/o wheezes/rales/rhonchi. No accessory muscle use. No dullness to percussion. GI: BS present and normoactive. Soft, non-tender to palpation. No organomegaly or masses detected. No CVA tenderness. MSK: No erythema, warmth or tenderness. Cap refil <2 sec all extrem. No deformities or joint swelling noted.  Neuro: A/Ox3. No focal deficits noted.   Skin: Warm, no lesions or rashe Psych: Normal affect and behavior. Judgement and thought content appropriate.     Lab Results:  CBC    Component Value Date/Time   WBC 8.8 06/03/2022 1327   WBC 9.1 03/31/2022 1814   RBC 4.17 06/03/2022 1327   RBC 4.51 03/31/2022 1814   HGB 11.6 06/03/2022 1327   HCT 34.6 06/03/2022 1327   PLT 397 06/03/2022 1327   MCV 83 06/03/2022 1327   MCH 27.8 06/03/2022 1327   MCH 27.7 03/31/2022 1814   MCHC 33.5 06/03/2022 1327   MCHC 32.5 03/31/2022 1814   RDW 16.3 (H) 06/03/2022 1327   LYMPHSABS 3.3 (H) 06/03/2022 1327   MONOABS 0.6 02/11/2021 1732   EOSABS 0.2 06/03/2022 1327   BASOSABS 0.0 06/03/2022 1327    BMET    Component Value  Date/Time   NA 142 06/03/2022 1327   K 3.7 06/03/2022 1327   CL 108 (H) 06/03/2022 1327   CO2 25 06/03/2022 1327   GLUCOSE 106 (H) 06/03/2022 1327   GLUCOSE 128 (H) 03/31/2022 1814   BUN 7 06/03/2022 1327   CREATININE 0.67 06/03/2022 1327   CALCIUM 9.4 06/03/2022 1327   GFRNONAA >60 03/31/2022 1814   GFRAA 118 10/31/2020 1455    BNP No results found for: "BNP"   Imaging:  No results found.        No data to display          No results found for: "NITRICOXIDE"      Assessment & Plan:   OSA (obstructive sleep apnea) She has snoring, excessive daytime sleepiness, morning headaches, frequent night awakenings. BMI 30.9. History of OSA and cardiac arrhythmias. Increased weight 20 lb since last sleep study in 2019. Epworth 8. Given this,  I am concerned she could have sleep disordered breathing with obstructive sleep apnea. She will need sleep study for further evaluation.    - discussed how weight can impact sleep and risk for sleep disordered breathing - discussed options to assist with weight loss: combination of diet modification, cardiovascular and strength training exercises   - had an extensive discussion regarding the adverse health consequences related to untreated sleep disordered breathing - specifically discussed the risks for hypertension, coronary artery disease, cardiac dysrhythmias, cerebrovascular disease, and diabetes - lifestyle modification discussed   - discussed how sleep disruption can increase risk of accidents, particularly when driving - safe driving practices were discussed  Patient Instructions  Given your symptoms and history, I am concerned that you still have sleep disordered breathing with sleep apnea. Home sleep study ordered for further evaluation. Someone will contact you for scheduling.   We discussed how untreated sleep apnea puts an individual at risk for cardiac arrhthymias, pulm HTN, DM, stroke and increases their risk for  daytime accidents. We also briefly reviewed treatment options including weight loss, side sleeping position, oral appliance, CPAP therapy or referral to ENT for possible surgical options  Follow up in 6-8 weeks with Katie Antwoine Zorn,NP or sooner if needed.  If you have not had your sleep study, please call to reschedule     Obesity (BMI 30-39.9) Healthy weight loss encouraged.   Paroxysmal SVT (supraventricular tachycardia) (HCC) Regular rate and rhythm today. Follow up with cardiology as scheduled.   I spent 38 minutes of dedicated to the care of this patient on the date of this encounter to include pre-visit review of records, face-to-face time with the patient discussing conditions above, post visit ordering of testing, clinical documentation with the electronic health record, making appropriate referrals as documented, and communicating necessary findings to members of the patients care team.  Clayton Bibles, NP 06/21/2022  Pt aware and understands NP's role.

## 2022-06-21 NOTE — Patient Instructions (Addendum)
Given your symptoms and history, I am concerned that you still have sleep disordered breathing with sleep apnea. Home sleep study ordered for further evaluation. Someone will contact you for scheduling.   We discussed how untreated sleep apnea puts an individual at risk for cardiac arrhthymias, pulm HTN, DM, stroke and increases their risk for daytime accidents. We also briefly reviewed treatment options including weight loss, side sleeping position, oral appliance, CPAP therapy or referral to ENT for possible surgical options  Follow up in 6-8 weeks with Katie Ozie Dimaria,NP or sooner if needed. If you have not had your sleep study, please call to reschedule

## 2022-06-21 NOTE — Assessment & Plan Note (Signed)
Healthy weight loss encouraged 

## 2022-06-21 NOTE — Assessment & Plan Note (Signed)
Regular rate and rhythm today. Follow up with cardiology as scheduled.

## 2022-06-21 NOTE — Assessment & Plan Note (Addendum)
She has snoring, excessive daytime sleepiness, morning headaches, frequent night awakenings. BMI 30.9. History of OSA and cardiac arrhythmias. Increased weight 20 lb since last sleep study in 2019. Epworth 8. Given this,  I am concerned she could have sleep disordered breathing with obstructive sleep apnea. She will need sleep study for further evaluation.    - discussed how weight can impact sleep and risk for sleep disordered breathing - discussed options to assist with weight loss: combination of diet modification, cardiovascular and strength training exercises   - had an extensive discussion regarding the adverse health consequences related to untreated sleep disordered breathing - specifically discussed the risks for hypertension, coronary artery disease, cardiac dysrhythmias, cerebrovascular disease, and diabetes - lifestyle modification discussed   - discussed how sleep disruption can increase risk of accidents, particularly when driving - safe driving practices were discussed  Patient Instructions  Given your symptoms and history, I am concerned that you still have sleep disordered breathing with sleep apnea. Home sleep study ordered for further evaluation. Someone will contact you for scheduling.   We discussed how untreated sleep apnea puts an individual at risk for cardiac arrhthymias, pulm HTN, DM, stroke and increases their risk for daytime accidents. We also briefly reviewed treatment options including weight loss, side sleeping position, oral appliance, CPAP therapy or referral to ENT for possible surgical options  Follow up in 6-8 weeks with Lauren Kayleah Appleyard,NP or sooner if needed. If you have not had your sleep study, please call to reschedule

## 2022-06-25 ENCOUNTER — Encounter: Payer: Self-pay | Admitting: Cardiology

## 2022-06-25 NOTE — Progress Notes (Signed)
Reviewed and agree with assessment/plan.   Chesley Mires, MD [facility] 06/25/2022, 8:09 AM Pager:  [phone]

## 2022-06-27 NOTE — Pre-Procedure Instructions (Signed)
Attempted to call patient regarding procedure instructions.  Left voicemail on the following items: Arrival time 1130 Nothing to eat or drink after midnight No meds AM of procedure Responsible person to drive you home and stay with you for 24 hrs

## 2022-06-27 NOTE — Anesthesia Preprocedure Evaluation (Addendum)
Anesthesia Evaluation  Patient identified by MRN, date of birth, ID band Patient awake    Reviewed: Allergy & Precautions, NPO status , Patient's Chart, lab work & pertinent test results  Airway Mallampati: II  TM Distance: >3 FB Neck ROM: Full    Dental no notable dental hx. (+) Missing,    Pulmonary sleep apnea , Current Smoker and Patient abstained from smoking.,    Pulmonary exam normal breath sounds clear to auscultation       Cardiovascular hypertension, Normal cardiovascular exam+ dysrhythmias Supra Ventricular Tachycardia  Rhythm:Regular Rate:Normal     Neuro/Psych    GI/Hepatic Neg liver ROS, GERD  ,  Endo/Other    Renal/GU Lab Results      Component                Value               Date                      CREATININE               0.67                06/03/2022                K                        3.7                 06/03/2022                   Musculoskeletal  (+) Arthritis ,   Abdominal (+) + obese (BMI 31),   Peds  Hematology Lab Results      Component                Value               Date                           HGB                      11.6                06/03/2022                HCT                      34.6                06/03/2022                  PLT                      397                 06/03/2022              Anesthesia Other Findings   Reproductive/Obstetrics                           Anesthesia Physical Anesthesia Plan  ASA: 2  Anesthesia Plan: MAC   Post-op Pain Management: Minimal or no pain anticipated   Induction: Intravenous  PONV Risk Score and Plan: 3 and Treatment may vary due  to age or medical condition, Ondansetron, Midazolam and TIVA  Airway Management Planned: Nasal Cannula, Natural Airway and Simple Face Mask  Additional Equipment: None  Intra-op Plan:   Post-operative Plan:   Informed Consent: I have reviewed the patients  History and Physical, chart, labs and discussed the procedure including the risks, benefits and alternatives for the proposed anesthesia with the patient or authorized representative who has indicated his/her understanding and acceptance.     Dental advisory given  Plan Discussed with:   Anesthesia Plan Comments:        Anesthesia Quick Evaluation

## 2022-06-28 ENCOUNTER — Ambulatory Visit (HOSPITAL_COMMUNITY)
Admission: RE | Disposition: A | Discharge status: Home / Self Care | Payer: Managed Care, Other (non HMO) | Source: Home / Self Care | Attending: Cardiology

## 2022-06-28 ENCOUNTER — Ambulatory Visit (HOSPITAL_BASED_OUTPATIENT_CLINIC_OR_DEPARTMENT_OTHER): Payer: Managed Care, Other (non HMO) | Admitting: Anesthesiology

## 2022-06-28 ENCOUNTER — Ambulatory Visit (HOSPITAL_COMMUNITY): Payer: Managed Care, Other (non HMO) | Admitting: Anesthesiology

## 2022-06-28 ENCOUNTER — Ambulatory Visit (HOSPITAL_COMMUNITY)
Admission: RE | Admit: 2022-06-28 | Discharge: 2022-06-28 | Disposition: A | Discharge status: Home / Self Care | Payer: Managed Care, Other (non HMO) | Attending: Cardiology | Admitting: Cardiology

## 2022-06-28 ENCOUNTER — Encounter (HOSPITAL_COMMUNITY): Payer: Self-pay | Admitting: Cardiology

## 2022-06-28 ENCOUNTER — Other Ambulatory Visit: Payer: Self-pay

## 2022-06-28 DIAGNOSIS — I471 Supraventricular tachycardia: Secondary | ICD-10-CM | POA: Diagnosis present

## 2022-06-28 DIAGNOSIS — K219 Gastro-esophageal reflux disease without esophagitis: Secondary | ICD-10-CM | POA: Diagnosis not present

## 2022-06-28 DIAGNOSIS — I10 Essential (primary) hypertension: Secondary | ICD-10-CM

## 2022-06-28 DIAGNOSIS — F17210 Nicotine dependence, cigarettes, uncomplicated: Secondary | ICD-10-CM | POA: Diagnosis not present

## 2022-06-28 DIAGNOSIS — M1990 Unspecified osteoarthritis, unspecified site: Secondary | ICD-10-CM | POA: Diagnosis not present

## 2022-06-28 HISTORY — PX: SVT ABLATION: EP1225

## 2022-06-28 SURGERY — SVT ABLATION
Anesthesia: Monitor Anesthesia Care

## 2022-06-28 MED ORDER — ISOPROTERENOL HCL 0.2 MG/ML IJ SOLN
INTRAMUSCULAR | Status: AC
  Filled 2022-06-28: qty 5

## 2022-06-28 MED ORDER — HEPARIN SODIUM (PORCINE) 1000 UNIT/ML IJ SOLN
INTRAMUSCULAR | Status: AC
  Filled 2022-06-28: qty 10

## 2022-06-28 MED ORDER — METOPROLOL TARTRATE 5 MG/5ML IV SOLN
INTRAVENOUS | Status: DC | PRN
  Administered 2022-06-28: 5 mg via INTRAVENOUS

## 2022-06-28 MED ORDER — ONDANSETRON HCL 4 MG/2ML IJ SOLN: 4.0000 mg | Freq: Four times a day (QID) | INTRAMUSCULAR | Status: DC | PRN

## 2022-06-28 MED ORDER — BUPIVACAINE HCL (PF) 0.25 % IJ SOLN
INTRAMUSCULAR | Status: AC
Start: 2022-06-28 — End: ?
  Filled 2022-06-28: qty 30

## 2022-06-28 MED ORDER — ACETAMINOPHEN 325 MG PO TABS: 650.0000 mg | ORAL_TABLET | ORAL | Status: DC | PRN

## 2022-06-28 MED ORDER — ONDANSETRON HCL 4 MG/2ML IJ SOLN
INTRAMUSCULAR | Status: DC | PRN
  Administered 2022-06-28: 4 mg via INTRAVENOUS

## 2022-06-28 MED ORDER — SODIUM CHLORIDE 0.9 % IV SOLN: 250.0000 mL | INTRAVENOUS | Status: DC | PRN

## 2022-06-28 MED ORDER — PROPOFOL 500 MG/50ML IV EMUL
INTRAVENOUS | Status: DC | PRN
  Administered 2022-06-28: 75 ug/kg/min via INTRAVENOUS

## 2022-06-28 MED ORDER — SODIUM CHLORIDE 0.9% FLUSH: 3.0000 mL | Freq: Two times a day (BID) | INTRAVENOUS | Status: DC

## 2022-06-28 MED ORDER — ACETAMINOPHEN 500 MG PO TABS
1000.0000 mg | ORAL_TABLET | Freq: Once | ORAL | Status: AC
  Administered 2022-06-28: 1000 mg via ORAL
  Filled 2022-06-28: qty 2

## 2022-06-28 MED ORDER — METOPROLOL TARTRATE 5 MG/5ML IV SOLN
INTRAVENOUS | Status: AC
  Filled 2022-06-28: qty 5

## 2022-06-28 MED ORDER — ISOPROTERENOL HCL 0.2 MG/ML IJ SOLN
INTRAVENOUS | Status: DC | PRN
  Administered 2022-06-28: 2 ug/min via INTRAVENOUS

## 2022-06-28 MED ORDER — MIDAZOLAM HCL 2 MG/2ML IJ SOLN
INTRAMUSCULAR | Status: DC | PRN
  Administered 2022-06-28: 2 mg via INTRAVENOUS

## 2022-06-28 MED ORDER — PROPOFOL 10 MG/ML IV BOLUS
INTRAVENOUS | Status: DC | PRN
  Administered 2022-06-28: 20 mg via INTRAVENOUS

## 2022-06-28 MED ORDER — SODIUM CHLORIDE 0.9% FLUSH: 3.0000 mL | INTRAVENOUS | Status: DC | PRN

## 2022-06-28 MED ORDER — FENTANYL CITRATE (PF) 100 MCG/2ML IJ SOLN
INTRAMUSCULAR | Status: DC | PRN
Start: 2022-06-28 — End: 2022-06-28
  Administered 2022-06-28: 50 ug via INTRAVENOUS
  Administered 2022-06-28 (×2): 25 ug via INTRAVENOUS

## 2022-06-28 MED ORDER — BUPIVACAINE HCL (PF) 0.25 % IJ SOLN
INTRAMUSCULAR | Status: DC | PRN
  Administered 2022-06-28: 30 mL

## 2022-06-28 MED ORDER — HEPARIN (PORCINE) IN NACL 1000-0.9 UT/500ML-% IV SOLN
INTRAVENOUS | Status: DC | PRN
  Administered 2022-06-28 (×2): 500 mL

## 2022-06-28 MED ORDER — METOPROLOL TARTRATE 25 MG PO TABS
25.0000 mg | ORAL_TABLET | Freq: Two times a day (BID) | ORAL | Status: DC
  Administered 2022-06-28: 25 mg via ORAL
  Filled 2022-06-28: qty 1

## 2022-06-28 MED ORDER — SODIUM CHLORIDE 0.9 % IV SOLN: INTRAVENOUS | Status: DC

## 2022-06-28 MED ORDER — METOPROLOL TARTRATE 5 MG/5ML IV SOLN
INTRAVENOUS | Status: AC
Start: 2022-06-28 — End: ?
  Filled 2022-06-28: qty 5

## 2022-06-28 SURGICAL SUPPLY — 14 items
CATH CRD2 QUAD 6FR REP (CATHETERS) IMPLANT
CATH DECANAV F CURVE (CATHETERS) IMPLANT
CATH JOSEPH QUAD ALLRED 6F REP (CATHETERS) IMPLANT
CATH SMTCH THERMOCOOL SF DF (CATHETERS) IMPLANT
CLOSURE PERCLOSE PROSTYLE (VASCULAR PRODUCTS) IMPLANT
PACK EP LATEX FREE (CUSTOM PROCEDURE TRAY) ×1
PACK EP LF (CUSTOM PROCEDURE TRAY) ×1 IMPLANT
PAD DEFIB RADIO PHYSIO CONN (PAD) ×1 IMPLANT
PATCH CARTO3 (PAD) IMPLANT
SHEATH CARTO VIZIGO SM CVD (SHEATH) IMPLANT
SHEATH PINNACLE 7F 10CM (SHEATH) IMPLANT
SHEATH PINNACLE 8F 10CM (SHEATH) IMPLANT
SHEATH PROBE COVER 6X72 (BAG) IMPLANT
TUBING SMART ABLATE COOLFLOW (TUBING) IMPLANT

## 2022-06-28 NOTE — Transfer of Care (Signed)
Immediate Anesthesia Transfer of Care Note  Patient: Lauren [name]  Procedure(s) Performed: SVT ABLATION  Patient Location: Cath Lab  Anesthesia Type:MAC  Level of Consciousness: awake, alert  and oriented  Airway & Oxygen Therapy: Patient Spontanous Breathing  Post-op Assessment: Report given to RN and Post -op Vital signs reviewed and stable  Post vital signs: Reviewed and stable  Last Vitals:  Vitals Value Taken Time  BP 163/85 06/28/22 1643  Temp    Pulse 84 06/28/22 1643  Resp 23 06/28/22 1643  SpO2 95 % 06/28/22 1643  Vitals shown include unvalidated device data.  Last Pain:  Vitals:   06/28/22 1250  TempSrc:   PainSc: 0-No pain         Complications: There were no known notable events for this encounter.

## 2022-06-28 NOTE — Discharge Instructions (Signed)

## 2022-06-28 NOTE — Progress Notes (Signed)
Pt with cont'd c/o chest tightness-states she did speak with Dr Quentin Ore after procedure and was made aware c/o WNL after procedure-Dr  Quentin Ore notified of c/o-orders for tylenol 1gm and that pt may be d/c at 2100

## 2022-06-28 NOTE — H&P (Signed)
Electrophysiology Office Note:     Date:  06/28/2022     ID:  Lauren [name], DOB 12-22-1965, MRN [id number]   PCP:  System, Provider Not In       [facility] Cardiologist:  None  [facility] Electrophysiologist:  Vickie Epley, MD    Referring MD: Geralynn Rile, *    Chief Complaint: SVT   History of Present Illness:     Lauren [name] is a 56 y.o. female who presents for an evaluation of SVT at the request of Dr Audie Box. Their medical history includes HTN, SVT ,GERD. She last saw Dr Audie Box 04/04/2022. She was previously seen in the ER on 03/21/2022 and required IV adenosine to convert to normal rhythm. She was presyncopal during the episode and felt palpitations.    Today, she reports that her initial episode of SVT was last year on her birthday. There were no recurring episodes until recently which prompted her ER visit. On 03/21/22 she was feeling lightheaded after waking up, and "just didn't feel right." She was also having difficulty breathing. She confirms feeling better after receiving IV adenosine.    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   At 56 yo she was started on a CPAP. Currently she is not using this as she is unable to afford the CPAP at this time.   In her family, her younger sister had a heart attack. Her father had a CABG x3. Her mother also had heart issues.   Every time she lies down, she subsequently develops coughing spells. She is following with GI and imaging is planned for the 15th.   They deny any chest pain, shortness of breath, or peripheral edema. No lightheadedness, headaches, syncope, orthopnea, or PND.       Objective      Past Medical History:  Diagnosis Date   Anxiety     Arthritis     Depression     GERD (gastroesophageal reflux disease)     Hypertension     Prediabetes     Sleep apnea      Past hx - no cpap now    SVT (supraventricular tachycardia) ([place])              Past Surgical History:  Procedure Laterality Date   ABDOMINAL HYSTERECTOMY       BACK SURGERY       CESAREAN SECTION       COLONOSCOPY       HAND SURGERY       NECK SURGERY   2002    plate in her neck      Current Medications: Active Medications      Current Meds  Medication Sig   albuterol (PROAIR HFA) 108 (90 Base) MCG/ACT inhaler Inhale 1-2 puffs into the lungs every 6 (six) hours as needed for wheezing or shortness of breath.   aspirin EC 81 MG tablet Take 81 mg by mouth daily. Swallow whole.   atorvastatin (LIPITOR) 10 MG tablet TAKE 1 TABLET (10 MG TOTAL) BY MOUTH DAILY.   benzonatate (TESSALON) 100 MG capsule Take 1 capsule (100 mg total) by mouth every 8 (eight) hours.   busPIRone (BUSPAR) 15 MG tablet Take 15 mg by mouth 2 (two) times daily.   famotidine (PEPCID) 20 MG tablet Take 1 tablet (20 mg total) by mouth at bedtime.   fluticasone (FLONASE) 50 MCG/ACT nasal spray Place 2 sprays into both  nostrils daily.   ibuprofen (ADVIL) 600 MG tablet Take 1 tablet (600 mg total) by mouth every 8 (eight) hours as needed.   lamoTRIgine (LAMICTAL) 25 MG tablet Take 25 mg by mouth daily.   lisinopril (ZESTRIL) 20 MG tablet TAKE 1 TABLET (20 MG TOTAL) BY MOUTH DAILY.   Misc. Devices MISC Please provide patient with insurance approved supplies for her CPAP: tubing, chin strap and supplies and filters.   Multiple Vitamin (MULTIVITAMIN) capsule Take by mouth.   omeprazole (PRILOSEC) 40 MG capsule Take 1 capsule (40 mg total) by mouth 2 (two) times daily before a meal.   predniSONE (STERAPRED UNI-PAK 21 TAB) 10 MG (21) TBPK tablet Take as directed   traZODone (DESYREL) 100 MG tablet Take 0.5-1 tablets (50-100 mg total) by mouth at bedtime.   venlafaxine XR (EFFEXOR-XR) 150 MG 24 hr capsule Take 1 capsule (150 mg total) by mouth every morning.        Allergies:   Iodinated contrast media    Social History         Socioeconomic History   Marital status: Divorced       Spouse name: Not on file   Number of children: 1   Years of education: Not on file   Highest education level: Not on file  Occupational History   Occupation: Barrister's clerk - Furniture conservator/restorer  Tobacco Use   Smoking status: Every Day      Packs/day: 0.50      Years: 20.00      Total pack years: 10.00      Types: Cigarettes      Passive exposure: Current   Smokeless tobacco: Never   Tobacco comments:      0.5 or less   Vaping Use   Vaping Use: Never used  Substance and Sexual Activity   Alcohol use: No   Drug use: No   Sexual activity: Not Currently      Birth control/protection: None  Other Topics Concern   Not on file  Social History Narrative   Not on file    Social Determinants of Health    Financial Resource Strain: Not on file  Food Insecurity: Not on file  Transportation Needs: Not on file  Physical Activity: Not on file  Stress: Not on file  Social Connections: Not on file      Family History: The patient's family history includes Diabetes in her father; Healthy in her mother; Heart attack in her father and sister. There is no history of Colon cancer, Colon polyps, Esophageal cancer, Rectal cancer, or Stomach cancer.   ROS:   Please see the history of present illness.    All other systems reviewed and are negative.   EKGs/Labs/Other Studies Reviewed:     The following studies were reviewed today:   04/26/2022 CTA Coronary IMPRESSION: 1. Coronary calcium score of 1.1. This was 78th percentile for age-, sex, and race-matched controls. Plaque volume 18 mm3.   2. Normal coronary origin with right dominance.   3. Minimal calcified plaque (<25%) in the mid LAD.   4. Minimal non-calcified plaque (<25%) in the LCX.     04/08/2022 echo EF 65 RV normal Trivial MR   03/25/2022 EMS run sheet      EKG:   EKG is personally reviewed.  05/27/2022:  EKG was not ordered.       Recent Labs: 03/21/2022: Magnesium 1.9 03/31/2022: BUN 22; Creatinine, Ser 0.65;  Hemoglobin 12.5; Platelets 391; Potassium 3.6; Sodium 137 04/04/2022:  TSH 2.720    Recent Lipid Panel Labs (Brief)          Component Value Date/Time    CHOL 177 10/31/2020 1455    TRIG 76 10/31/2020 1455    HDL 57 10/31/2020 1455    CHOLHDL 3.1 10/31/2020 1455    LDLCALC 106 (H) 10/31/2020 1455        Physical Exam:     VS:  BP 152/86   Pulse 82   Ht '5\' 2"'$  (1.575 m)   Wt 170 lb 3.2 oz (77.2 kg)   SpO2 97%   BMI 31.13 kg/m         Wt Readings from Last 3 Encounters:  05/27/22 170 lb 3.2 oz (77.2 kg)  04/04/22 169 lb 12.8 oz (77 kg)  04/02/22 168 lb (76.2 kg)      GEN: Well nourished, well developed in no acute distress HEENT: Normal NECK: No JVD; No carotid bruits LYMPHATICS: No lymphadenopathy CARDIAC: RRR, no murmurs, rubs, gallops RESPIRATORY:  Clear to auscultation without rales, wheezing or rhonchi  ABDOMEN: Soft, non-tender, non-distended MUSCULOSKELETAL:  No edema; No deformity  SKIN: Warm and dry NEUROLOGIC:  Alert and oriented x 3 PSYCHIATRIC:  Normal affect          Assessment ASSESSMENT:     1. SVT (supraventricular tachycardia) ([place])   2. Tobacco abuse   3. Primary hypertension     PLAN:     In order of problems listed above:   #SVT Recurrent and symptomatic. ECG appearance most consistent with AVNRT although I cannot completely exclude other mechanisms for SVT.   Therapeutic strategies for supraventricular tachycardia including medicine and ablation were discussed in detail with the patient today. Risk, benefits, and alternatives to EP study and radiofrequency ablation were also discussed in detail today. These risks include but are not limited to stroke, bleeding, vascular damage, tamponade, perforation, damage to the heart and other structures, AV block requiring pacemaker, worsening renal function, and death. The patient understands these risk and wishes to proceed.  We will therefore proceed with catheter ablation at the next available  time.   Presents today for EP study and possible ablation.     Signed, Hilton Cork. Quentin Ore, MD, [facility], [facility] 06/28/2022 Electrophysiology [facility]

## 2022-06-28 NOTE — Progress Notes (Signed)
Dr. Quentin Ore made aware of c/o chest tightness. At bedside now talking w/patient.

## 2022-06-28 NOTE — Anesthesia Procedure Notes (Signed)
Procedure Name: MAC Date/Time: 06/28/2022 2:26 PM  Performed by: Carolan Clines, CRNAPre-anesthesia Checklist: Patient identified, Emergency Drugs available, Suction available and Patient being monitored Patient Re-evaluated:Patient Re-evaluated prior to induction Oxygen Delivery Method: Simple face mask Dental Injury: Teeth and Oropharynx as per pre-operative assessment

## 2022-06-29 ENCOUNTER — Other Ambulatory Visit: Payer: Self-pay | Admitting: Gastroenterology

## 2022-06-30 NOTE — Anesthesia Postprocedure Evaluation (Signed)
Anesthesia Post Note  Patient: Lauren [name]  Procedure(s) Performed: SVT ABLATION     Patient location during evaluation: Cath Lab Anesthesia Type: MAC Level of consciousness: awake and alert Pain management: pain level controlled Vital Signs Assessment: post-procedure vital signs reviewed and stable Respiratory status: spontaneous breathing, nonlabored ventilation, respiratory function stable and patient connected to nasal cannula oxygen Cardiovascular status: stable and blood pressure returned to baseline Postop Assessment: no apparent nausea or vomiting Anesthetic complications: no   There were no known notable events for this encounter.  Last Vitals:  Vitals:   06/28/22 2015 06/28/22 2030  BP: (!) 160/91 (!) 149/81  Pulse: 74 71  Resp: 19 17  Temp:    SpO2: 96% 94%    Last Pain:  Vitals:   06/28/22 1718  TempSrc: Temporal  PainSc:                  Santa Lighter

## 2022-07-01 ENCOUNTER — Encounter (HOSPITAL_COMMUNITY): Payer: Self-pay | Admitting: Cardiology

## 2022-07-01 ENCOUNTER — Telehealth: Payer: Self-pay | Admitting: Cardiology

## 2022-07-01 MED FILL — Metoprolol Tartrate IV Soln 5 MG/5ML: INTRAVENOUS | Qty: 5 | Status: AC

## 2022-07-01 MED FILL — Heparin Sodium (Porcine) Inj 1000 Unit/ML: INTRAMUSCULAR | Qty: 10 | Status: AC

## 2022-07-01 NOTE — Telephone Encounter (Signed)
Small dime size hard area at incision site. Advised this was completely normal post procedure and it could take a couple of months for the spot to go away. Gave ED precautions.  Verbalized understanding.

## 2022-07-01 NOTE — Telephone Encounter (Signed)
Patient states she had an ablation Friday and today the incision site feels hard.

## 2022-07-08 NOTE — Progress Notes (Unsigned)
Cardiology Office Note:   Date:  07/11/2022  NAME:  Lauren [name]    MRN: [id number] DOB:  03/14/1966   PCP:  System, Provider Not In  Cardiologist:  None  Electrophysiologist:  Vickie Epley, MD   Referring MD: No ref. provider found   Chief Complaint  Patient presents with   Follow-up   History of Present Illness:   Lauren [name] is a 56 y.o. female with a hx of SVT status post ablation who presents for follow-up.  She underwent SVT ablation with Dr. Quentin Ore.  Doing well.  Reports no chest pains or trouble breathing.  She is still smoking roughly half pack per day.  Working on quitting.  She is on aspirin.  She is on Lipitor 10 mg daily.  We need to recheck her lipids.  She is getting 12,000 steps per day at work.  She works as a Music therapist.  Overall doing well.  She does wake up with cough at night.  Suspect this is sleep apnea related.  Awaiting for new sleep study.  Suspect this will need to be titrated.  Blood pressure is well controlled.  We will continue with aggressive risk factor modification for nonobstructive CAD.  Materials engineer at 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 Tobacco abuse -1/2 ppd  Past Medical History: Past Medical History:  Diagnosis Date   Anxiety    Arthritis    Depression    GERD (gastroesophageal reflux disease)    Hypertension    Prediabetes    Sleep apnea    Past hx - no cpap now    SVT (supraventricular tachycardia) ([place])     Past Surgical History: Past Surgical History:  Procedure Laterality Date   ABDOMINAL HYSTERECTOMY     BACK SURGERY     CESAREAN SECTION     COLONOSCOPY     HAND SURGERY     NECK SURGERY  2002   plate in her neck   SVT ABLATION N/A 06/28/2022   Procedure: SVT ABLATION;  Surgeon: Vickie Epley, MD;  Location: [facility];   Service: Cardiovascular;  Laterality: N/A;    Current Medications: Current Meds  Medication Sig   albuterol (PROAIR HFA) 108 (90 Base) MCG/ACT inhaler Inhale 1-2 puffs into the lungs every 6 (six) hours as needed for wheezing or shortness of breath.   aspirin EC 81 MG tablet Take 81 mg by mouth daily. Swallow whole.   atorvastatin (LIPITOR) 10 MG tablet TAKE 1 TABLET (10 MG TOTAL) BY MOUTH DAILY.   busPIRone (BUSPAR) 15 MG tablet Take 15 mg by mouth 2 (two) times daily.   famotidine (PEPCID) 20 MG tablet TAKE 1 TABLET BY MOUTH EVERYDAY AT BEDTIME   lamoTRIgine (LAMICTAL) 25 MG tablet Take 25 mg by mouth 2 (two) times daily.   lisinopril (ZESTRIL) 20 MG tablet TAKE 1 TABLET (20 MG TOTAL) BY MOUTH DAILY.   metoprolol tartrate (LOPRESSOR) 25 MG tablet Take 1 tablet (25 mg total) by mouth 2 (two) times daily.   Misc. Devices MISC Please provide patient with insurance approved supplies for her CPAP: tubing, chin strap and supplies and filters.   Multiple Vitamin (MULTIVITAMIN) capsule Take 1 capsule by mouth daily.   omeprazole (PRILOSEC) 40 MG capsule Take 1 capsule (40 mg total) by mouth 2 (two) times daily before  a meal.   traZODone (DESYREL) 100 MG tablet Take 0.5-1 tablets (50-100 mg total) by mouth at bedtime. (Patient taking differently: Take 100 mg by mouth at bedtime.)   venlafaxine XR (EFFEXOR-XR) 150 MG 24 hr capsule Take 1 capsule (150 mg total) by mouth every morning.     Allergies:    Iodinated contrast media   Social History: Social History   Socioeconomic History   Marital status: Divorced    Spouse name: Not on file   Number of children: 1   Years of education: Not on file   Highest education level: Not on file  Occupational History   Occupation: Barrister's clerk - Furniture conservator/restorer  Tobacco Use   Smoking status: Every Day    Packs/day: 0.50    Years: 20.00    Total pack years: 10.00    Types: Cigarettes    Passive exposure: Current   Smokeless tobacco: Never    Tobacco comments:    Smokes about 9 ciggs daily 06/21/22 LW  Vaping Use   Vaping Use: Never used  Substance and Sexual Activity   Alcohol use: No   Drug use: No   Sexual activity: Not Currently    Birth control/protection: None  Other Topics Concern   Not on file  Social History Narrative   Not on file   Social Determinants of Health   Financial Resource Strain: Not on file  Food Insecurity: Not on file  Transportation Needs: Not on file  Physical Activity: Not on file  Stress: Not on file  Social Connections: Not on file     Family History: The patient's family history includes Diabetes in her father; Healthy in her mother; Heart attack in her father and sister. There is no history of Colon cancer, Colon polyps, Esophageal cancer, Rectal cancer, or Stomach cancer.  ROS:   All other ROS reviewed and negative. Pertinent positives noted in the HPI.     EKGs/Labs/Other Studies Reviewed:   The following studies were personally reviewed by me today:   Recent Labs: 03/21/2022: Magnesium 1.9 04/04/2022: TSH 2.720 06/03/2022: BUN 7; Creatinine, Ser 0.67; Hemoglobin 11.6; Platelets 397; Potassium 3.7; Sodium 142   Recent Lipid Panel    Component Value Date/Time   CHOL 177 10/31/2020 1455   TRIG 76 10/31/2020 1455   HDL 57 10/31/2020 1455   CHOLHDL 3.1 10/31/2020 1455   LDLCALC 106 (H) 10/31/2020 1455    Physical Exam:   VS:  BP 122/68   Pulse 75   Ht '5\' 2"'$  (1.575 m)   Wt 172 lb (78 kg)   SpO2 98%   BMI 31.46 kg/m    Wt Readings from Last 3 Encounters:  07/11/22 172 lb (78 kg)  07/10/22 171 lb 3.2 oz (77.7 kg)  06/28/22 170 lb (77.1 kg)    General: Well nourished, well developed, in no acute distress Head: Atraumatic, normal size  Eyes: PEERLA, EOMI  Neck: Supple, no JVD Endocrine: No thryomegaly Cardiac: Normal S1, S2; RRR; no murmurs, rubs, or gallops Lungs: Clear to auscultation bilaterally, no wheezing, rhonchi or rales  Abd: Soft, nontender, no hepatomegaly   Ext: No edema, pulses 2+ Musculoskeletal: No deformities, BUE and BLE strength normal and equal Skin: Warm and dry, no rashes   Neuro: Alert and oriented to person, place, time, and situation, CNII-XII grossly intact, no focal deficits  Psych: Normal mood and affect   ASSESSMENT:   Maddison Kilner is a 56 y.o. female who presents for the following: 1. SVT (  supraventricular tachycardia) ([place])   2. Agatston coronary artery calcium score less than 100   3. Mixed hyperlipidemia   4. Primary hypertension   5. Tobacco abuse     PLAN:   1. SVT (supraventricular tachycardia) ([facility]) -Status post SVT ablation.  Suspect this will be curative.  Continue to monitor for palpitations.  2. Agatston coronary artery calcium score less than 100 3. Mixed hyperlipidemia -Nonobstructive CAD.  Coronary calcium score above the 75th percentile.  On Lipitor 10 mg daily.  On aspirin.  Recheck lipids today.  Would aim for LDL cholesterol less than 70.  No symptoms concerning for angina.  4. Primary hypertension -Well-controlled.  No change in medications.  5. Tobacco abuse -3 minutes of smoking cessation counseling was provided.  The importance of smoking cessation was discussed in detail today.  Disposition: Return in about 1 year (around 07/12/2023).  Medication Adjustments/Labs and Tests Ordered: Current medicines are reviewed at length with the patient today.  Concerns regarding medicines are outlined above.  No orders of the defined types were placed in this encounter.  No orders of the defined types were placed in this encounter.   There are no Patient Instructions on file for this visit.   Time Spent with Patient: I have spent a total of 25 minutes with patient reviewing hospital notes, telemetry, EKGs, labs and examining the patient as well as establishing an assessment and plan that was discussed with the patient.  > 50% of time was spent in direct patient care.  Signed, Addison Naegeli. Audie Box,  MD, [place]  [address] [place], [place] [address] [phone]  07/11/2022 9:51 AM

## 2022-07-10 ENCOUNTER — Ambulatory Visit (INDEPENDENT_AMBULATORY_CARE_PROVIDER_SITE_OTHER): Payer: Managed Care, Other (non HMO) | Admitting: Nurse Practitioner

## 2022-07-10 ENCOUNTER — Encounter: Payer: Self-pay | Admitting: Nurse Practitioner

## 2022-07-10 VITALS — BP 120/60 | HR 56 | Wt 171.2 lb

## 2022-07-10 DIAGNOSIS — R059 Cough, unspecified: Secondary | ICD-10-CM | POA: Diagnosis not present

## 2022-07-10 NOTE — Progress Notes (Signed)
Chief Complaint:  follow up on cough. Had EGD.    Assessment &  Plan   #56 year old female undergoing evaluation for cough which occurs in the supine position.  Though this sounds like reflux, patient tells me that it is mainly phlegm that she is refluxing.  EGD was unrevealing.  Continue current regimen. Will discuss with Dr. Tarri Glenn but 24-hour pH study could help determine whether or not her symptoms are reflux related.  However, I am not sure how it would change management as she is already on high-dose PPI plus famotidine and following antireflux measures   #History of colon polyps, surveillance colonoscopy due 2025  HPI   Lauren [name] is a 56 y.o. female known to Dr. Tarri Glenn with a past medical history significant for HLD , hypertension , tobacco abuse , prediabetes , depression , anxiety , OSA currently without CPAP , SVT.  See PMH /PSH for additional history   04/02/22 office visit with Dr. Tarri Glenn EGD scheduled for evaluation of cough.   Interval History:  Here for follow up after EGD which was done for evaluation of cough. EGD was basically normal. Coughing worse at night when laying down. She gets reflux of phlegm when lying flat.  She is unable to raise HOB. Has a wedge pillow but it gets displaced during the night. Goes to bed on an empty stomach. Taking Omeprazole BID and Famotine at night.  She is asking for help expediting acquisition of her CPAP machine.  She is supposed to be having a sleep study sometime in the next several weeks.    Previous GI Evaluation  06/10/22 EGD - Normal esophagus. Biopsied. - Normal stomach. Biopsied. - A single gastric submucosal nodule. Biopsied. - Duodenitis. Biopsied. - The examination was otherwise normal.  1. Surgical [P], duodenal biopsy - DUODENAL MUCOSA WITH NO SPECIFIC HISTOPATHOLOGIC CHANGES - NEGATIVE FOR INCREASED INTRAEPITHELIAL LYMPHOCYTES OR VILLOUS ARCHITECTURAL CHANGES 2. Surgical [P], fundus, gastric antrum and  gastric body - GASTRIC ANTRAL AND OXYNTIC MUCOSA WITH MILD NONSPECIFIC REACTIVE GASTROPATHY - HELICOBACTER PYLORI-LIKE ORGANISMS ARE NOT IDENTIFIED ON ROUTINE H&E STAIN 3. Surgical [P], gastric nodule - DIMINUTIVE GASTRIC HYPERPLASTIC POLYP - NEGATIVE FOR INTESTINAL METAPLASIA OR DYSPLASIA 4. Surgical [P], distal esophagus - ESOPHAGEAL SQUAMOUS MUCOSA WITH NO SPECIFIC HISTOPATHOLOGIC CHANGES - NEGATIVE FOR INCREASED INTRAEPITHELIAL EOSINOPHILS 5. Surgical [P], mid/proximal esophagus - ESOPHAGEAL SQUAMOUS MUCOSA WITH NO SPECIFIC HISTOPATHOLOGIC CHANGES - NEGATIVE FOR INCREASED INTRAEPITHELIAL EOSINOPHILS  Labs:     Latest Ref Rng & Units 06/03/2022    1:27 PM 03/31/2022    6:14 PM 03/21/2022   10:15 AM  CBC  WBC 3.4 - 10.8 x10E3/uL 8.8  9.1  5.8   Hemoglobin 11.1 - 15.9 g/dL 11.6  12.5  12.6   Hematocrit 34.0 - 46.6 % 34.6  38.5  41.0   Platelets 150 - 450 x10E3/uL 397  391  415        Latest Ref Rng & Units 02/11/2021    5:32 PM 10/31/2020    2:55 PM 03/15/2019   12:06 PM  Hepatic Function  Total Protein 6.5 - 8.1 g/dL 6.4  7.0  6.9   Albumin 3.5 - 5.0 g/dL 3.5  4.3  4.0   AST 15 - 41 U/L _0 ALT 0 - 44 U/L _1 Alk Phosphatase 38 - 126 U/L 60  79  70   Total Bilirubin 0.3 - 1.2 mg/dL 0.6  <0.2  0.4      Past Medical History:  Diagnosis Date   Anxiety    Arthritis    Depression    GERD (gastroesophageal reflux disease)    Hypertension    Prediabetes    Sleep apnea    Past hx - no cpap now    SVT (supraventricular tachycardia) ([place])     Past Surgical History:  Procedure Laterality Date   ABDOMINAL HYSTERECTOMY     BACK SURGERY     CESAREAN SECTION     COLONOSCOPY     HAND SURGERY     NECK SURGERY  2002   plate in her neck   SVT ABLATION N/A 06/28/2022   Procedure: SVT ABLATION;  Surgeon: Vickie Epley, MD;  Location: [facility];  Service: Cardiovascular;  Laterality: N/A;    Current Medications, Allergies, Family History and  Social History were reviewed in [employer] record.     Current Outpatient Medications  Medication Sig Dispense Refill   albuterol (PROAIR HFA) 108 (90 Base) MCG/ACT inhaler Inhale 1-2 puffs into the lungs every 6 (six) hours as needed for wheezing or shortness of breath. 8.5 g 2   aspirin EC 81 MG tablet Take 81 mg by mouth daily. Swallow whole.     atorvastatin (LIPITOR) 10 MG tablet TAKE 1 TABLET (10 MG TOTAL) BY MOUTH DAILY. 30 tablet 1   busPIRone (BUSPAR) 15 MG tablet Take 15 mg by mouth 2 (two) times daily.     famotidine (PEPCID) 20 MG tablet TAKE 1 TABLET BY MOUTH EVERYDAY AT BEDTIME 30 tablet 3   lamoTRIgine (LAMICTAL) 25 MG tablet Take 25 mg by mouth 2 (two) times daily.     lisinopril (ZESTRIL) 20 MG tablet TAKE 1 TABLET (20 MG TOTAL) BY MOUTH DAILY. 30 tablet 1   Misc. Devices MISC Please provide patient with insurance approved supplies for her CPAP: tubing, chin strap and supplies and filters. 1 each 0   Multiple Vitamin (MULTIVITAMIN) capsule Take 1 capsule by mouth daily.     omeprazole (PRILOSEC) 40 MG capsule Take 1 capsule (40 mg total) by mouth 2 (two) times daily before a meal. 180 capsule 3   traZODone (DESYREL) 100 MG tablet Take 0.5-1 tablets (50-100 mg total) by mouth at bedtime. (Patient taking differently: Take 100 mg by mouth at bedtime.) 90 tablet 1   venlafaxine XR (EFFEXOR-XR) 150 MG 24 hr capsule Take 1 capsule (150 mg total) by mouth every morning. 30 capsule 1   metoprolol tartrate (LOPRESSOR) 25 MG tablet Take 1 tablet (25 mg total) by mouth 2 (two) times daily. 60 tablet 0   No current facility-administered medications for this visit.    Review of Systems: No chest pain. No shortness of breath. No urinary complaints.    Physical Exam  Wt Readings from Last 3 Encounters:  07/10/22 171 lb 3.2 oz (77.7 kg)  06/28/22 170 lb (77.1 kg)  06/21/22 169 lb 6.4 oz (76.8 kg)    BP 120/60   Pulse (!) 56   Wt 171 lb 3.2 oz (77.7 kg)    SpO2 97%   BMI 31.31 kg/m  Constitutional:  Generally well appearing female in no acute distress. Psychiatric: Pleasant. Normal mood and affect. Behavior is normal. EENT: Pupils normal.  Conjunctivae are normal. No scleral icterus. Neck supple.  Cardiovascular: Normal rate, regular rhythm. No edema Pulmonary/chest: Effort normal and breath sounds normal. No wheezing, rales or rhonchi. Abdominal: Soft, nondistended, nontender. Bowel sounds active throughout.  There are no masses palpable. No hepatomegaly. Neurological: Alert and oriented to person place and time. Skin: Skin is warm and dry. No rashes noted.  Tye Savoy, NP  07/10/2022, 12:08 PM

## 2022-07-10 NOTE — Patient Instructions (Signed)
Follow up as needed.   The  GI providers would like to encourage you to use MYCHART to communicate with providers for non-urgent requests or questions.  Due to long hold times on the telephone, sending your provider a message by MYCHART may be a faster and more efficient way to get a response.  Please allow 48 business hours for a response.  Please remember that this is for non-urgent requests.   

## 2022-07-11 ENCOUNTER — Ambulatory Visit: Payer: Managed Care, Other (non HMO) | Attending: Cardiovascular Disease | Admitting: Cardiovascular Disease

## 2022-07-11 ENCOUNTER — Encounter: Payer: Self-pay | Admitting: Cardiovascular Disease

## 2022-07-11 VITALS — BP 122/68 | HR 75 | Ht 62.0 in | Wt 172.0 lb

## 2022-07-11 DIAGNOSIS — R931 Abnormal findings on diagnostic imaging of heart and coronary circulation: Secondary | ICD-10-CM

## 2022-07-11 DIAGNOSIS — F17210 Nicotine dependence, cigarettes, uncomplicated: Secondary | ICD-10-CM | POA: Diagnosis not present

## 2022-07-11 DIAGNOSIS — Z720 Tobacco use: Secondary | ICD-10-CM

## 2022-07-11 DIAGNOSIS — I471 Supraventricular tachycardia: Secondary | ICD-10-CM | POA: Diagnosis not present

## 2022-07-11 DIAGNOSIS — E782 Mixed hyperlipidemia: Secondary | ICD-10-CM | POA: Diagnosis not present

## 2022-07-11 DIAGNOSIS — I10 Essential (primary) hypertension: Secondary | ICD-10-CM | POA: Diagnosis not present

## 2022-07-11 LAB — LIPID PANEL
Chol/HDL Ratio: 3.5 ratio (ref 0.0–4.4)
Cholesterol, Total: 151 mg/dL (ref 100–199)
HDL: 43 mg/dL (ref >39)
LDL Chol Calc (NIH): 91 mg/dL (ref 0–99)
Triglycerides: 91 mg/dL (ref 0–149)
VLDL Cholesterol Cal: 17 mg/dL (ref 5–40)

## 2022-07-11 NOTE — Patient Instructions (Signed)
Medication Instructions:  The current medical regimen is effective;  continue present plan and medications.  *If you need a refill on your cardiac medications before your next appointment, please call your pharmacy*   Lab Work: LIPID today   If you have labs (blood work) drawn today and your tests are completely normal, you will receive your results only by: [place] (if you have MyChart) OR A paper copy in the mail If you have any lab test that is abnormal or we need to change your treatment, we will call you to review the results.   Follow-Up: At [facility], you and your health needs are our priority.  As part of our continuing mission to provide you with exceptional heart care, we have created designated Provider Care Teams.  These Care Teams include your primary Cardiologist (physician) and Advanced Practice Providers (APPs -  Physician Assistants and Nurse Practitioners) who all work together to provide you with the care you need, when you need it.  We recommend signing up for the patient portal called "MyChart".  Sign up information is provided on this After Visit Summary.  MyChart is used to connect with patients for Virtual Visits (Telemedicine).  Patients are able to view lab/test results, encounter notes, upcoming appointments, etc.  Non-urgent messages can be sent to your provider as well.   To learn more about what you can do with MyChart, go to [email or web address].    Your next appointment:   12 month(s)  The format for your next appointment:   In Person  Provider:   Eleonore Chiquito, MD

## 2022-07-15 ENCOUNTER — Other Ambulatory Visit: Payer: Self-pay

## 2022-07-15 DIAGNOSIS — E785 Hyperlipidemia, unspecified: Secondary | ICD-10-CM

## 2022-07-15 MED ORDER — ATORVASTATIN CALCIUM 20 MG PO TABS: 20.0000 mg | ORAL_TABLET | Freq: Every day | ORAL | 3 refills | Status: DC

## 2022-08-14 ENCOUNTER — Ambulatory Visit: Payer: Managed Care, Other (non HMO) | Admitting: Cardiology

## 2022-08-16 ENCOUNTER — Ambulatory Visit: Payer: Managed Care, Other (non HMO) | Admitting: Nurse Practitioner

## 2022-08-16 ENCOUNTER — Other Ambulatory Visit: Payer: Self-pay

## 2022-08-16 ENCOUNTER — Encounter (HOSPITAL_BASED_OUTPATIENT_CLINIC_OR_DEPARTMENT_OTHER): Payer: Self-pay | Admitting: Emergency Medicine

## 2022-08-16 ENCOUNTER — Emergency Department (HOSPITAL_BASED_OUTPATIENT_CLINIC_OR_DEPARTMENT_OTHER)
Admission: EM | Admit: 2022-08-16 | Discharge: 2022-08-16 | Disposition: A | Discharge status: Home / Self Care | Payer: Managed Care, Other (non HMO) | Attending: Emergency Medicine | Admitting: Emergency Medicine

## 2022-08-16 ENCOUNTER — Emergency Department (HOSPITAL_BASED_OUTPATIENT_CLINIC_OR_DEPARTMENT_OTHER): Payer: Managed Care, Other (non HMO) | Admitting: Radiology

## 2022-08-16 DIAGNOSIS — M5416 Radiculopathy, lumbar region: Secondary | ICD-10-CM | POA: Diagnosis not present

## 2022-08-16 DIAGNOSIS — Z7982 Long term (current) use of aspirin: Secondary | ICD-10-CM | POA: Insufficient documentation

## 2022-08-16 DIAGNOSIS — I10 Essential (primary) hypertension: Secondary | ICD-10-CM | POA: Insufficient documentation

## 2022-08-16 DIAGNOSIS — M549 Dorsalgia, unspecified: Secondary | ICD-10-CM | POA: Diagnosis present

## 2022-08-16 DIAGNOSIS — Z79899 Other long term (current) drug therapy: Secondary | ICD-10-CM | POA: Insufficient documentation

## 2022-08-16 LAB — CBC
HCT: 36 % (ref 36.0–46.0)
Hemoglobin: 11.4 g/dL — ABNORMAL LOW (ref 12.0–15.0)
MCH: 28 pg (ref 26.0–34.0)
MCHC: 31.7 g/dL (ref 30.0–36.0)
MCV: 88.5 fL (ref 80.0–100.0)
Platelets: 369 10*3/uL (ref 150–400)
RBC: 4.07 MIL/uL (ref 3.87–5.11)
RDW: 14.8 % (ref 11.5–15.5)
WBC: 7.6 10*3/uL (ref 4.0–10.5)
nRBC: 0 % (ref 0.0–0.2)

## 2022-08-16 LAB — BASIC METABOLIC PANEL
Anion gap: 8 (ref 5–15)
BUN: 12 mg/dL (ref 6–20)
CO2: 23 mmol/L (ref 22–32)
Calcium: 9.2 mg/dL (ref 8.9–10.3)
Chloride: 108 mmol/L (ref 98–111)
Creatinine, Ser: 0.73 mg/dL (ref 0.44–1.00)
GFR, Estimated: >60 mL/min (ref >60)
Glucose, Bld: 111 mg/dL — ABNORMAL HIGH (ref 70–99)
Potassium: 3.8 mmol/L (ref 3.5–5.1)
Sodium: 139 mmol/L (ref 135–145)

## 2022-08-16 LAB — TROPONIN I (HIGH SENSITIVITY)
Troponin I (High Sensitivity): <2 ng/L (ref <18)
Troponin I (High Sensitivity): <2 ng/L (ref <18)

## 2022-08-16 MED ORDER — LIDOCAINE 5 % EX PTCH: 1.0000 | MEDICATED_PATCH | CUTANEOUS | 0 refills | Status: DC

## 2022-08-16 NOTE — ED Provider Notes (Signed)
[facility] EMERGENCY DEPT Provider Note   CSN: [id number] Arrival date & time: 08/16/22  1420     History {Add pertinent medical, surgical, social history, OB history to HPI:1} Chief Complaint  Patient presents with   Leg Pain    Lauren [name] is a 56 y.o. female.  RLE pain x3 days cramping worse with sitting feels like sciatica Does have back pain No urinary or bowel incont No numbn/weak Palpitations but no SOB Only nocturnal cough  No cp  hx of svt, has been taking ibu, lifting heavy things at work and feel it may be relate no hx of DVT/PE or cancer steroid injections back pain no swelling       Home Medications Prior to Admission medications   Medication Sig Start Date End Date Taking? Authorizing Provider  albuterol (PROAIR HFA) 108 (90 Base) MCG/ACT inhaler Inhale 1-2 puffs into the lungs every 6 (six) hours as needed for wheezing or shortness of breath. 12/17/21   Charlott Rakes, MD  aspirin EC 81 MG tablet Take 81 mg by mouth daily. Swallow whole.    [provider]  atorvastatin (LIPITOR) 20 MG tablet Take 1 tablet (20 mg total) by mouth daily. 07/15/22 07/10/23  O'NealCassie Freer, MD  busPIRone (BUSPAR) 15 MG tablet Take 15 mg by mouth 2 (two) times daily. 05/08/22   [provider]  famotidine (PEPCID) 20 MG tablet TAKE 1 TABLET BY MOUTH EVERYDAY AT BEDTIME 07/01/22   Thornton Park, MD  lamoTRIgine (LAMICTAL) 25 MG tablet Take 25 mg by mouth 2 (two) times daily. 05/18/22   [provider]  lisinopril (ZESTRIL) 20 MG tablet TAKE 1 TABLET (20 MG TOTAL) BY MOUTH DAILY. 02/01/22 02/01/23  Charlott Rakes, MD  metoprolol tartrate (LOPRESSOR) 25 MG tablet Take 1 tablet (25 mg total) by mouth 2 (two) times daily. 03/21/22 07/11/22  Tacy Learn, PA-C  Misc. Devices MISC Please provide patient with insurance approved supplies for her CPAP: tubing, chin strap and supplies and filters. 11/17/18   Gildardo Pounds, NP   Multiple Vitamin (MULTIVITAMIN) capsule Take 1 capsule by mouth daily.    [provider]  omeprazole (PRILOSEC) 40 MG capsule Take 1 capsule (40 mg total) by mouth 2 (two) times daily before a meal. 04/02/22   Thornton Park, MD  traZODone (DESYREL) 100 MG tablet Take 0.5-1 tablets (50-100 mg total) by mouth at bedtime. Patient taking differently: Take 100 mg by mouth at bedtime. 01/08/22   Gildardo Pounds, NP  venlafaxine XR (EFFEXOR-XR) 150 MG 24 hr capsule Take 1 capsule (150 mg total) by mouth every morning. 02/01/22   Charlott Rakes, MD      Allergies    Iodinated contrast media    Review of Systems   Review of Systems  Physical Exam Updated Vital Signs BP 138/68   Pulse 63   Temp 97.6 F (36.4 C)   Resp 17   Ht '5\' 2"'$  (1.575 m)   Wt 77.1 kg   SpO2 99%   BMI 31.09 kg/m  Physical Exam  ED Results / Procedures / Treatments   Labs (all labs ordered are listed, but only abnormal results are displayed) Labs Reviewed  BASIC METABOLIC PANEL - Abnormal; Notable for the following components:      Result Value   Glucose, Bld 111 (*)    All other components within normal limits  CBC - Abnormal; Notable for the following components:   Hemoglobin 11.4 (*)    All other  components within normal limits  TROPONIN I (HIGH SENSITIVITY)  TROPONIN I (HIGH SENSITIVITY)    EKG EKG Interpretation  Date/Time:  Friday August 16 2022 14:49:02 EDT Ventricular Rate:  57 PR Interval:  128 QRS Duration: 76 QT Interval:  414 QTC Calculation: 402 R Axis:   56 Text Interpretation: Sinus bradycardia with marked sinus arrhythmia Otherwise normal ECG When compared with ECG of 16-Aug-2022 14:39, No significant change was found Confirmed by Margaretmary Eddy [phone]) on 08/16/2022 5:35:53 PM  Radiology DG Chest 2 View  Result Date: 08/16/2022 CLINICAL DATA:  Chest pain.  Started 3 days ago.  Worse today. EXAM: CHEST - 2 VIEW COMPARISON:  Chest two views 04/30/2022 FINDINGS:  Cardiac silhouette and mediastinal contours are within normal limits. Mild calcification within the aortic arch. The lungs are clear. No pleural effusion or pneumothorax. Mild dextrocurvature of the lower thoracic spine with mild multilevel degenerative disc changes, similar to prior. ACDF hardware again overlies the lower cervical spine. IMPRESSION: No active cardiopulmonary disease. Electronically Signed   By: Yvonne Kendall M.D.   On: 08/16/2022 15:02    Procedures Procedures  {Document cardiac monitor, telemetry assessment procedure when appropriate:1}  Medications Ordered in ED Medications - No data to display  ED Course/ Medical Decision Making/ A&P                           Medical Decision Making Amount and/or Complexity of Data Reviewed Labs: ordered. Radiology: ordered.  Risk Prescription drug management.   ***  {Document critical care time when appropriate:1} {Document review of labs and clinical decision tools ie heart score, Chads2Vasc2 etc:1}  {Document your independent review of radiology images, and any outside records:1} {Document your discussion with family members, caretakers, and with consultants:1} {Document social determinants of health affecting pt's care:1} {Document your decision making why or why not admission, treatments were needed:1} Final Clinical Impression(s) / ED Diagnoses Final diagnoses:  None    Rx / DC Orders ED Discharge Orders     None

## 2022-08-16 NOTE — ED Notes (Signed)
Repeat EKG completed and given to Dr. Billy Fischer.

## 2022-08-16 NOTE — ED Triage Notes (Signed)
Leg pain in back of right leg.  Reports increased pain and chest pain when sitting. Started 3 days ago worse today

## 2022-08-16 NOTE — Discharge Instructions (Addendum)
Today you were seen in the emergency department for your leg pain.    In the emergency department you had an evaluation that was reassuring.    At home, please take Tylenol, ibuprofen, and lidocaine patches as needed for your pain.    Check your MyChart online for the results of any tests that had not resulted by the time you left the emergency department.   Follow-up with your primary doctor in 2-3 days regarding your visit.  Follow-up with sports medicine as well to discuss your leg pain.  Return immediately to the emergency department if you experience any of the following: Calf swelling, difficulty breathing, chest pains, or any other concerning symptoms.    Thank you for visiting our Emergency Department. It was a pleasure taking care of you today.

## 2022-08-16 NOTE — ED Notes (Signed)
Discharge paperwork given and verbally understood. 

## 2022-08-16 NOTE — ED Notes (Signed)
3 days of pain to the back of right thigh. During increase in pain over the last day, "pressure" and loss of breath occur in her chest. Takes a couple of deep breaths, pressure resolves.

## 2022-08-22 ENCOUNTER — Ambulatory Visit: Payer: Managed Care, Other (non HMO) | Attending: Cardiology | Admitting: Physician Assistant

## 2022-08-22 ENCOUNTER — Encounter: Payer: Self-pay | Admitting: Physician Assistant

## 2022-08-22 VITALS — BP 112/72 | HR 69 | Ht 62.0 in | Wt 173.4 lb

## 2022-08-22 DIAGNOSIS — R002 Palpitations: Secondary | ICD-10-CM

## 2022-08-22 DIAGNOSIS — I471 Supraventricular tachycardia, unspecified: Secondary | ICD-10-CM

## 2022-08-22 DIAGNOSIS — I498 Other specified cardiac arrhythmias: Secondary | ICD-10-CM | POA: Diagnosis not present

## 2022-08-22 MED ORDER — METOPROLOL TARTRATE 25 MG PO TABS: 25.0000 mg | ORAL_TABLET | Freq: Two times a day (BID) | ORAL | 1 refills | Status: DC

## 2022-08-22 NOTE — Patient Instructions (Addendum)
Medication Instructions:   START TAKING :  METOPROLOL (LOPRESSOR  ) 25 MG  TWICE  A DAY   *If you need a refill on your cardiac medications before your next appointment, please call your pharmacy*   Lab Work: NONE ORDERED  TODAY    If you have labs (blood work) drawn today and your tests are completely normal, you will receive your results only by: [place] (if you have MyChart) OR A paper copy in the mail If you have any lab test that is abnormal or we need to change your treatment, we will call you to review the results.   Testing/Procedures: NONE ORDERED  TODAY      Follow-Up: At [facility], you and your health needs are our priority.  As part of our continuing mission to provide you with exceptional heart care, we have created designated Provider Care Teams.  These Care Teams include your primary Cardiologist (physician) and Advanced Practice Providers (APPs -  Physician Assistants and Nurse Practitioners) who all work together to provide you with the care you need, when you need it.  We recommend signing up for the patient portal called "MyChart".  Sign up information is provided on this After Visit Summary.  MyChart is used to connect with patients for Virtual Visits (Telemedicine).  Patients are able to view lab/test results, encounter notes, upcoming appointments, etc.  Non-urgent messages can be sent to your provider as well.   To learn more about what you can do with MyChart, go to [email or web address].    Your next appointment:   6 week(s) WITH EKG   The format for your next appointment:   In Person  Provider:   Tommye Standard, PA-C    Other Instructions   Important Information About Sugar

## 2022-08-22 NOTE — Progress Notes (Signed)
Cardiology Office Note Date:  08/22/2022  Patient ID:  [name], [name] 09/13/1966, MRN [id number] PCP:  System, Provider Not In  Electrophysiologist: Dr. Quentin Ore   Chief Complaint: palpitations  History of Present Illness: Lauren [name] is a 56 y.o. female with history of HTN, GERD, SVT (adenosine sensitive), sleeps apnea (untreated, reports unable to afford CPAP therapy), smoker, family CAD hx  Referred to Dr. Quentin Ore, saw him 05/27/22 after an ER visit for SVT, planned for ablation. EPS/ablation 06/28/22 Noted severely dilated CS and CS ostium with inferiorly displaced fast pathway fibers. Radiofrequency ablation of the slow pathway region with residual slow pathway conduction and inducible AVNRT. Planned to consider AAD if recurrent SVT  She saw Dr. Audie Box 07/11/22, wakes with a cough, suspected her sleep apnea, pending new testing.  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 No palpitations/SVTs  08/16/22, ER visit with back and LE pain, "feels like sciatica that she has had on her left lower extremity." Discharged with PMD follow up, Rx lidoderm.  TODAY She is doing well No CP, no procedure site concerns, no SOB. She is having palpitations, not her SVT. She has difficulty describing them, they are worrisome, but perhaps not particularly symptomatic. Not all day every day. She felt very well after her EP/ablation, was taking her lopressor as written '25mg'$  BID.  When she went for refill, thsy told her she was early, her PMD reported that she had been taking them wrong and advised to reduce to 12.'5mg'$  once daily. Since then the palpitations started.  No near syncope or syncope, no dizzy spells   Past Medical History:  Diagnosis Date   Anxiety    Arthritis    Depression    GERD (gastroesophageal reflux disease)    Hypertension    Prediabetes    Sleep apnea    Past hx - no cpap now    SVT (supraventricular tachycardia)     Past  Surgical History:  Procedure Laterality Date   ABDOMINAL HYSTERECTOMY     BACK SURGERY     CESAREAN SECTION     COLONOSCOPY     HAND SURGERY     NECK SURGERY  2002   plate in her neck   SVT ABLATION N/A 06/28/2022   Procedure: SVT ABLATION;  Surgeon: Vickie Epley, MD;  Location: [facility];  Service: Cardiovascular;  Laterality: N/A;    Current Outpatient Medications  Medication Sig Dispense Refill   albuterol (PROAIR HFA) 108 (90 Base) MCG/ACT inhaler Inhale 1-2 puffs into the lungs every 6 (six) hours as needed for wheezing or shortness of breath. 8.5 g 2   aspirin EC 81 MG tablet Take 81 mg by mouth daily. Swallow whole.     atorvastatin (LIPITOR) 20 MG tablet Take 1 tablet (20 mg total) by mouth daily. 90 tablet 3   busPIRone (BUSPAR) 15 MG tablet Take 15 mg by mouth 2 (two) times daily.     famotidine (PEPCID) 20 MG tablet TAKE 1 TABLET BY MOUTH EVERYDAY AT BEDTIME 30 tablet 3   lamoTRIgine (LAMICTAL) 25 MG tablet Take 25 mg by mouth 2 (two) times daily.     lidocaine (LIDODERM) 5 % Place 1 patch onto the skin daily. Remove & Discard patch within 12 hours or as directed by MD 14 patch 0   lisinopril (ZESTRIL) 20 MG tablet TAKE 1 TABLET (20 MG TOTAL) BY MOUTH DAILY. 30 tablet 1   metoprolol tartrate (LOPRESSOR) 25 MG  tablet Take 1 tablet (25 mg total) by mouth 2 (two) times daily. 60 tablet 0   Misc. Devices MISC Please provide patient with insurance approved supplies for her CPAP: tubing, chin strap and supplies and filters. 1 each 0   Multiple Vitamin (MULTIVITAMIN) capsule Take 1 capsule by mouth daily.     omeprazole (PRILOSEC) 40 MG capsule Take 1 capsule (40 mg total) by mouth 2 (two) times daily before a meal. 180 capsule 3   traZODone (DESYREL) 100 MG tablet Take 0.5-1 tablets (50-100 mg total) by mouth at bedtime. (Patient taking differently: Take 100 mg by mouth at bedtime.) 90 tablet 1   venlafaxine XR (EFFEXOR-XR) 150 MG 24 hr capsule Take 1 capsule (150 mg  total) by mouth every morning. 30 capsule 1   No current facility-administered medications for this visit.    Allergies:   Iodinated contrast media   Social History:  The patient  reports that she has been smoking cigarettes. She has a 10.00 pack-year smoking history. She has been exposed to tobacco smoke. She has never used smokeless tobacco. She reports that she does not drink alcohol and does not use drugs.   Family History:  The patient's family history includes Diabetes in her father; Healthy in her mother; Heart attack in her father and sister.  ROS:  Please see the history of present illness.    All other systems are reviewed and otherwise negative.   PHYSICAL EXAM:  VS:  There were no vitals taken for this visit. BMI: There is no height or weight on file to calculate BMI. Well nourished, well developed, in no acute distress HEENT: normocephalic, atraumatic Neck: no JVD, carotid bruits or masses Cardiac:   RRR; no significant murmurs, no rubs, or gallops Lungs:  CTA b/l, no wheezing, rhonchi or rales Abd: soft, nontender MS: no deformity or atrophy Ext: no edema Skin: warm and dry, no rash Neuro:  No gross deficits appreciated Psych: euthymic mood, full affect    EKG:  done tdoay and reviewed by myself Sinus arrhythmia 69bpm, had a slight sinus slowing to 1.4 second > junctional beat Appears similar to priors  06/28/22: EPS/ablation CONCLUSIONS:  1. Sinus rhythm upon presentation.  2. 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 AV conduction appears nodal. 4. Severely dilated CS and CS ostium with inferiorly displaced fast pathway fibers 5. Radiofrequency ablation of the slow pathway region with residual slow pathway conduction and inducible AVNRT 6. No early apparent complications.  7. Continue metoprolol. If breakthrough AVNRT, consider antiarrhythmic drug therapy.   Recent Labs: 03/21/2022: Magnesium 1.9 04/04/2022: TSH 2.720 08/16/2022: BUN 12;  Creatinine, Ser 0.73; Hemoglobin 11.4; Platelets 369; Potassium 3.8; Sodium 139  07/11/2022: Chol/HDL Ratio 3.5; Cholesterol, Total 151; HDL 43; LDL Chol Calc (NIH) 91; Triglycerides 91   Estimated Creatinine Clearance: 75.5 mL/min (by C-G formula based on SCr of 0.73 mg/dL).   Wt Readings from Last 3 Encounters:  08/16/22 170 lb (77.1 kg)  07/11/22 172 lb (78 kg)  07/10/22 171 lb 3.2 oz (77.7 kg)     Other studies reviewed: Additional studies/records reviewed today include: summarized above  ASSESSMENT AND PLAN:  SVT EPS/ablation as above Incomplete ablation of slow pathway No recurrent symptoms of her SVT  Sinus arrhythmia, (accelerated) junctional beat. D/w dr. Quentin Ore, resume lopressor '25mg'$  BID, see her back in 6 weeks with an EKG  Disposition: F/u as above  Current medicines are reviewed at length with the patient today.  The patient  did not have any concerns regarding medicines.  Venetia Night, PA-C 08/22/2022 7:22 AM     [facility] [address] [place] [address] [phone] (office)  [phone] (fax)

## 2022-09-04 ENCOUNTER — Ambulatory Visit: Payer: Managed Care, Other (non HMO)

## 2022-09-04 DIAGNOSIS — G4733 Obstructive sleep apnea (adult) (pediatric): Secondary | ICD-10-CM

## 2022-09-05 DIAGNOSIS — G4733 Obstructive sleep apnea (adult) (pediatric): Secondary | ICD-10-CM | POA: Diagnosis not present

## 2022-09-06 ENCOUNTER — Telehealth: Payer: Self-pay | Admitting: Nurse Practitioner

## 2022-09-07 ENCOUNTER — Encounter (HOSPITAL_COMMUNITY): Payer: Self-pay

## 2022-09-07 ENCOUNTER — Emergency Department (HOSPITAL_COMMUNITY)
Admission: EM | Admit: 2022-09-07 | Discharge: 2022-09-07 | Discharge status: Against Medical Advice | Payer: Managed Care, Other (non HMO) | Attending: Emergency Medicine | Admitting: Emergency Medicine

## 2022-09-07 ENCOUNTER — Emergency Department (HOSPITAL_COMMUNITY): Payer: Managed Care, Other (non HMO)

## 2022-09-07 ENCOUNTER — Other Ambulatory Visit: Payer: Self-pay

## 2022-09-07 DIAGNOSIS — R0602 Shortness of breath: Secondary | ICD-10-CM | POA: Insufficient documentation

## 2022-09-07 DIAGNOSIS — Z5321 Procedure and treatment not carried out due to patient leaving prior to being seen by health care provider: Secondary | ICD-10-CM | POA: Diagnosis not present

## 2022-09-07 DIAGNOSIS — R0789 Other chest pain: Secondary | ICD-10-CM | POA: Insufficient documentation

## 2022-09-07 DIAGNOSIS — R42 Dizziness and giddiness: Secondary | ICD-10-CM | POA: Insufficient documentation

## 2022-09-07 NOTE — ED Triage Notes (Signed)
Patient c/o mid chest and pain under the left breast since around 1400 today. Patient reports a history of heart issues and states she had an ablation in September/2023.  Patient also c/o SOB when the chest pain first started.

## 2022-09-07 NOTE — ED Provider Triage Note (Signed)
Emergency Medicine Provider Triage Evaluation Note  Lauren [name] , a 56 y.o. female  was evaluated in triage.  Pt complains of chest pain.  Patient states she was at work today around 2 PM when she suddenly developed chest pain.  She described the pain as tight, located on the left side, intermittent, sharp, radiates to her left armpit and back.  Patient had an ablation in September.  Endorses shortness of breath and dizziness.  Denies shortness of breath, fever, nausea, vomiting, bowel changes, urinary symptoms.  Review of Systems  Positive: As above Negative: As above  Physical Exam  BP 136/75 (BP Location: Left Arm)   Pulse 73   Temp 98.4 F (36.9 C) (Oral)   Resp 13   Ht '5\' 2"'$  (1.575 m)   Wt 77.1 kg   SpO2 97%   BMI 31.09 kg/m  Gen:   Awake, no distress   Resp:  Normal effort  MSK:   Moves extremities without difficulty  Other:    Medical Decision Making  Medically screening exam initiated at 6:04 PM.  Appropriate orders placed.  [name] was informed that the remainder of the evaluation will be completed by another provider, this initial triage assessment does not replace that evaluation, and the importance of remaining in the ED until their evaluation is complete.     Rex Kras, [place] 09/07/22 2245

## 2022-09-07 NOTE — ED Notes (Signed)
Attempted x 2 x 2 different people to draw blood without success.

## 2022-09-07 NOTE — ED Notes (Signed)
Called for room x 2 with no answer

## 2022-09-16 NOTE — Telephone Encounter (Signed)
Closing encounter

## 2022-09-25 ENCOUNTER — Ambulatory Visit (INDEPENDENT_AMBULATORY_CARE_PROVIDER_SITE_OTHER): Payer: Managed Care, Other (non HMO) | Admitting: Nurse Practitioner

## 2022-09-25 ENCOUNTER — Encounter: Payer: Self-pay | Admitting: Nurse Practitioner

## 2022-09-25 VITALS — BP 132/78 | HR 83 | Ht 62.0 in | Wt 169.6 lb

## 2022-09-25 DIAGNOSIS — E669 Obesity, unspecified: Secondary | ICD-10-CM | POA: Diagnosis not present

## 2022-09-25 DIAGNOSIS — G4733 Obstructive sleep apnea (adult) (pediatric): Secondary | ICD-10-CM | POA: Diagnosis not present

## 2022-09-25 NOTE — Patient Instructions (Addendum)
Start CPAP therapy 5-15 cmH2O, mask of choice and heated humidification Wear every night, minimum of 4-6 hours a night.  Change equipment every 30 days or as directed by DME. Wash your tubing with warm soap and water daily, hang to dry. Wash humidifier portion weekly.  Be aware of reduced alertness and do not drive or operate heavy machinery if experiencing this or drowsiness.  Exercise encouraged, as tolerated. Avoid or decrease alcohol consumption and medications that make you more sleepy, if possible. Notify if persistent daytime sleepiness occurs even with consistent use of CPAP.  We discussed how untreated sleep apnea puts an individual at risk for cardiac arrhthymias, pulm HTN, DM, stroke and increases their risk for daytime accidents. We also briefly reviewed treatment options including weight loss, side sleeping position, oral appliance, CPAP therapy or referral to ENT for possible surgical options  Follow up with Dr. Ander Slade in new pt 30 min slot in 12 weeks to follow up on CPAP. If symptoms worsen, please contact office for sooner follow up

## 2022-09-25 NOTE — Progress Notes (Signed)
$'@Patient'r$  ID: Lauren [name], female    DOB: 1965/12/13, 56 y.o.   MRN: [id number]  Chief Complaint  Patient presents with   Follow-up    Here to go over HST results    Referring provider: No ref. provider found  HPI: 56 year old female, active smoker followed for mild OSA.  Past medical history significant for GERD, depression, HLD, HTN.  TEST/EVENTS:  02/13/2018 NPSG: AHI 5.7, SpO2 low 87%. Weight 150 lb 09/04/2022 HST: AHI 11.7/h, SpO2 low 79%  06/21/2022: OV with Eltha Tingley NP for sleep consult.  She was previously diagnosed with OSA around 10 years ago and was on CPAP therapy.  She ended up losing her insurance when she moved here and was working for a temp agency so had to return her CPAP.  She has been off of this since.  She was referred here in 2019 and saw Dr. Annamaria Boots.  She underwent in lab study which showed mild OSA with AHI 5.7. She was advised at that time that she no longer needed CPAP therapy and encouraged to focus on weight loss measures, per the patient.  Since then, she has been struggling with daytime fatigue symptoms; feels like they have gotten worse over the past year or two.  She also wakes up 3-4 times a night.  Occasionally feels like she wakes up gasping for air.  Wakes in the morning feeling poorly rested.  Has been told that she snores.  Struggles with morning headaches.  She denies any drowsy driving, sleep parasomnia/paralysis, cataplexy. She goes to bed around 10 PM.  Takes 1 to 2 hours to fall asleep.  She was started on trazodone which seems to be helping with this.  She still wakes up multiple times at night, sometimes to use the restroom.  Officially gets out of the bed in the morning around 7 AM.  She has been struggling with SVT and is being followed by cardiology.  They are planning to do an ablation and also recommended that she be worked up for underlying sleep apnea. She has gained about 20 lb since her previous sleep study in 2019.  She smokes half a pack a  day.  Does not drink alcohol.  Lives with her daughter.  She works as the Psychologist, educational at [employer].  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  Family history of asthma and heart disease.  Epworth 8  09/25/2022: Today - follow up Patient presents today for follow up after home sleep study which revealed mild OSA with AHI 11.7, worse when compared to previous study 2019. She struggles with restless sleep and daytime fatigue. She was on CPAP many years ago but she moved here and her insurance changed so she had to return it. She really would like to restart therapy because she felt much better on it. She feels unchanged compared to when she was here last. Denies drowsy driving, sleep parasomnias/paralysis. No history of narcolepsy and cataplexy. Followed by cardiology for SVT  Allergies  Allergen Reactions   Iodinated Contrast Media Nausea Only    Immunization History  Administered Date(s) Administered   Tdap 06/14/2019    Past Medical History:  Diagnosis Date   Anxiety    Arthritis    Depression    GERD (gastroesophageal reflux disease)    Hypertension    Prediabetes    Sleep apnea    Past hx - no cpap now    SVT (supraventricular tachycardia)     Tobacco History:  Social History   Tobacco Use  Smoking Status Every Day   Packs/day: 0.50   Years: 20.00   Total pack years: 10.00   Types: Cigarettes   Passive exposure: Current  Smokeless Tobacco Never  Tobacco Comments   Smokes less than 1/2 pack per day 09/25/22 pap   Ready to quit: Not Answered Counseling given: Not Answered Tobacco comments: Smokes less than 1/2 pack per day 09/25/22 pap   Outpatient Medications Prior to Visit  Medication Sig Dispense Refill   albuterol (PROAIR HFA) 108 (90 Base) MCG/ACT inhaler Inhale 1-2 puffs into the lungs every 6 (six) hours as needed for wheezing or shortness of breath. 8.5 g 2   aspirin EC 81 MG tablet Take 81 mg by mouth daily. Swallow whole.      atorvastatin (LIPITOR) 20 MG tablet Take 1 tablet (20 mg total) by mouth daily. 90 tablet 3   busPIRone (BUSPAR) 15 MG tablet Take 15 mg by mouth 2 (two) times daily.     lamoTRIgine (LAMICTAL) 25 MG tablet Take 25 mg by mouth 2 (two) times daily.     lisinopril (ZESTRIL) 20 MG tablet TAKE 1 TABLET (20 MG TOTAL) BY MOUTH DAILY. 30 tablet 1   metoprolol tartrate (LOPRESSOR) 25 MG tablet Take 1 tablet (25 mg total) by mouth 2 (two) times daily. 180 tablet 1   Misc. Devices MISC Please provide patient with insurance approved supplies for her CPAP: tubing, chin strap and supplies and filters. 1 each 0   Multiple Vitamin (MULTIVITAMIN) capsule Take 1 capsule by mouth daily.     omeprazole (PRILOSEC) 40 MG capsule Take 1 capsule (40 mg total) by mouth 2 (two) times daily before a meal. 180 capsule 3   traZODone (DESYREL) 100 MG tablet Take 0.5-1 tablets (50-100 mg total) by mouth at bedtime. 90 tablet 1   venlafaxine XR (EFFEXOR-XR) 150 MG 24 hr capsule Take 1 capsule (150 mg total) by mouth every morning. 30 capsule 1   No facility-administered medications prior to visit.     Review of Systems:   Constitutional: No night sweats, fevers, chills. +excessive daytime fatigue, weight gain (20 lb) HEENT: No difficulty swallowing, tooth/dental problems, or sore throat. No sneezing, itching, ear ache, nasal congestion, or post nasal drip. +morning headaches CV:  +palpitations. No chest pain, orthopnea, PND, swelling in lower extremities, anasarca, dizziness, syncope Resp: +snoring; occasional shortness of breath with exertion (related to SVT). No excess mucus or change in color of mucus. No productive or non-productive. No hemoptysis. No wheezing.  No chest wall deformity GI:  No heartburn, indigestion, abdominal pain, nausea, vomiting, diarrhea, change in bowel habits, loss of appetite, bloody stools.  GU: No dysuria, change in color of urine, urgency or frequency.  No flank pain, no hematuria  Skin: No  rash, lesions, ulcerations MSK:  No joint pain or swelling.  No decreased range of motion.  No back pain. Neuro: No dizziness or lightheadedness.  Psych: No depression or anxiety. Mood stable. +sleep disturbance    Physical Exam:  BP 132/78 (BP Location: Right Arm, Patient Position: Sitting, Cuff Size: Normal)   Pulse 83   Ht '5\' 2"'$  (1.575 m)   Wt 169 lb 9.6 oz (76.9 kg)   SpO2 96%   BMI 31.02 kg/m   GEN: Pleasant, interactive, well-appearing; obese; in no acute distress HEENT:  Normocephalic and atraumatic. PERRLA. Sclera white. Nasal turbinates pink, moist and patent bilaterally. No rhinorrhea present. Oropharynx pink and moist, without exudate or edema. No  lesions, ulcerations, or postnasal drip. Mallampati II NECK:  Supple w/ fair ROM. No JVD present. Normal carotid impulses w/o bruits. Thyroid symmetrical with no goiter or nodules palpated. No lymphadenopathy.   CV: RRR, no m/r/g, no peripheral edema. Pulses intact, +2 bilaterally. No cyanosis, pallor or clubbing. PULMONARY:  Unlabored, regular breathing. Clear bilaterally A&P w/o wheezes/rales/rhonchi. No accessory muscle use. No dullness to percussion. GI: BS present and normoactive. Soft, non-tender to palpation. No organomegaly or masses detected.  MSK: No erythema, warmth or tenderness. Cap refil <2 sec all extrem. No deformities or joint swelling noted.  Neuro: A/Ox3. No focal deficits noted.   Skin: Warm, no lesions or rashe Psych: Normal affect and behavior. Judgement and thought content appropriate.     Lab Results:  CBC    Component Value Date/Time   WBC 7.6 08/16/2022 1441   RBC 4.07 08/16/2022 1441   HGB 11.4 (L) 08/16/2022 1441   HGB 11.6 06/03/2022 1327   HCT 36.0 08/16/2022 1441   HCT 34.6 06/03/2022 1327   PLT 369 08/16/2022 1441   PLT 397 06/03/2022 1327   MCV 88.5 08/16/2022 1441   MCV 83 06/03/2022 1327   MCH 28.0 08/16/2022 1441   MCHC 31.7 08/16/2022 1441   RDW 14.8 08/16/2022 1441   RDW 16.3  (H) 06/03/2022 1327   LYMPHSABS 3.3 (H) 06/03/2022 1327   MONOABS 0.6 02/11/2021 1732   EOSABS 0.2 06/03/2022 1327   BASOSABS 0.0 06/03/2022 1327    BMET    Component Value Date/Time   NA 139 08/16/2022 1441   NA 142 06/03/2022 1327   K 3.8 08/16/2022 1441   CL 108 08/16/2022 1441   CO2 23 08/16/2022 1441   GLUCOSE 111 (H) 08/16/2022 1441   BUN 12 08/16/2022 1441   BUN 7 06/03/2022 1327   CREATININE 0.73 08/16/2022 1441   CALCIUM 9.2 08/16/2022 1441   GFRNONAA >60 08/16/2022 1441   GFRAA 118 10/31/2020 1455    BNP No results found for: "BNP"   Imaging:  DG Chest 2 View  Result Date: 09/07/2022 CLINICAL DATA:  Chest pain EXAM: CHEST - 2 VIEW COMPARISON:  08/16/22 CXR FINDINGS: Pleural effusion. No pneumothorax. No focal airspace opacity. No displaced rib fractures. Normal cardiac and mediastinal contours. Vertebral body heights are maintained. Visualized upper abdomen is unremarkable. Incompletely imaged cervical spinal fusion hardware. Aortic atherosclerotic calcifications. IMPRESSION: No radiographic finding to explain chest pain. Electronically Signed   By: Marin Roberts M.D.   On: 09/07/2022 18:01          No data to display          No results found for: "NITRICOXIDE"      Assessment & Plan:   OSA (obstructive sleep apnea) Mild OSA with AHI 11.7/h; worse when compared to 2019 study with AHI 5.7. She has significant daytime symptoms and poor quality sleep. Given this, she would like to restart CPAP therapy. Orders placed today for auto CPAP 5-15 cmH2O. Instructed on proper use and maintenance. Cautioned on safe driving practices. Encouraged to work on healthy weight loss measures.   Patient Instructions  Start CPAP therapy 5-15 cmH2O, mask of choice and heated humidification Wear every night, minimum of 4-6 hours a night.  Change equipment every 30 days or as directed by DME. Wash your tubing with warm soap and water daily, hang to dry. Wash humidifier  portion weekly.  Be aware of reduced alertness and do not drive or operate heavy machinery if experiencing this or drowsiness.  Exercise encouraged, as tolerated. Avoid or decrease alcohol consumption and medications that make you more sleepy, if possible. Notify if persistent daytime sleepiness occurs even with consistent use of CPAP.  We discussed how untreated sleep apnea puts an individual at risk for cardiac arrhthymias, pulm HTN, DM, stroke and increases their risk for daytime accidents. We also briefly reviewed treatment options including weight loss, side sleeping position, oral appliance, CPAP therapy or referral to ENT for possible surgical options  Follow up with Dr. Ander Slade in new pt 30 min slot in 12 weeks to follow up on CPAP. If symptoms worsen, please contact office for sooner follow up   Obesity (BMI 30-39.9) Healthy weight loss measures encouraged.    I spent 32 minutes of dedicated to the care of this patient on the date of this encounter to include pre-visit review of records, face-to-face time with the patient discussing conditions above, post visit ordering of testing, clinical documentation with the electronic health record, making appropriate referrals as documented, and communicating necessary findings to members of the patients care team.  Clayton Bibles, NP 09/25/2022  Pt aware and understands NP's role.

## 2022-09-25 NOTE — Assessment & Plan Note (Signed)
Mild OSA with AHI 11.7/h; worse when compared to 2019 study with AHI 5.7. She has significant daytime symptoms and poor quality sleep. Given this, she would like to restart CPAP therapy. Orders placed today for auto CPAP 5-15 cmH2O. Instructed on proper use and maintenance. Cautioned on safe driving practices. Encouraged to work on healthy weight loss measures.   Patient Instructions  Start CPAP therapy 5-15 cmH2O, mask of choice and heated humidification Wear every night, minimum of 4-6 hours a night.  Change equipment every 30 days or as directed by DME. Wash your tubing with warm soap and water daily, hang to dry. Wash humidifier portion weekly.  Be aware of reduced alertness and do not drive or operate heavy machinery if experiencing this or drowsiness.  Exercise encouraged, as tolerated. Avoid or decrease alcohol consumption and medications that make you more sleepy, if possible. Notify if persistent daytime sleepiness occurs even with consistent use of CPAP.  We discussed how untreated sleep apnea puts an individual at risk for cardiac arrhthymias, pulm HTN, DM, stroke and increases their risk for daytime accidents. We also briefly reviewed treatment options including weight loss, side sleeping position, oral appliance, CPAP therapy or referral to ENT for possible surgical options  Follow up with Dr. Ander Slade in new pt 30 min slot in 12 weeks to follow up on CPAP. If symptoms worsen, please contact office for sooner follow up

## 2022-09-25 NOTE — Assessment & Plan Note (Signed)
Healthy weight loss measures encouraged.  

## 2022-10-02 NOTE — Progress Notes (Deleted)
Cardiology Office Note Date:  10/02/2022  Patient ID:  Lauren [name], Lauren [name] 04/22/1966, MRN [id number] PCP:  System, Provider Not In  Electrophysiologist: Dr. Quentin Ore   Chief Complaint: *** planned f/u  History of Present Illness: Lauren [name] is a 56 y.o. female with history of HTN, GERD, SVT (adenosine sensitive), sleeps apnea (untreated, reports unable to afford CPAP therapy), smoker, family CAD hx  Referred to Dr. Quentin Ore, saw him 05/27/22 after an ER visit for SVT, planned for ablation. EPS/ablation 06/28/22 Noted severely dilated CS and CS ostium with inferiorly displaced fast pathway fibers. Radiofrequency ablation of the slow pathway region with residual slow pathway conduction and inducible AVNRT. Planned to consider AAD if recurrent SVT  She saw Dr. Audie Box 07/11/22, wakes with a cough, suspected her sleep apnea, pending new testing.  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 No palpitations/SVTs  08/16/22, ER visit with back and LE pain, "feels like sciatica that she has had on her left lower extremity." Discharged with PMD follow up, Rx lidoderm.  I saw her 08/22/22 She is doing well No CP, no procedure site concerns, no SOB. She is having palpitations, not her SVT. She has difficulty describing them, they are worrisome, but perhaps not particularly symptomatic. Not all day every day. She felt very well after her EP/ablation, was taking her lopressor as written '25mg'$  BID.  When she went for refill, thsy told her she was early, her PMD reported that she had been taking them wrong and advised to reduce to 12.'5mg'$  once daily. Since then the palpitations started. No near syncope or syncope, no dizzy spells EKG was Sinus arrhythmia 69bpm, had a slight sinus slowing to 1.4 second > junctional beat, similar to priors and in d/w Dr. Quentin Ore, resumed BID dosing on her lopressor and planned for early f/u and EKG.  She had an ER visit 11/81/23 with CP,  2 attempts at blood draw were unsuccessful, looks like she eventually left without further evaluation.  Saw pulm team 09/25/22, sleep study noted mild OSA, planned to resume CPAP therapy and encourage weight loss  *** CP? *** symptoms, brady, palps ***    Past Medical History:  Diagnosis Date   Anxiety    Arthritis    Depression    GERD (gastroesophageal reflux disease)    Hypertension    Prediabetes    Sleep apnea    Past hx - no cpap now    SVT (supraventricular tachycardia)     Past Surgical History:  Procedure Laterality Date   ABDOMINAL HYSTERECTOMY     BACK SURGERY     CESAREAN SECTION     COLONOSCOPY     HAND SURGERY     NECK SURGERY  2002   plate in her neck   SVT ABLATION N/A 06/28/2022   Procedure: SVT ABLATION;  Surgeon: Vickie Epley, MD;  Location: [facility];  Service: Cardiovascular;  Laterality: N/A;    Current Outpatient Medications  Medication Sig Dispense Refill   albuterol (PROAIR HFA) 108 (90 Base) MCG/ACT inhaler Inhale 1-2 puffs into the lungs every 6 (six) hours as needed for wheezing or shortness of breath. 8.5 g 2   aspirin EC 81 MG tablet Take 81 mg by mouth daily. Swallow whole.     atorvastatin (LIPITOR) 20 MG tablet Take 1 tablet (20 mg total) by mouth daily. 90 tablet 3   busPIRone (BUSPAR) 15 MG tablet Take 15 mg by mouth 2 (two) times  daily.     lamoTRIgine (LAMICTAL) 25 MG tablet Take 25 mg by mouth 2 (two) times daily.     lisinopril (ZESTRIL) 20 MG tablet TAKE 1 TABLET (20 MG TOTAL) BY MOUTH DAILY. 30 tablet 1   metoprolol tartrate (LOPRESSOR) 25 MG tablet Take 1 tablet (25 mg total) by mouth 2 (two) times daily. 180 tablet 1   Misc. Devices MISC Please provide patient with insurance approved supplies for her CPAP: tubing, chin strap and supplies and filters. 1 each 0   Multiple Vitamin (MULTIVITAMIN) capsule Take 1 capsule by mouth daily.     omeprazole (PRILOSEC) 40 MG capsule Take 1 capsule (40 mg total) by mouth 2 (two)  times daily before a meal. 180 capsule 3   traZODone (DESYREL) 100 MG tablet Take 0.5-1 tablets (50-100 mg total) by mouth at bedtime. 90 tablet 1   venlafaxine XR (EFFEXOR-XR) 150 MG 24 hr capsule Take 1 capsule (150 mg total) by mouth every morning. 30 capsule 1   No current facility-administered medications for this visit.    Allergies:   Iodinated contrast media   Social History:  The patient  reports that she has been smoking cigarettes. She has a 10.00 pack-year smoking history. She has been exposed to tobacco smoke. She has never used smokeless tobacco. She reports that she does not drink alcohol and does not use drugs.   Family History:  The patient's family history includes Diabetes in her father; Healthy in her mother; Heart attack in her father and sister.  ROS:  Please see the history of present illness.    All other systems are reviewed and otherwise negative.   PHYSICAL EXAM:  VS:  There were no vitals taken for this visit. BMI: There is no height or weight on file to calculate BMI. Well nourished, well developed, in no acute distress HEENT: normocephalic, atraumatic Neck: no JVD, carotid bruits or masses Cardiac:   *** RRR; no significant murmurs, no rubs, or gallops Lungs:  *** CTA b/l, no wheezing, rhonchi or rales Abd: soft, nontender MS: no deformity or atrophy Ext: *** no edema Skin: warm and dry, no rash Neuro:  No gross deficits appreciated Psych: euthymic mood, full affect    EKG:  done tdoay and reviewed by myself *** 08/22/22: Sinus arrhythmia 69bpm, had a slight sinus slowing to 1.4 second > junctional beat Appears similar to priors  06/28/22: EPS/ablation CONCLUSIONS:  1. Sinus rhythm upon presentation.  2. 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 AV conduction appears nodal. 4. Severely dilated CS and CS ostium with inferiorly displaced fast pathway fibers 5. Radiofrequency ablation of the slow pathway region with residual slow  pathway conduction and inducible AVNRT 6. No early apparent complications.  7. Continue metoprolol. If breakthrough AVNRT, consider antiarrhythmic drug therapy.   Recent Labs: 03/21/2022: Magnesium 1.9 04/04/2022: TSH 2.720 08/16/2022: BUN 12; Creatinine, Ser 0.73; Hemoglobin 11.4; Platelets 369; Potassium 3.8; Sodium 139  07/11/2022: Chol/HDL Ratio 3.5; Cholesterol, Total 151; HDL 43; LDL Chol Calc (NIH) 91; Triglycerides 91   CrCl cannot be calculated (Patient's most recent lab result is older than the maximum 21 days allowed.).   Wt Readings from Last 3 Encounters:  09/25/22 169 lb 9.6 oz (76.9 kg)  09/07/22 170 lb (77.1 kg)  08/22/22 173 lb 6.4 oz (78.7 kg)     Other studies reviewed: Additional studies/records reviewed today include: summarized above  ASSESSMENT AND PLAN:  SVT EPS/ablation as above Incomplete ablation of slow pathway ***  No recurrent symptoms of her SVT   Disposition: ***  Current medicines are reviewed at length with the patient today.  The patient did not have any concerns regarding medicines.  Venetia Night, PA-C 10/02/2022 7:33 AM     [address] [address] [phone] (office)  [phone] (fax)

## 2022-10-03 ENCOUNTER — Ambulatory Visit: Payer: Managed Care, Other (non HMO) | Admitting: Physician Assistant

## 2022-12-09 ENCOUNTER — Other Ambulatory Visit: Payer: Self-pay

## 2022-12-09 ENCOUNTER — Encounter (HOSPITAL_BASED_OUTPATIENT_CLINIC_OR_DEPARTMENT_OTHER): Payer: Self-pay | Admitting: Emergency Medicine

## 2022-12-09 ENCOUNTER — Emergency Department (HOSPITAL_BASED_OUTPATIENT_CLINIC_OR_DEPARTMENT_OTHER)
Admission: EM | Admit: 2022-12-09 | Discharge: 2022-12-09 | Disposition: A | Discharge status: Home / Self Care | Payer: Managed Care, Other (non HMO) | Attending: Emergency Medicine | Admitting: Emergency Medicine

## 2022-12-09 DIAGNOSIS — S61012A Laceration without foreign body of left thumb without damage to nail, initial encounter: Secondary | ICD-10-CM | POA: Insufficient documentation

## 2022-12-09 DIAGNOSIS — Z79899 Other long term (current) drug therapy: Secondary | ICD-10-CM | POA: Insufficient documentation

## 2022-12-09 DIAGNOSIS — Z7982 Long term (current) use of aspirin: Secondary | ICD-10-CM | POA: Insufficient documentation

## 2022-12-09 DIAGNOSIS — W268XXA Contact with other sharp object(s), not elsewhere classified, initial encounter: Secondary | ICD-10-CM | POA: Insufficient documentation

## 2022-12-09 MED ORDER — CEPHALEXIN 500 MG PO CAPS: 500.0000 mg | ORAL_CAPSULE | Freq: Four times a day (QID) | ORAL | 0 refills | Status: AC

## 2022-12-09 MED ORDER — LIDOCAINE HCL (PF) 1 % IJ SOLN
5.0000 mL | Freq: Once | INTRAMUSCULAR | Status: AC
  Administered 2022-12-09: 5 mL
  Filled 2022-12-09: qty 5

## 2022-12-09 NOTE — ED Provider Notes (Signed)
[facility] Provider Note   CSN: [id number] Arrival date & time: 12/09/22  1546     History  Chief Complaint  Patient presents with   Laceration    Lauren [name] is a 57 y.o. female presents to the ED complaining of laceration to her left thumb.  Patient states she was opening a can of beans when her hand slipped and got caught on the sharp metal.  She denies other injury from this incident.  Denies numbness or tingling to her thumb.  She has normal range of motion.  She does not take blood thinners.  She reports her tetanus is up-to-date.       Home Medications Prior to Admission medications   Medication Sig Start Date End Date Taking? Authorizing Provider  cephALEXin (KEFLEX) 500 MG capsule Take 1 capsule (500 mg total) by mouth 4 (four) times daily for 2 days. 12/09/22 12/11/22 Yes Bishoy Cupp R, PA  albuterol (PROAIR HFA) 108 (90 Base) MCG/ACT inhaler Inhale 1-2 puffs into the lungs every 6 (six) hours as needed for wheezing or shortness of breath. 12/17/21   Charlott Rakes, MD  aspirin EC 81 MG tablet Take 81 mg by mouth daily. Swallow whole.    [provider]  atorvastatin (LIPITOR) 20 MG tablet Take 1 tablet (20 mg total) by mouth daily. 07/15/22 07/10/23  O'NealCassie Freer, MD  busPIRone (BUSPAR) 15 MG tablet Take 15 mg by mouth 2 (two) times daily. 05/08/22   [provider]  lamoTRIgine (LAMICTAL) 25 MG tablet Take 25 mg by mouth 2 (two) times daily. 05/18/22   [provider]  lisinopril (ZESTRIL) 20 MG tablet TAKE 1 TABLET (20 MG TOTAL) BY MOUTH DAILY. 02/01/22 02/01/23  Charlott Rakes, MD  metoprolol tartrate (LOPRESSOR) 25 MG tablet Take 1 tablet (25 mg total) by mouth 2 (two) times daily. 08/22/22   Baldwin Jamaica, PA-C  Misc. Devices MISC Please provide patient with insurance approved supplies for her CPAP: tubing, chin strap and supplies and filters. 11/17/18   Gildardo Pounds, NP  Multiple  Vitamin (MULTIVITAMIN) capsule Take 1 capsule by mouth daily.    [provider]  omeprazole (PRILOSEC) 40 MG capsule Take 1 capsule (40 mg total) by mouth 2 (two) times daily before a meal. 04/02/22   Thornton Park, MD  traZODone (DESYREL) 100 MG tablet Take 0.5-1 tablets (50-100 mg total) by mouth at bedtime. 01/08/22   Gildardo Pounds, NP  venlafaxine XR (EFFEXOR-XR) 150 MG 24 hr capsule Take 1 capsule (150 mg total) by mouth every morning. 02/01/22   Charlott Rakes, MD      Allergies    Iodinated contrast media    Review of Systems   Review of Systems  Skin:  Positive for wound (left thumb).  Neurological:  Negative for weakness and numbness.    Physical Exam Updated Vital Signs BP (!) 162/96 (BP Location: Right Arm)   Pulse 78   Temp 97.6 F (36.4 C) (Temporal)   Resp 15   Ht 5' 2"$  (1.575 m)   Wt 76.7 kg   SpO2 100%   BMI 30.91 kg/m  Physical Exam Vitals and nursing note reviewed.  Constitutional:      General: She is not in acute distress.    Appearance: Normal appearance. She is not ill-appearing or diaphoretic.  Cardiovascular:     Rate and Rhythm: Normal rate and regular rhythm.  Pulmonary:     Effort: Pulmonary effort is  normal.  Musculoskeletal:     Right hand: Laceration (Thumb of the left hand) and tenderness (Over laceration) present. No swelling, deformity or bony tenderness. Normal strength. Normal sensation. Normal capillary refill.  Neurological:     Mental Status: She is alert. Mental status is at baseline.  Psychiatric:        Mood and Affect: Mood normal.        Behavior: Behavior normal.     ED Results / Procedures / Treatments   Labs (all labs ordered are listed, but only abnormal results are displayed) Labs Reviewed - No data to display  EKG None  Radiology No results found.  Procedures .Marland KitchenLaceration Repair  Date/Time: 12/09/2022 7:18 PM  Performed by: Pat Kocher, PA Authorized by: Pat Kocher, PA   Consent:     Consent obtained:  Verbal   Consent given by:  Patient   Risks, benefits, and alternatives were discussed: yes     Risks discussed:  Infection, pain, poor cosmetic result, poor wound healing, need for additional repair and nerve damage   Alternatives discussed:  No treatment, delayed treatment and observation Universal protocol:    Procedure explained and questions answered to patient or proxy's satisfaction: yes     Patient identity confirmed:  Verbally with patient and arm band Anesthesia:    Anesthesia method:  Local infiltration   Local anesthetic:  Lidocaine 1% WITH epi Laceration details:    Location:  Finger   Finger location:  L thumb   Length (cm):  2 Treatment:    Area cleansed with:  Povidone-iodine and saline   Amount of cleaning:  Standard   Irrigation solution:  Sterile saline   Irrigation method:  Syringe   Visualized foreign bodies/material removed: no     Debridement:  Minimal Skin repair:    Repair method:  Sutures   Suture size:  4-0   Suture material:  Prolene   Suture technique:  Simple interrupted   Number of sutures:  5 Approximation:    Approximation:  Close Repair type:    Repair type:  Simple Post-procedure details:    Dressing:  Antibiotic ointment and bulky dressing   Procedure completion:  Tolerated well, no immediate complications     Medications Ordered in ED Medications  lidocaine (PF) (XYLOCAINE) 1 % injection 5 mL (5 mLs Infiltration Given by Other 12/09/22 1810)    ED Course/ Medical Decision Making/ A&P                             Medical Decision Making Risk Prescription drug management.   This patient presents to the ED with chief complaint(s) of laceration to left thumb.  The complaint involves an extensive differential diagnosis and also carries with it a high risk of complications and morbidity.    The differential diagnosis includes simple laceration, complex laceration, tendon or nerve damage  The initial plan is to  assess and repair  Initial Assessment:   Exam significant for a 2 cm laceration to the pad of the left thumb.  Bleeding is controlled at this time.  No nail involvement.  Thumb is neurovascularly intact with normal range of motion.  She does have tenderness over the laceration.  Subcutaneous tissue is visible.  Treatment and Reassessment: Laceration was repaired with sutures, see procedure note for more detail.  Bulky dressing was applied and discussed laceration care at home.  Informed patient she will need to return in  10 days to have sutures removed.  Also placed patient on 2-day prophylaxis of cephalexin due to nature of injury.  Disposition:   The patient has been appropriately medically screened and/or stabilized in the ED. I have low suspicion for any other emergent medical condition which would require further screening, evaluation or treatment in the ED or require inpatient management. At time of discharge the patient is hemodynamically stable and in no acute distress. I have discussed work-up results and diagnosis with patient and answered all questions. Patient is agreeable with discharge plan. We discussed strict return precautions for returning to the emergency department and they verbalized understanding.    Social Determinants of Health:   Patient's lack of insurance  increases the complexity of managing their presentation         Final Clinical Impression(s) / ED Diagnoses Final diagnoses:  Laceration of left thumb without foreign body without damage to nail, initial encounter    Rx / DC Orders ED Discharge Orders          Ordered    cephALEXin (KEFLEX) 500 MG capsule  4 times daily        12/09/22 1839              Pat Kocher, [place] 12/09/22 1919    Malvin Johns, MD 12/09/22 2242

## 2022-12-09 NOTE — ED Triage Notes (Signed)
Pt arrives to ED with c/o laceration to left thumb after opening a can.

## 2022-12-09 NOTE — ED Notes (Signed)
Reviewed AVS/discharge instruction with patient. Time allotted for and all questions answered. Patient is agreeable for d/c and escorted to ed exit by staff.  

## 2022-12-09 NOTE — Discharge Instructions (Addendum)
Thank you for allow me to be part of your care today.  Your thumb was repaired with sutures that do not dissolve.  You will have to have these removed in 10 days.  You may go to an urgent care or return to the ER to have these removed.  Please monitor your wound for signs of infection including redness, swelling, pain, or drainage from the site.    I have sent over 2 days of cephalexin prophylaxis antibiotic for you to start tomorrow.  I recommend washing with warm soap and water, using bacitracin ointment, and keeping your thumb protected while working.

## 2022-12-16 ENCOUNTER — Ambulatory Visit (HOSPITAL_COMMUNITY): Payer: Medicaid Other

## 2022-12-18 ENCOUNTER — Ambulatory Visit (HOSPITAL_COMMUNITY)
Admission: RE | Admit: 2022-12-18 | Discharge: 2022-12-18 | Disposition: A | Discharge status: Home / Self Care | Payer: Self-pay | Source: Ambulatory Visit | Attending: Family Medicine | Admitting: Family Medicine

## 2022-12-18 ENCOUNTER — Encounter (HOSPITAL_COMMUNITY): Payer: Self-pay

## 2022-12-18 VITALS — BP 119/78 | HR 62 | Temp 98.3°F | Resp 16

## 2022-12-18 DIAGNOSIS — S61012D Laceration without foreign body of left thumb without damage to nail, subsequent encounter: Secondary | ICD-10-CM

## 2022-12-18 DIAGNOSIS — Z4802 Encounter for removal of sutures: Secondary | ICD-10-CM

## 2022-12-18 NOTE — ED Provider Notes (Signed)
  [facility]   [id number] 12/18/22 Arrival Time: [id number]  ASSESSMENT & PLAN:  1. Laceration of left thumb without foreign body without damage to nail, subsequent encounter   2. Encounter for removal of sutures    Wound has healed well. Sutures removed by RN. Aftercare inst on AVS.   After Visit Summary given.   SUBJECTIVE:  Lauren [name] is a 57 y.o. female who presents with a laceration of her L thumb; repaired in ED; here for suture removal. No concerns.  OBJECTIVE:  Vitals:   12/18/22 1316  BP: 119/78  Pulse: 62  Resp: 16  Temp: 98.3 F (36.8 C)  TempSrc: Oral  SpO2: 95%     General appearance: alert; no distress L thumb with healed laceration; intact sutures Psychological: alert and cooperative; normal mood and affect  Allergies  Allergen Reactions   Iodinated Contrast Media Nausea Only    Past Medical History:  Diagnosis Date   Anxiety    Arthritis    Depression    GERD (gastroesophageal reflux disease)    Hypertension    Prediabetes    Sleep apnea    Past hx - no cpap now    SVT (supraventricular tachycardia)    Social History   Socioeconomic History   Marital status: Divorced    Spouse name: Not on file   Number of children: 1   Years of education: Not on file   Highest education level: Not on file  Occupational History   Occupation: Barrister's clerk - Furniture conservator/restorer  Tobacco Use   Smoking status: Every Day    Packs/day: 0.50    Years: 20.00    Total pack years: 10.00    Types: Cigarettes    Passive exposure: Current   Smokeless tobacco: Never   Tobacco comments:    Smokes less than 1/2 pack per day 09/25/22 pap  Vaping Use   Vaping Use: Never used  Substance and Sexual Activity   Alcohol use: No   Drug use: No   Sexual activity: Not Currently  Other Topics Concern   Not on file  Social History Narrative   Not on file   Social Determinants of Health   Financial Resource Strain: Not on file  Food Insecurity: Not on  file  Transportation Needs: Not on file  Physical Activity: Not on file  Stress: Not on file  Social Connections: Not on file          Vanessa Kick, MD 12/18/22 [phone]

## 2022-12-18 NOTE — ED Triage Notes (Signed)
Presents for suture removal of left thumb. Pt denies c/o's. No S/S infection; wound well-approximated.

## 2023-01-13 ENCOUNTER — Other Ambulatory Visit: Payer: Self-pay | Admitting: *Deleted

## 2023-01-13 DIAGNOSIS — Z0000 Encounter for general adult medical examination without abnormal findings: Secondary | ICD-10-CM

## 2023-01-19 ENCOUNTER — Other Ambulatory Visit: Payer: Self-pay | Admitting: Physician Assistant

## 2023-01-24 ENCOUNTER — Ambulatory Visit: Payer: Medicaid Other | Admitting: Family Medicine

## 2023-01-27 ENCOUNTER — Ambulatory Visit: Payer: BC Managed Care – PPO | Admitting: Family Medicine

## 2023-01-27 ENCOUNTER — Encounter: Payer: Self-pay | Admitting: Family Medicine

## 2023-01-27 VITALS — BP 118/60 | HR 79 | Temp 98.9°F | Ht 63.5 in | Wt 169.6 lb

## 2023-01-27 DIAGNOSIS — R053 Chronic cough: Secondary | ICD-10-CM | POA: Insufficient documentation

## 2023-01-27 DIAGNOSIS — R7303 Prediabetes: Secondary | ICD-10-CM | POA: Diagnosis not present

## 2023-01-27 DIAGNOSIS — J312 Chronic pharyngitis: Secondary | ICD-10-CM

## 2023-01-27 DIAGNOSIS — R09A2 Foreign body sensation, throat: Secondary | ICD-10-CM

## 2023-01-27 MED ORDER — FLUTICASONE PROPIONATE 50 MCG/ACT NA SUSP
1.0000 | Freq: Every day | NASAL | 2 refills | Status: AC
Start: 2023-01-27 — End: ?

## 2023-01-27 NOTE — Progress Notes (Signed)
New Patient Office Visit  Subjective    Patient ID: Lauren [name], female    DOB: [birth year]  Age: 57 y.o. MRN: [id number]  CC:  Chief Complaint  Patient presents with  . Establish Care    HPI Lauren [name] presents to establish care Pt is reporting a new symptom today. She reports that whenever she lays down on her back or her right side and she is constantly coughing. She reports that she sleeps on a wedge pillow, not really so much when she lays on the left side. Has been going on for about 1 1/2 years. States that its a dry cough, nothing coming up. States that her throat feels scratchy but no lump or pressing on her throat, nothing stuck, just feels irritated. She has a history of OSA but was told that her OSA was mild on her home sleep test and that she did not need a CPAP machine, just to sleep on the wedge.  She also has a history of GERD and was given PPI BID, also had a scope done, was told that there was no acid reflux so she weaned herself off the medication because it was not helping with her coughing.   Pt also sees a pulmonologist, had PFT's done in June 2023 which were overall unremarkable, CT of chest done last year which did not show any bronchitis or emphysema.   Outpatient Encounter Medications as of 01/27/2023  Medication Sig  . albuterol (PROAIR HFA) 108 (90 Base) MCG/ACT inhaler Inhale 1-2 puffs into the lungs every 6 (six) hours as needed for wheezing or shortness of breath.  Marland Kitchen. aspirin EC 81 MG tablet Take 81 mg by mouth daily. Swallow whole.  Marland Kitchen. atorvastatin (LIPITOR) 20 MG tablet Take 20 mg by mouth daily.  . busPIRone (BUSPAR) 15 MG tablet Take 15 mg by mouth 2 (two) times daily.  . fluticasone (FLONASE) 50 MCG/ACT nasal spray Place 1 spray into both nostrils daily.  Marland Kitchen. lamoTRIgine (LAMICTAL) 25 MG tablet Take 25 mg by mouth 2 (two) times daily.  Marland Kitchen. lisinopril (ZESTRIL) 20 MG tablet TAKE 1 TABLET (20 MG TOTAL) BY MOUTH DAILY.  . metoprolol tartrate  (LOPRESSOR) 25 MG tablet TAKE 1 TABLET BY MOUTH TWICE A DAY  . Misc. Devices MISC Please provide patient with insurance approved supplies for her CPAP: tubing, chin strap and supplies and filters.  . Multiple Vitamin (MULTIVITAMIN) capsule Take 1 capsule by mouth daily.  . traZODone (DESYREL) 100 MG tablet Take 0.5-1 tablets (50-100 mg total) by mouth at bedtime.  Marland Kitchen. venlafaxine XR (EFFEXOR-XR) 150 MG 24 hr capsule Take 1 capsule (150 mg total) by mouth every morning.  . [DISCONTINUED] atorvastatin (LIPITOR) 20 MG tablet Take 1 tablet (20 mg total) by mouth daily.  . [DISCONTINUED] omeprazole (PRILOSEC) 40 MG capsule Take 1 capsule (40 mg total) by mouth 2 (two) times daily before a meal.   No facility-administered encounter medications on file as of 01/27/2023.    Past Medical History:  Diagnosis Date  . Anxiety   . Arthritis   . Depression   . GERD (gastroesophageal reflux disease)   . Hypertension   . Prediabetes   . Sleep apnea    Past hx - no cpap now   . SVT (supraventricular tachycardia)     Past Surgical History:  Procedure Laterality Date  . ABDOMINAL HYSTERECTOMY    . BACK SURGERY    . CESAREAN SECTION    . COLONOSCOPY    .  HAND SURGERY    . NECK SURGERY  2002   plate in her neck  . SVT ABLATION N/A 06/28/2022   Procedure: SVT ABLATION;  Surgeon: Lanier Prude, MD;  Location: [facility] INVASIVE CV LAB;  Service: Cardiovascular;  Laterality: N/A;    Family History  Problem Relation Age of Onset  . Healthy Mother   . Heart attack Father   . Diabetes Father   . Heart attack Sister   . Colon cancer Neg Hx   . Colon polyps Neg Hx   . Esophageal cancer Neg Hx   . Rectal cancer Neg Hx   . Stomach cancer Neg Hx     Social History   Socioeconomic History  . Marital status: Divorced    Spouse name: Not on file  . Number of children: 1  . Years of education: Not on file  . Highest education level: Not on file  Occupational History  . Occupation: Public affairs consultant -  Energy manager  Tobacco Use  . Smoking status: Every Day    Packs/day: 0.50    Years: 35.00    Additional pack years: 0.00    Total pack years: 17.50    Types: Cigarettes    Passive exposure: Current  . Smokeless tobacco: Never  . Tobacco comments:    Smokes less than 1/2 pack per day 09/25/22 pap  Vaping Use  . Vaping Use: Never used  Substance and Sexual Activity  . Alcohol use: No  . Drug use: No  . Sexual activity: Not Currently  Other Topics Concern  . Not on file  Social History Narrative  . Not on file   Social Determinants of Health   Financial Resource Strain: Not on file  Food Insecurity: Not on file  Transportation Needs: Not on file  Physical Activity: Not on file  Stress: Not on file  Social Connections: Not on file  Intimate Partner Violence: Not on file    Review of Systems  All other systems reviewed and are negative.       Objective    BP 118/60 (BP Location: Left Arm, Patient Position: Sitting, Cuff Size: Normal)   Pulse 79   Temp 98.9 F (37.2 C) (Oral)   Ht 5' 3.5" (1.613 m)   Wt 169 lb 9.6 oz (76.9 kg)   SpO2 98%   BMI 29.57 kg/m   Physical Exam Vitals reviewed.  Constitutional:      Appearance: Normal appearance. She is well-groomed and normal weight.  Eyes:     Conjunctiva/sclera: Conjunctivae normal.  Neck:     Thyroid: No thyromegaly.  Cardiovascular:     Rate and Rhythm: Normal rate and regular rhythm.     Pulses: Normal pulses.     Heart sounds: S1 normal and S2 normal.  Pulmonary:     Effort: Pulmonary effort is normal.     Breath sounds: Normal breath sounds and air entry.  Abdominal:     General: Bowel sounds are normal.  Musculoskeletal:     Right lower leg: No edema.     Left lower leg: No edema.  Neurological:     Mental Status: She is alert and oriented to person, place, and time. Mental status is at baseline.     Gait: Gait is intact.  Psychiatric:        Mood and Affect: Mood and affect normal.         Speech: Speech normal.        Behavior: Behavior normal.  Judgment: Judgment normal.    {Labs (Optional):23779}    Assessment & Plan:   Problem List Items Addressed This Visit       Unprioritized   Chronic cough   Relevant Medications   fluticasone (FLONASE) 50 MCG/ACT nasal spray   Prediabetes   Relevant Orders   CMP   Lipid Panel   Hemoglobin A1c   Other Visit Diagnoses     Chronic sore throat    -  Primary   Globus sensation       Relevant Orders   TSH       Return in about 6 months (around 07/29/2023).   Karie Georges, MD

## 2023-01-27 NOTE — Patient Instructions (Addendum)
Start -- cetirizine 10 mg daily or loratadine 10 mg once daily-- take at bedtime  Restart the flonase 1 spray per nostril daily at bedtime.

## 2023-01-29 NOTE — Assessment & Plan Note (Addendum)
Question Post nasal drip, will treat with flonase spray 1 spray per nostril daily to see if this will help. If it does not then I will send to ENT for evaluation, her voice is somewhat hoarse and she is a 1/2 ppd smoker. We discussed her quitting smoking briefly. Will also check TSH

## 2023-01-29 NOTE — Assessment & Plan Note (Signed)
Will check her annual labs today including lipid panel, CMP and A1c. She is not currently on any medication for this, will monitor this every 6 months.

## 2023-02-03 ENCOUNTER — Other Ambulatory Visit: Payer: BC Managed Care – PPO

## 2023-02-05 ENCOUNTER — Other Ambulatory Visit (INDEPENDENT_AMBULATORY_CARE_PROVIDER_SITE_OTHER): Payer: BC Managed Care – PPO

## 2023-02-05 DIAGNOSIS — R09A2 Foreign body sensation, throat: Secondary | ICD-10-CM

## 2023-02-05 DIAGNOSIS — R7303 Prediabetes: Secondary | ICD-10-CM | POA: Diagnosis not present

## 2023-02-05 LAB — LIPID PANEL
Cholesterol: 127 mg/dL (ref 0–200)
HDL: 52.5 mg/dL (ref >39.00)
LDL Cholesterol: 59 mg/dL (ref 0–99)
NonHDL: 74.99
Total CHOL/HDL Ratio: 2
Triglycerides: 82 mg/dL (ref 0.0–149.0)
VLDL: 16.4 mg/dL (ref 0.0–40.0)

## 2023-02-05 LAB — COMPREHENSIVE METABOLIC PANEL
ALT: 22 U/L (ref 0–35)
AST: 21 U/L (ref 0–37)
Albumin: 4.2 g/dL (ref 3.5–5.2)
Alkaline Phosphatase: 75 U/L (ref 39–117)
BUN: 12 mg/dL (ref 6–23)
CO2: 23 mEq/L (ref 19–32)
Calcium: 9.4 mg/dL (ref 8.4–10.5)
Chloride: 107 mEq/L (ref 96–112)
Creatinine, Ser: 0.73 mg/dL (ref 0.40–1.20)
GFR: 91.57 mL/min (ref >60.00)
Glucose, Bld: 96 mg/dL (ref 70–99)
Potassium: 4.1 mEq/L (ref 3.5–5.1)
Sodium: 140 mEq/L (ref 135–145)
Total Bilirubin: 0.4 mg/dL (ref 0.2–1.2)
Total Protein: 7.1 g/dL (ref 6.0–8.3)

## 2023-02-05 LAB — HEMOGLOBIN A1C: Hgb A1c MFr Bld: 6.3 % (ref 4.6–6.5)

## 2023-02-05 LAB — TSH: TSH: 0.92 u[IU]/mL (ref 0.35–5.50)

## 2023-02-24 ENCOUNTER — Ambulatory Visit (INDEPENDENT_AMBULATORY_CARE_PROVIDER_SITE_OTHER): Payer: BC Managed Care – PPO | Admitting: Family Medicine

## 2023-02-24 ENCOUNTER — Encounter: Payer: Self-pay | Admitting: Family Medicine

## 2023-02-24 VITALS — BP 112/80 | HR 60 | Temp 98.8°F | Ht 63.5 in | Wt 168.7 lb

## 2023-02-24 DIAGNOSIS — N939 Abnormal uterine and vaginal bleeding, unspecified: Secondary | ICD-10-CM

## 2023-02-24 NOTE — Progress Notes (Signed)
   Acute Office Visit  Subjective:     Patient ID: Lauren [name], female    DOB: 1966-01-30, 57 y.o.   MRN: [id number]  Chief Complaint  Patient presents with   Vaginal Bleeding    Patient complains of vaginal bleeding one time after sexual intercourse 3 weeks ago, denies pain     Vaginal Bleeding   Patient is in today for episode of bleeding after intercourse. Patient has had a full hysterectomy several years ago. Pt reports that it hurt somewhat when they changed positions. State sit was a small amount but looked like period blood, states that it resolved shortly after and has not had any bleeding since then. No fever or chills, no abdominal pain.   Review of Systems  Genitourinary:  Positive for vaginal bleeding.  All other systems reviewed and are negative.       Objective:    BP 112/80 (BP Location: Left Arm, Patient Position: Sitting, Cuff Size: Normal)   Pulse 60   Temp 98.8 F (37.1 C) (Oral)   Ht 5' 3.5" (1.613 m)   Wt 168 lb 11.2 oz (76.5 kg)   SpO2 98%   BMI 29.42 kg/m    Physical Exam Exam conducted with a chaperone present.  Genitourinary:    General: Normal vulva.     Exam position: Lithotomy position.     Pubic Area: No rash.      Labia:        Right: No rash, lesion or injury.        Left: No rash or lesion.      Vagina: Normal. No signs of injury. No bleeding.     Comments: Vaginal cuff is normal, no signs of bleeding or other abnormalities    No results found for any visits on 02/24/23.      Assessment & Plan:   Problem List Items Addressed This Visit   None Visit Diagnoses     Vaginal bleeding    -  Primary     Unclear etiology, her vaginal exam is normal. Pt is s/p hysterectomy, I advised that if the bleeding returns or if there is any pain or changes, then we will send her to GYN for an evaluation.   No orders of the defined types were placed in this encounter.  I spent 20 minutes with the patient today performing the vaginal  exam and advising her.  No follow-ups on file.  Karie Georges, MD

## 2023-02-26 ENCOUNTER — Ambulatory Visit
Admission: RE | Admit: 2023-02-26 | Discharge: 2023-02-26 | Disposition: A | Discharge status: Home / Self Care | Payer: BC Managed Care – PPO | Source: Ambulatory Visit | Attending: *Deleted | Admitting: *Deleted

## 2023-02-26 DIAGNOSIS — Z0000 Encounter for general adult medical examination without abnormal findings: Secondary | ICD-10-CM

## 2023-03-10 ENCOUNTER — Ambulatory Visit: Payer: BC Managed Care – PPO | Admitting: Pulmonary Disease

## 2023-03-14 ENCOUNTER — Encounter: Payer: Self-pay | Admitting: Pulmonary Disease

## 2023-03-17 ENCOUNTER — Telehealth: Payer: Self-pay | Admitting: Nurse Practitioner

## 2023-03-17 NOTE — Telephone Encounter (Signed)
Called pt. Took off recall she stated she didn't want to f/u

## 2023-04-25 ENCOUNTER — Telehealth: Payer: Self-pay | Admitting: Family Medicine

## 2023-04-25 NOTE — Telephone Encounter (Signed)
Chris from triage nurse called and stated pt was having left side pain that was worse when standing and also hurt when she pressed on it. His recommendation was to be seen within 4 hours. A 4pm slot was offered to pt with Dr. Salomon Fick, pt declined stating she was waiting on her plumber. I let her know we would send a msg back to the careteam with information.

## 2023-04-25 NOTE — Telephone Encounter (Signed)
I spoke with the patient and she stated she has been having left sided abdominal pain, x1 week. Patient denies any fever, diarrhea or emesis at this time. Patient was offered a visit today at [facility] but was declined at that time as stated in the message below. Patient was advised to see emergency care at ED or UC for any worsening symptoms.

## 2023-04-30 ENCOUNTER — Ambulatory Visit: Payer: BC Managed Care – PPO | Admitting: Family Medicine

## 2023-04-30 ENCOUNTER — Ambulatory Visit (INDEPENDENT_AMBULATORY_CARE_PROVIDER_SITE_OTHER): Payer: Commercial Managed Care - HMO | Admitting: Family Medicine

## 2023-04-30 ENCOUNTER — Encounter: Payer: Self-pay | Admitting: Family Medicine

## 2023-04-30 VITALS — BP 118/78 | HR 57 | Temp 98.7°F | Wt 161.0 lb

## 2023-04-30 DIAGNOSIS — R109 Unspecified abdominal pain: Secondary | ICD-10-CM | POA: Diagnosis not present

## 2023-04-30 DIAGNOSIS — R1032 Left lower quadrant pain: Secondary | ICD-10-CM

## 2023-04-30 DIAGNOSIS — M545 Low back pain, unspecified: Secondary | ICD-10-CM | POA: Diagnosis not present

## 2023-04-30 LAB — POC URINALSYSI DIPSTICK (AUTOMATED)
Bilirubin, UA: NEGATIVE
Blood, UA: NEGATIVE
Glucose, UA: NEGATIVE
Ketones, UA: NEGATIVE
Leukocytes, UA: NEGATIVE
Nitrite, UA: NEGATIVE
Protein, UA: NEGATIVE
Spec Grav, UA: 1.015 (ref 1.010–1.025)
Urobilinogen, UA: 0.2 E.U./dL
pH, UA: 5 (ref 5.0–8.0)

## 2023-04-30 MED ORDER — METAXALONE 800 MG PO TABS: 800.0000 mg | ORAL_TABLET | Freq: Four times a day (QID) | ORAL | 0 refills | Status: DC | PRN

## 2023-04-30 MED ORDER — METHYLPREDNISOLONE 4 MG PO TBPK
ORAL_TABLET | ORAL | 0 refills | Status: DC
Start: 2023-04-30 — End: 2023-09-15

## 2023-04-30 NOTE — Progress Notes (Signed)
   Subjective:    Patient ID: Lauren [name], female    DOB: 11-Jul-1966, 57 y.o.   MRN: [id number]  HPI Here for one week of intermittent sharp pains in the left lower back and left flank. No urinary symptoms. She can make the pain better or worse by moving certain ways. Ibuprofen 800 mg helps a little. She is S/P a left sided laminotomy at L3-4.    Review of Systems  Constitutional: Negative.   Respiratory: Negative.    Cardiovascular: Negative.   Genitourinary: Negative.   Musculoskeletal:  Positive for back pain.       Objective:   Physical Exam Constitutional:      Appearance: Normal appearance. She is not ill-appearing.  Cardiovascular:     Rate and Rhythm: Normal rate and regular rhythm.     Pulses: Normal pulses.     Heart sounds: Normal heart sounds.  Pulmonary:     Effort: Pulmonary effort is normal.     Breath sounds: Normal breath sounds.  Abdominal:     General: Abdomen is flat. Bowel sounds are normal. There is no distension.     Palpations: Abdomen is soft. There is no mass.     Tenderness: There is no abdominal tenderness. There is no right CVA tenderness, left CVA tenderness, guarding or rebound.     Hernia: No hernia is present.  Musculoskeletal:     Comments: Tender on the left lower back with some spasm. ROM is full to flexion and extension, but rotation causes pain  Neurological:     Mental Status: She is alert.           Assessment & Plan:  Left side low back pain. She will take a Medrol dose pack and Metaxalone 800 mg as needed. Recheck if needed . Gershon Crane, MD

## 2023-05-01 ENCOUNTER — Other Ambulatory Visit: Payer: Self-pay

## 2023-05-01 ENCOUNTER — Encounter (HOSPITAL_BASED_OUTPATIENT_CLINIC_OR_DEPARTMENT_OTHER): Payer: Self-pay | Admitting: Emergency Medicine

## 2023-05-01 ENCOUNTER — Emergency Department (HOSPITAL_BASED_OUTPATIENT_CLINIC_OR_DEPARTMENT_OTHER)
Admission: EM | Admit: 2023-05-01 | Discharge: 2023-05-01 | Disposition: A | Discharge status: Home / Self Care | Payer: Commercial Managed Care - HMO | Attending: Emergency Medicine | Admitting: Emergency Medicine

## 2023-05-01 DIAGNOSIS — Z7982 Long term (current) use of aspirin: Secondary | ICD-10-CM | POA: Insufficient documentation

## 2023-05-01 DIAGNOSIS — I10 Essential (primary) hypertension: Secondary | ICD-10-CM | POA: Diagnosis not present

## 2023-05-01 DIAGNOSIS — I471 Supraventricular tachycardia, unspecified: Secondary | ICD-10-CM | POA: Diagnosis not present

## 2023-05-01 DIAGNOSIS — Z79899 Other long term (current) drug therapy: Secondary | ICD-10-CM | POA: Insufficient documentation

## 2023-05-01 DIAGNOSIS — R030 Elevated blood-pressure reading, without diagnosis of hypertension: Secondary | ICD-10-CM

## 2023-05-01 DIAGNOSIS — R0602 Shortness of breath: Secondary | ICD-10-CM | POA: Diagnosis present

## 2023-05-01 LAB — CBC
HCT: 42.3 % (ref 36.0–46.0)
Hemoglobin: 13.9 g/dL (ref 12.0–15.0)
MCH: 30.3 pg (ref 26.0–34.0)
MCHC: 32.9 g/dL (ref 30.0–36.0)
MCV: 92.4 fL (ref 80.0–100.0)
Platelets: 315 10*3/uL (ref 150–400)
RBC: 4.58 MIL/uL (ref 3.87–5.11)
RDW: 13.2 % (ref 11.5–15.5)
WBC: 9.4 10*3/uL (ref 4.0–10.5)
nRBC: 0 % (ref 0.0–0.2)

## 2023-05-01 LAB — BASIC METABOLIC PANEL
Anion gap: 8 (ref 5–15)
BUN: 16 mg/dL (ref 6–20)
CO2: 24 mmol/L (ref 22–32)
Calcium: 9.7 mg/dL (ref 8.9–10.3)
Chloride: 107 mmol/L (ref 98–111)
Creatinine, Ser: 0.8 mg/dL (ref 0.44–1.00)
GFR, Estimated: >60 mL/min (ref >60)
Glucose, Bld: 120 mg/dL — ABNORMAL HIGH (ref 70–99)
Potassium: 3.8 mmol/L (ref 3.5–5.1)
Sodium: 139 mmol/L (ref 135–145)

## 2023-05-01 MED ORDER — ADENOSINE 6 MG/2ML IV SOLN
6.0000 mg | Freq: Once | INTRAVENOUS | Status: AC
  Administered 2023-05-01: 6 mg via INTRAVENOUS
  Filled 2023-05-01: qty 2

## 2023-05-01 NOTE — ED Provider Notes (Signed)
[facility] Provider Note   CSN: [id number] Arrival date & time: 05/01/23  1523     History  Chief Complaint  Patient presents with   Palpitations    Redell Bhandari is a 57 y.o. female.  Pt with hx svt, c/o onset palpitations/rapid heart beating this afternoon, at rest. Denies any recent chest pain or exertional chest pain or discomfort. No recent sob or unusual doe. Felt normal earlier today. Normal appetite. Normal po intake. No fever or chills. No recent change in meds and is compliant w home meds.  No extremity pain or swelling. No nvd. Hx svt and prior ablation for same, but indicates was 'not complete'. No other recent episodes of palpitations. No syncope.   The history is provided by the patient and medical records.  Shortness of Breath Associated symptoms: no abdominal pain, no chest pain, no cough, no fever, no headaches, no neck pain, no rash and no vomiting        Home Medications Prior to Admission medications   Medication Sig Start Date End Date Taking? Authorizing Provider  albuterol (PROAIR HFA) 108 (90 Base) MCG/ACT inhaler Inhale 1-2 puffs into the lungs every 6 (six) hours as needed for wheezing or shortness of breath. 12/17/21   Hoy Register, MD  aspirin EC 81 MG tablet Take 81 mg by mouth daily. Swallow whole.    [provider]  atorvastatin (LIPITOR) 20 MG tablet Take 20 mg by mouth daily.    [provider]  busPIRone (BUSPAR) 15 MG tablet Take 15 mg by mouth 2 (two) times daily. 05/08/22   [provider]  fluticasone (FLONASE) 50 MCG/ACT nasal spray Place 1 spray into both nostrils daily. 01/27/23   Karie Georges, MD  lamoTRIgine (LAMICTAL) 25 MG tablet Take 25 mg by mouth 2 (two) times daily. 05/18/22   [provider]  lisinopril (ZESTRIL) 20 MG tablet TAKE 1 TABLET (20 MG TOTAL) BY MOUTH DAILY. 02/01/22 02/01/23  Hoy Register, MD  metaxalone (SKELAXIN) 800 MG tablet  Take 1 tablet (800 mg total) by mouth every 6 (six) hours as needed for muscle spasms. 04/30/23   Nelwyn Salisbury, MD  methylPREDNISolone (MEDROL DOSEPAK) 4 MG TBPK tablet As directed 04/30/23   Nelwyn Salisbury, MD  metoprolol tartrate (LOPRESSOR) 25 MG tablet TAKE 1 TABLET BY MOUTH TWICE A DAY 01/20/23   Sheilah Pigeon, PA-C  Misc. Devices MISC Please provide patient with insurance approved supplies for her CPAP: tubing, chin strap and supplies and filters. 11/17/18   Claiborne Rigg, NP  Multiple Vitamin (MULTIVITAMIN) capsule Take 1 capsule by mouth daily.    [provider]  traZODone (DESYREL) 100 MG tablet Take 0.5-1 tablets (50-100 mg total) by mouth at bedtime. 01/08/22   Claiborne Rigg, NP  venlafaxine XR (EFFEXOR-XR) 150 MG 24 hr capsule Take 1 capsule (150 mg total) by mouth every morning. 02/01/22   Hoy Register, MD      Allergies    Iodinated contrast media    Review of Systems   Review of Systems  Constitutional:  Negative for chills and fever.  Eyes:  Negative for redness.  Respiratory:  Positive for shortness of breath. Negative for cough.   Cardiovascular:  Positive for palpitations. Negative for chest pain and leg swelling.  Gastrointestinal:  Negative for abdominal pain and vomiting.  Genitourinary:  Negative for flank pain.  Musculoskeletal:  Negative for back pain and neck pain.  Skin:  Negative for rash.  Neurological:  Negative for headaches.  Hematological:  Does not bruise/bleed easily.  Psychiatric/Behavioral:  Negative for confusion.     Physical Exam Updated Vital Signs BP (!) 131/105 (BP Location: Right Arm)   Pulse (!) 158   Temp 98.5 F (36.9 C) (Oral)   Resp 19   Ht 1.575 m (5\' 2" )   Wt 72.6 kg   SpO2 100%   BMI 29.26 kg/m  Physical Exam Vitals and nursing note reviewed.  Constitutional:      Appearance: Normal appearance. She is well-developed.  HENT:     Head: Atraumatic.     Nose: Nose normal.     Mouth/Throat:     Mouth:  Mucous membranes are moist.  Eyes:     General: No scleral icterus.    Conjunctiva/sclera: Conjunctivae normal.     Pupils: Pupils are equal, round, and reactive to light.  Neck:     Trachea: No tracheal deviation.     Comments: Thyroid not grossly enlarged or tender.  Cardiovascular:     Rate and Rhythm: Regular rhythm. Tachycardia present.     Pulses: Normal pulses.     Heart sounds: Normal heart sounds. No murmur heard.    No friction rub. No gallop.  Pulmonary:     Effort: Pulmonary effort is normal. No respiratory distress.     Breath sounds: Normal breath sounds.  Abdominal:     General: There is no distension.     Palpations: Abdomen is soft. There is no mass.     Tenderness: There is no abdominal tenderness.  Musculoskeletal:        General: No swelling or tenderness.     Cervical back: Normal range of motion and neck supple. No rigidity. No muscular tenderness.     Right lower leg: No edema.     Left lower leg: No edema.  Skin:    General: Skin is warm and dry.     Findings: No rash.  Neurological:     Mental Status: She is alert.     Comments: Alert, speech normal. Motor/sens grossly intact bil.   Psychiatric:        Mood and Affect: Mood normal.     ED Results / Procedures / Treatments   Labs (all labs ordered are listed, but only abnormal results are displayed) Results for orders placed or performed during the hospital encounter of 05/01/23  CBC  Result Value Ref Range   WBC 9.4 4.0 - 10.5 K/uL   RBC 4.58 3.87 - 5.11 MIL/uL   Hemoglobin 13.9 12.0 - 15.0 g/dL   HCT 29.5 62.1 - 30.8 %   MCV 92.4 80.0 - 100.0 fL   MCH 30.3 26.0 - 34.0 pg   MCHC 32.9 30.0 - 36.0 g/dL   RDW 65.7 84.6 - 96.2 %   Platelets 315 150 - 400 K/uL   nRBC 0.0 0.0 - 0.2 %   EKG EKG Interpretation Date/Time:  Thursday May 01 2023 15:48:46 EDT Ventricular Rate:  152 PR Interval:    QRS Duration:  94 QT Interval:  300 QTC Calculation: 477 R Axis:   74  Text  Interpretation: Narrow QRS tachycardia Non-specific ST-t changes Confirmed by Cathren Laine ([id number]) on 05/01/2023 4:14:49 PM  Radiology No results found.  Procedures Procedures    Medications Ordered in ED Medications  adenosine (ADENOCARD) 6 MG/2ML injection 6 mg (6 mg Intravenous Given 05/01/23 1627)    ED Course/ Medical Decision Making/ A&P  Medical Decision Making Problems Addressed: Elevated blood pressure reading: acute illness or injury Essential hypertension: chronic illness or injury with exacerbation, progression, or side effects of treatment that poses a threat to life or bodily functions SVT (supraventricular tachycardia): acute illness or injury with systemic symptoms that poses a threat to life or bodily functions  Amount and/or Complexity of Data Reviewed External Data Reviewed: notes. Labs: ordered. Decision-making details documented in ED Course. ECG/medicine tests: ordered and independent interpretation performed. Decision-making details documented in ED Course.  Risk Prescription drug management. Decision regarding hospitalization.   Iv ns. Continuous pulse ox and cardiac monitoring. Labs ordered/sent.   Differential diagnosis includes svt, afib, etc. Dispo decision including potential need for admission considered - will get labs and reassess.   Reviewed nursing notes and prior charts for additional history. External reports reviewed.   Cardiac monitor: narrow complex tachy, rate 150.   No change with vagal maneuvers.    Adenosine 6 mg rapid ivp. Conversion to NSR.  No symptoms. No chest pain or sob.   Labs reviewed/interpreted by me - wbc and hgb normal.   CRITICAL CARE RE: SVT with rapid ventricular response requiring parenteral chemical cardioversion Performed by: Suzi Roots Total critical care time: 35 minutes Critical care time was exclusive of separately billable procedures and treating other  patients. Critical care was necessary to treat or prevent imminent or life-threatening deterioration. Critical care was time spent personally by me on the following activities: development of treatment plan with patient and/or surrogate as well as nursing, discussions with consultants, evaluation of patient's response to treatment, examination of patient, obtaining history from patient or surrogate, ordering and performing treatments and interventions, ordering and review of laboratory studies, ordering and review of radiographic studies, pulse oximetry and re-evaluation of patient's condition.   Repeat ecg. Nsr. No cp or sob.   Pt asymptomatic and appears stable for d/c. Pt indicates already has close cardiology f/u already arranged.    Rec close pcp f/u.  Return precautions provided.          Final Clinical Impression(s) / ED Diagnoses Final diagnoses:  None    Rx / DC Orders ED Discharge Orders     None         Cathren Laine, MD 05/01/23 2350

## 2023-05-01 NOTE — ED Triage Notes (Signed)
Pt via pov from home with sob for last hour. Pt reports hx of svt and cardiac ablation in spt 2023. Pt reports that she began to feel her heart racing and sob around 1430 hours today. Pt denies n/v; endorses tightness in her chest and throat. Pt used her inhaler due to the tightness just prior to arrival. Pt alert & oriented, nad noted.

## 2023-05-01 NOTE — Discharge Instructions (Signed)
It was our pleasure to provide your ER care today - we hope that you feel better.  Continue your medications.   Follow up closely with your cardiologist as planned. Also follow up then regarding your high blood pressure.   Return to ER if worse, new symptoms, persistent fast heart beating, chest pain, trouble breathing, fainting, or other concern.

## 2023-05-02 ENCOUNTER — Encounter: Payer: Self-pay | Admitting: Nurse Practitioner

## 2023-05-02 ENCOUNTER — Ambulatory Visit: Payer: Commercial Managed Care - HMO | Admitting: Nurse Practitioner

## 2023-05-02 VITALS — BP 124/72 | HR 67 | Ht 62.0 in | Wt 164.4 lb

## 2023-05-02 DIAGNOSIS — R002 Palpitations: Secondary | ICD-10-CM

## 2023-05-02 DIAGNOSIS — I471 Supraventricular tachycardia, unspecified: Secondary | ICD-10-CM

## 2023-05-02 DIAGNOSIS — I10 Essential (primary) hypertension: Secondary | ICD-10-CM | POA: Diagnosis not present

## 2023-05-02 DIAGNOSIS — R931 Abnormal findings on diagnostic imaging of heart and coronary circulation: Secondary | ICD-10-CM | POA: Diagnosis not present

## 2023-05-02 DIAGNOSIS — R7303 Prediabetes: Secondary | ICD-10-CM

## 2023-05-02 DIAGNOSIS — Z720 Tobacco use: Secondary | ICD-10-CM

## 2023-05-02 DIAGNOSIS — G4733 Obstructive sleep apnea (adult) (pediatric): Secondary | ICD-10-CM

## 2023-05-02 MED ORDER — METOPROLOL TARTRATE 25 MG PO TABS: 37.5000 mg | ORAL_TABLET | Freq: Two times a day (BID) | ORAL | 2 refills | Status: DC

## 2023-05-02 NOTE — Patient Instructions (Addendum)
Medication Instructions:  Increase Metoprolol 37.5 mg twice daily.  *If you need a refill on your cardiac medications before your next appointment, please call your pharmacy*   Lab Work: NONE ordered at this time of appointment     Testing/Procedures: NONE ordered at this time of appointment     Follow-Up: At [facility], you and your health needs are our priority.  As part of our continuing mission to provide you with exceptional heart care, we have created designated Provider Care Teams.  These Care Teams include your primary Cardiologist (physician) and Advanced Practice Providers (APPs -  Physician Assistants and Nurse Practitioners) who all work together to provide you with the care you need, when you need it.  We recommend signing up for the patient portal called "MyChart".  Sign up information is provided on this After Visit Summary.  MyChart is used to connect with patients for Virtual Visits (Telemedicine).  Patients are able to view lab/test results, encounter notes, upcoming appointments, etc.  Non-urgent messages can be sent to your provider as well.   To learn more about what you can do with MyChart, go to [email or web address].    Your next appointment:   6 month(s) ([place]) EP appointment (Next Available)  Provider:   Reatha Harps, MD

## 2023-05-02 NOTE — Progress Notes (Signed)
Office Visit    Patient Name: Lauren [name] Date of Encounter: 05/02/2023  Primary Care Provider:  Karie Georges, MD Primary Cardiologist:  Reatha Harps, MD  Chief Complaint    57 year old female with a history of nonobstructive CAD, SVT s/p ablation, hypertension, prediabetes, GERD, OSA not on CPAP, and tobacco use who presents for follow-up related to SVT.  Past Medical History    Past Medical History:  Diagnosis Date   Anxiety    Arthritis    Depression    GERD (gastroesophageal reflux disease)    Hypertension    Prediabetes    Sleep apnea    Past hx - no cpap now    SVT (supraventricular tachycardia)    Past Surgical History:  Procedure Laterality Date   ABDOMINAL HYSTERECTOMY     BACK SURGERY     CESAREAN SECTION     COLONOSCOPY     HAND SURGERY     NECK SURGERY  2002   plate in her neck   SVT ABLATION N/A 06/28/2022   Procedure: SVT ABLATION;  Surgeon: Lanier Prude, MD;  Location: [facility];  Service: Cardiovascular;  Laterality: N/A;   TOTAL ABDOMINAL HYSTERECTOMY      Allergies  Allergies  Allergen Reactions   Iodinated Contrast Media Nausea Only     Labs/Other Studies Reviewed    The following studies were reviewed today:  Cardiac Studies & Procedures       ECHOCARDIOGRAM  ECHOCARDIOGRAM COMPLETE 04/08/2022  Narrative ECHOCARDIOGRAM REPORT    Patient Name:   Lauren [name] Date of Exam: 04/08/2022 Medical Rec #:  [id number]        Height:       62.0 in Accession #:    [id number]       Weight:       169.8 lb Date of Birth:  08/21/1966        BSA:          1.783 m Patient Age:    56 years         BP:           114/72 mmHg Patient Gender: F                HR:           50 bpm. Exam Location:  [address]  Procedure: 2D Echo, Cardiac Doppler, Color Doppler and Intracardiac Opacification Agent  Indications:    I47.1 SVT  History:        Patient has no prior history of Echocardiogram examinations. Risk  Factors:Hypertension, Dyslipidemia and Sleep Apnea. Prediabetes.  Sonographer:    Cathie Beams RCS Referring Phys: Ronnald Ramp O'NEAL  IMPRESSIONS   1. Left ventricular ejection fraction, by estimation, is 65 to 70%. The left ventricle has normal function. The left ventricle has no regional wall motion abnormalities. Left ventricular diastolic parameters were normal. 2. Right ventricular systolic function is normal. The right ventricular size is normal. There is normal pulmonary artery systolic pressure. The estimated right ventricular systolic pressure is 20.8 mmHg. 3. The mitral valve is normal in structure. Trivial mitral valve regurgitation. No evidence of mitral stenosis. 4. The aortic valve is grossly normal. Aortic valve regurgitation is not visualized. No aortic stenosis is present. 5. The inferior vena cava is normal in size with greater than 50% respiratory variability, suggesting right atrial pressure of 3 mmHg.  FINDINGS Left Ventricle: Left ventricular ejection fraction, by estimation, is 65 to 70%.  The left ventricle has normal function. The left ventricle has no regional wall motion abnormalities. The left ventricular internal cavity size was normal in size. There is no left ventricular hypertrophy. Left ventricular diastolic parameters were normal.  Right Ventricle: The right ventricular size is normal. No increase in right ventricular wall thickness. Right ventricular systolic function is normal. There is normal pulmonary artery systolic pressure. The tricuspid regurgitant velocity is 2.11 m/s, and with an assumed right atrial pressure of 3 mmHg, the estimated right ventricular systolic pressure is 20.8 mmHg.  Left Atrium: Left atrial size was normal in size.  Right Atrium: Right atrial size was normal in size.  Pericardium: There is no evidence of pericardial effusion.  Mitral Valve: The mitral valve is normal in structure. Trivial mitral valve regurgitation. No  evidence of mitral valve stenosis.  Tricuspid Valve: The tricuspid valve is normal in structure. Tricuspid valve regurgitation is mild . No evidence of tricuspid stenosis.  Aortic Valve: The aortic valve is grossly normal. Aortic valve regurgitation is not visualized. No aortic stenosis is present.  Pulmonic Valve: The pulmonic valve was grossly normal. Pulmonic valve regurgitation is mild. No evidence of pulmonic stenosis.  Aorta: The aortic root is normal in size and structure.  Venous: The inferior vena cava is normal in size with greater than 50% respiratory variability, suggesting right atrial pressure of 3 mmHg.  IAS/Shunts: The interatrial septum appears to be lipomatous. No atrial level shunt detected by color flow Doppler.   LEFT VENTRICLE PLAX 2D LVIDd:         3.70 cm   Diastology LVIDs:         2.00 cm   LV e' medial:    11.40 cm/s LV PW:         0.70 cm   LV E/e' medial:  10.0 LV IVS:        1.00 cm   LV e' lateral:   12.00 cm/s LVOT diam:     1.80 cm   LV E/e' lateral: 9.5 LV SV:         73 LV SV Index:   41 LVOT Area:     2.54 cm   RIGHT VENTRICLE RV Basal diam:  2.60 cm RV S prime:     11.50 cm/s TAPSE (M-mode): 2.4 cm RVSP:           20.8 mmHg  LEFT ATRIUM             Index        RIGHT ATRIUM           Index LA diam:        2.80 cm 1.57 cm/m   RA Pressure: 3.00 mmHg LA Vol (A2C):   21.8 ml 12.23 ml/m  RA Area:     11.70 cm LA Vol (A4C):   27.7 ml 15.53 ml/m  RA Volume:   24.90 ml  13.96 ml/m LA Biplane Vol: 24.8 ml 13.91 ml/m AORTIC VALVE LVOT Vmax:   131.00 cm/s LVOT Vmean:  79.800 cm/s LVOT VTI:    0.286 m  AORTA Ao Root diam: 2.50 cm Ao Asc diam:  2.80 cm  MITRAL VALVE                TRICUSPID VALVE MV Area (PHT): 3.10 cm     TR Peak grad:   17.8 mmHg MV Decel Time: 245 msec     TR Vmax:        211.00 cm/s MV E velocity:  114.00 cm/s  Estimated RAP:  3.00 mmHg MV A velocity: 106.00 cm/s  RVSP:           20.8 mmHg MV E/A ratio:   1.08 SHUNTS Systemic VTI:  0.29 m Systemic Diam: 1.80 cm  Weston Brass MD Electronically signed by Weston Brass MD Signature Date/Time: 04/08/2022/10:57:45 AM    Final     CT SCANS  CT CORONARY MORPH W/CTA COR W/SCORE 04/26/2022  Addendum 04/26/2022  3:31 PM ADDENDUM REPORT: 04/26/2022 15:29  CLINICAL DATA:  Chest pain  EXAM: Cardiac/Coronary CTA  TECHNIQUE: A non-contrast, gated CT scan was obtained with axial slices of 3 mm through the heart for calcium scoring. Calcium scoring was performed using the Agatston method. A 120 kV prospective, gated, contrast cardiac scan was obtained. Gantry rotation speed was 250 msecs and collimation was 0.6 mm. Two sublingual nitroglycerin tablets (0.8 mg) were given. The 3D data set was reconstructed in 5% intervals of the 35-75% of the R-R cycle. Diastolic phases were analyzed on a dedicated workstation using MPR, MIP, and VRT modes. The patient received 95 cc of contrast.  FINDINGS: Image quality: Excellent.  Noise artifact is: Limited.  Coronary Arteries:  Normal coronary origin.  Right dominance.  Left main: The left main is a large caliber vessel with a normal take off from the left coronary cusp that bifurcates to form a left anterior descending artery and a left circumflex artery. There is no plaque or stenosis.  Left anterior descending artery: The mid LAD contains minimal calcified plaque (<25%). The LAD gives off 2 patent diagonal branches.  Left circumflex artery: The LCX is non-dominant. There is minimal non-calcified plaque (<25%) in the LCX. The LCX gives off 1 patent obtuse marginal branch.  Right coronary artery: The RCA is dominant with normal take off from the right coronary cusp. There is no evidence of plaque or stenosis. The RCA terminates as a PDA and right posterolateral branch without evidence of plaque or stenosis.  Right Atrium: Right atrial size is within normal limits.  Right Ventricle:  The right ventricular cavity is within normal limits.  Left Atrium: Left atrial size is normal in size with no left atrial appendage filling defect. Lipomatous interatrial septum.  Left Ventricle: The ventricular cavity size is within normal limits.  Pulmonary arteries: Normal in size without proximal filling defect.  Pulmonary veins: Normal pulmonary venous drainage.  Pericardium: Normal thickness without significant effusion or calcium present.  Cardiac valves: The aortic valve is trileaflet without significant calcification. The mitral valve is normal without significant calcification.  Aorta: Normal caliber without significant disease.  Extra-cardiac findings: See attached radiology report for non-cardiac structures.  IMPRESSION: 1. Coronary calcium score of 1.1. This was 78th percentile for age-, sex, and race-matched controls. Plaque volume 18 mm3.  2. Normal coronary origin with right dominance.  3. Minimal calcified plaque (<25%) in the mid LAD.  4. Minimal non-calcified plaque (<25%) in the LCX.  RECOMMENDATIONS: 1. Minimal non-obstructive CAD (0-24%). Consider non-atherosclerotic causes of chest pain. Consider preventive therapy and risk factor modification.  Lennie Odor, MD   Electronically Signed By: Lennie Odor M.D. On: 04/26/2022 15:29  Narrative EXAM: OVER-READ INTERPRETATION  CT CHEST  The following report is an over-read performed by radiologist Dr. Carey Bullocks of [facility] on 04/26/2022. This over-read does not include interpretation of cardiac or coronary anatomy or pathology. The coronary CTA interpretation by the cardiologist is attached.  COMPARISON:  Chest CT 09/22/2016.  FINDINGS: Mediastinum/Nodes: There are  no enlarged lymph nodes within the visualized mediastinum.Small hilar lymph nodes appear unchanged.  Lungs/Pleura: There is no pleural effusion. Mild dependent atelectasis at both lung bases. No confluent  airspace opacity or suspicious nodule.  Upper abdomen: The visualized upper abdomen appears stable without significant findings.  Musculoskeletal/Chest wall: No chest wall mass or suspicious osseous findings within the visualized chest. Mild thoracic spondylosis.  IMPRESSION: No significant extracardiac findings within the visualized lower chest.  Electronically Signed: By: Carey Bullocks M.D. On: 04/26/2022 11:43         Recent Labs: 02/05/2023: ALT 22; TSH 0.92 05/01/2023: BUN 16; Creatinine, Ser 0.80; Hemoglobin 13.9; Platelets 315; Potassium 3.8; Sodium 139  Recent Lipid Panel    Component Value Date/Time   CHOL 127 02/05/2023 1259   CHOL 151 07/11/2022 1001   TRIG 82.0 02/05/2023 1259   HDL 52.50 02/05/2023 1259   HDL 43 07/11/2022 1001   CHOLHDL 2 02/05/2023 1259   VLDL 16.4 02/05/2023 1259   LDLCALC 59 02/05/2023 1259   LDLCALC 91 07/11/2022 1001    History of Present Illness    57 year old female with the above past medical history including nonobstructive CAD, SVT s/p ablation, hypertension, prediabetes, GERD, OSA not on CPAP, and tobacco use.  Echocardiogram in 03/2022 showed EF 65 to 70%, normal LV function, no RWMA, normal RV, no significant valvular abnormalities.  Coronary CT angiogram 04/2022 showed coronary calcium score 1.1 (78th percentile), minimal nonobstructive CAD.  She has a history of SVT.  She underwent SVT ablation with Dr. Lalla Brothers in 06/2022.  She was last seen in the office on 08/22/2022 by EP and was doing well.  She did report increased palpitations following reduction in metoprolol, not similar to SVT.  Metoprolol was increased to 25 mg twice daily.  She presented to the ED on 05/01/2023 in the setting of palpitations, rapid heart rate.  Cardiac monitor revealed narrow complex tachycardia, HR 150 bpm, no acute change with vagal maneuvers.  She was given adenosine with conversion to sinus rhythm.  CBC and BMET were unremarkable.  She was discharged home  in stable condition.  She presents today for follow-up.  Since her last visit and since her recent ED visit she has been stable from a cardiac standpoint.  She denies any further palpitations, denies dizziness, presyncope, syncope, denies symptoms concerning for angina.  She notes that she was prescribed prednisone for her back pain but does not wish to take this as she is afraid that it will cause her to have elevated heart rate.  Overall, she reports feeling well.  Home Medications    Current Outpatient Medications  Medication Sig Dispense Refill   albuterol (PROAIR HFA) 108 (90 Base) MCG/ACT inhaler Inhale 1-2 puffs into the lungs every 6 (six) hours as needed for wheezing or shortness of breath. 8.5 g 2   aspirin EC 81 MG tablet Take 81 mg by mouth daily. Swallow whole.     atorvastatin (LIPITOR) 20 MG tablet Take 20 mg by mouth daily.     busPIRone (BUSPAR) 15 MG tablet Take 15 mg by mouth 2 (two) times daily.     fluticasone (FLONASE) 50 MCG/ACT nasal spray Place 1 spray into both nostrils daily. 16 g 2   lamoTRIgine (LAMICTAL) 25 MG tablet Take 25 mg by mouth 2 (two) times daily.     metaxalone (SKELAXIN) 800 MG tablet Take 1 tablet (800 mg total) by mouth every 6 (six) hours as needed for muscle spasms. 60 tablet 0  methylPREDNISolone (MEDROL DOSEPAK) 4 MG TBPK tablet As directed 21 tablet 0   Misc. Devices MISC Please provide patient with insurance approved supplies for her CPAP: tubing, chin strap and supplies and filters. 1 each 0   Multiple Vitamin (MULTIVITAMIN) capsule Take 1 capsule by mouth daily.     traZODone (DESYREL) 100 MG tablet Take 0.5-1 tablets (50-100 mg total) by mouth at bedtime. 90 tablet 1   venlafaxine XR (EFFEXOR-XR) 150 MG 24 hr capsule Take 1 capsule (150 mg total) by mouth every morning. 30 capsule 1   lisinopril (ZESTRIL) 20 MG tablet TAKE 1 TABLET (20 MG TOTAL) BY MOUTH DAILY. 30 tablet 1   metoprolol tartrate (LOPRESSOR) 25 MG tablet Take 1.5 tablets (37.5  mg total) by mouth 2 (two) times daily. 180 tablet 2   No current facility-administered medications for this visit.     Review of Systems    She denies chest pain, palpitations, dyspnea, pnd, orthopnea, n, v, dizziness, syncope, edema, weight gain, or early satiety. All other systems reviewed and are otherwise negative except as noted above.   Physical Exam    VS:  BP 124/72   Pulse 67   Ht 5\' 2"  (1.575 m)   Wt 164 lb 6.4 oz (74.6 kg)   SpO2 99%   BMI 30.07 kg/m  GEN: Well nourished, well developed, in no acute distress. HEENT: normal. Neck: Supple, no JVD, carotid bruits, or masses. Cardiac: RRR, no murmurs, rubs, or gallops. No clubbing, cyanosis, edema.  Radials/DP/PT 2+ and equal bilaterally.  Respiratory:  Respirations regular and unlabored, clear to auscultation bilaterally. GI: Soft, nontender, nondistended, BS + x 4. MS: no deformity or atrophy. Skin: warm and dry, no rash. Neuro:  Strength and sensation are intact. Psych: Normal affect.  Accessory Clinical Findings    ECG personally reviewed by me today -    - no EKG in office today.   Lab Results  Component Value Date   WBC 9.4 05/01/2023   HGB 13.9 05/01/2023   HCT 42.3 05/01/2023   MCV 92.4 05/01/2023   PLT 315 05/01/2023   Lab Results  Component Value Date   CREATININE 0.80 05/01/2023   BUN 16 05/01/2023   NA 139 05/01/2023   K 3.8 05/01/2023   CL 107 05/01/2023   CO2 24 05/01/2023   Lab Results  Component Value Date   ALT 22 02/05/2023   AST 21 02/05/2023   ALKPHOS 75 02/05/2023   BILITOT 0.4 02/05/2023   Lab Results  Component Value Date   CHOL 127 02/05/2023   HDL 52.50 02/05/2023   LDLCALC 59 02/05/2023   TRIG 82.0 02/05/2023   CHOLHDL 2 02/05/2023    Lab Results  Component Value Date   HGBA1C 6.3 02/05/2023    Assessment & Plan   1. SVT: S/p ablation in 06/2022.  Echo in 03/2022 showed EF 65 to 70%, normal LV function, no RWMA, normal RV, no significant valvular abnormalities.   Recent ED visit in the setting of recurrent SVT, converted to sinus rhythm with adenosine.  She denies any further palpitations.  Will increase metoprolol to 37.5 mg twice daily.  Continue to monitor BP/HR. Reviewed ED precautions. Recommend follow-up with EP.    2. Nonobstructive CAD: Coronary CT angiogram 04/2022 showed coronary calcium score 1.1 (78th percentile), minimal nonobstructive CAD. Stable with no anginal symptoms. No indication for ischemic evaluation.   3. Hypertension: BP well controlled. Continue current antihypertensive regimen.   4. Prediabetes: A1c was 6.3 in 01/2023.  Monitored and managed per PCP.  5. OSA: No longer on CPAP.   6. Tobacco use: She continues to smoke. Full cessation advised.   7. Disposition: Follow-up with EP, follow-up in 4 to 6 months with Dr. Flora Lipps.      Joylene Grapes, NP 05/02/2023, 2:55 PM

## 2023-05-13 ENCOUNTER — Telehealth: Payer: Self-pay | Admitting: Cardiovascular Disease

## 2023-05-13 NOTE — Telephone Encounter (Signed)
Spoke to the patient, had on OV 7/12 Lopressor 25 mg was sent to the pharmacy. Pt stated she went to fill the medication on the same day, was advised it was too early to fill the medication. Pt stated she returned to the pharmacy on 7/15, was advised " it too early to fill the medication". Advised pt to call the pharmacy this morning and if she has any difficulty with refilling the medication, to call our office. Pt voiced understanding.

## 2023-05-13 NOTE — Telephone Encounter (Signed)
Pt c/o medication issue:  1. Name of Medication: metoprolol tartrate (LOPRESSOR) 25 MG tablet   2. How are you currently taking this medication (dosage and times per day)?   3. Are you having a reaction (difficulty breathing--STAT)?   4. What is your medication issue?  Patient states that the pharmacy is telling her that it is too soon to pick this medication up, but she states she is out. Requesting this be called in again to push it through.

## 2023-05-14 NOTE — Telephone Encounter (Signed)
Called patient advised she would need to pay out of pocket if it is too soon.  States she can pick up on 8/1.  Offered to send a week supply for her to pay out of pocket or she can get the whole 30 day.  She will call pharmacy and see the cost first.

## 2023-05-14 NOTE — Telephone Encounter (Signed)
Patient calling to say that the pharmacy is still saying its too earlier. Calling to see what other options their are. Please advise

## 2023-06-05 ENCOUNTER — Encounter (INDEPENDENT_AMBULATORY_CARE_PROVIDER_SITE_OTHER): Payer: Self-pay

## 2023-07-04 ENCOUNTER — Ambulatory Visit: Payer: Self-pay | Admitting: Family Medicine

## 2023-07-05 ENCOUNTER — Other Ambulatory Visit: Payer: Self-pay | Admitting: Cardiovascular Disease

## 2023-07-30 ENCOUNTER — Encounter: Payer: Self-pay | Admitting: Family Medicine

## 2023-09-10 ENCOUNTER — Ambulatory Visit (HOSPITAL_COMMUNITY): Payer: 59

## 2023-09-15 ENCOUNTER — Ambulatory Visit (INDEPENDENT_AMBULATORY_CARE_PROVIDER_SITE_OTHER): Payer: 59 | Admitting: Family Medicine

## 2023-09-15 ENCOUNTER — Encounter: Payer: Self-pay | Admitting: Family Medicine

## 2023-09-15 VITALS — BP 132/80 | HR 65 | Temp 98.0°F | Wt 155.0 lb

## 2023-09-15 DIAGNOSIS — Z0000 Encounter for general adult medical examination without abnormal findings: Secondary | ICD-10-CM

## 2023-09-15 DIAGNOSIS — E782 Mixed hyperlipidemia: Secondary | ICD-10-CM

## 2023-09-15 DIAGNOSIS — R7303 Prediabetes: Secondary | ICD-10-CM | POA: Diagnosis not present

## 2023-09-15 LAB — COMPREHENSIVE METABOLIC PANEL
ALT: 20 U/L (ref 0–35)
AST: 22 U/L (ref 0–37)
Albumin: 4.2 g/dL (ref 3.5–5.2)
Alkaline Phosphatase: 80 U/L (ref 39–117)
BUN: 15 mg/dL (ref 6–23)
CO2: 27 meq/L (ref 19–32)
Calcium: 9.7 mg/dL (ref 8.4–10.5)
Chloride: 101 meq/L (ref 96–112)
Creatinine, Ser: 0.81 mg/dL (ref 0.40–1.20)
GFR: 80.48 mL/min (ref >60.00)
Glucose, Bld: 95 mg/dL (ref 70–99)
Potassium: 3.6 meq/L (ref 3.5–5.1)
Sodium: 137 meq/L (ref 135–145)
Total Bilirubin: 0.6 mg/dL (ref 0.2–1.2)
Total Protein: 7.2 g/dL (ref 6.0–8.3)

## 2023-09-15 LAB — LIPID PANEL
Cholesterol: 137 mg/dL (ref 0–200)
HDL: 56.7 mg/dL (ref >39.00)
LDL Cholesterol: 62 mg/dL (ref 0–99)
NonHDL: 80.13
Total CHOL/HDL Ratio: 2
Triglycerides: 91 mg/dL (ref 0.0–149.0)
VLDL: 18.2 mg/dL (ref 0.0–40.0)

## 2023-09-15 LAB — HEMOGLOBIN A1C: Hgb A1c MFr Bld: 6.1 % (ref 4.6–6.5)

## 2023-09-15 NOTE — Patient Instructions (Signed)

## 2023-09-15 NOTE — Progress Notes (Signed)
Complete physical exam  Patient: Lauren [name]   DOB: December 03, 1965   57 y.o. Female  MRN: [id number]  Subjective:    Chief Complaint  Patient presents with   Annual Exam    Lauren [name] is a 57 y.o. female who presents today for a complete physical exam. She reports consuming a general diet. The patient has a physically strenuous job, but has no regular exercise apart from work.  She generally feels well. She reports sleeping is having trouble both falling asleep and staying asleep. Going on for a couple of years. Takes trazodone often She does not have additional problems to discuss today.    Most recent fall risk assessment:    04/30/2023   10:39 AM  Fall Risk   Falls in the past year? 0  Number falls in past yr: 0  Injury with Fall? 0  Risk for fall due to : No Fall Risks  Follow up Falls evaluation completed     Most recent depression screenings:    09/15/2023   12:56 PM 04/30/2023   10:39 AM  PHQ 2/9 Scores  PHQ - 2 Score 0 0  PHQ- 9 Score 0 0    Vision:sees [facility] yearly for her eye exam  and Dental: No current dental problems and Receives regular dental care  Patient Active Problem List   Diagnosis Date Noted   Chronic cough 01/27/2023   Prediabetes 01/27/2023   Obesity (BMI 30-39.9) 06/21/2022   Paroxysmal SVT (supraventricular tachycardia) (HCC) 06/21/2022   Recurrent major depressive disorder, remission status unspecified (HCC) 12/17/2021   Left lumbar radiculitis 04/19/2021   Radiculopathy of cervical spine 08/25/2018   Pain in right hand 07/28/2018   Trigger finger, right ring finger 07/28/2018   Mixed hyperlipidemia 06/19/2017   Gastroesophageal reflux disease 04/15/2017   OSA (obstructive sleep apnea) 04/15/2017   Depression 11/28/2016      Patient Care Team: Karie Georges, MD as PCP - General (Family Medicine) Lanier Prude, MD as PCP - Electrophysiology (Cardiology) O'Neal, Ronnald Ramp, MD as PCP - Cardiology  (Cardiology)   Outpatient Medications Prior to Visit  Medication Sig   albuterol (PROAIR HFA) 108 (90 Base) MCG/ACT inhaler Inhale 1-2 puffs into the lungs every 6 (six) hours as needed for wheezing or shortness of breath.   aspirin EC 81 MG tablet Take 81 mg by mouth daily. Swallow whole.   atorvastatin (LIPITOR) 20 MG tablet TAKE 1 TABLET BY MOUTH EVERY DAY   busPIRone (BUSPAR) 15 MG tablet Take 15 mg by mouth 2 (two) times daily.   fluticasone (FLONASE) 50 MCG/ACT nasal spray Place 1 spray into both nostrils daily.   lamoTRIgine (LAMICTAL) 25 MG tablet Take 25 mg by mouth 2 (two) times daily.   metoprolol tartrate (LOPRESSOR) 25 MG tablet Take 1.5 tablets (37.5 mg total) by mouth 2 (two) times daily.   Misc. Devices MISC Please provide patient with insurance approved supplies for her CPAP: tubing, chin strap and supplies and filters.   Multiple Vitamin (MULTIVITAMIN) capsule Take 1 capsule by mouth daily.   traZODone (DESYREL) 100 MG tablet Take 0.5-1 tablets (50-100 mg total) by mouth at bedtime.   venlafaxine XR (EFFEXOR-XR) 150 MG 24 hr capsule Take 1 capsule (150 mg total) by mouth every morning.   lisinopril (ZESTRIL) 20 MG tablet TAKE 1 TABLET (20 MG TOTAL) BY MOUTH DAILY.   [DISCONTINUED] metaxalone (SKELAXIN) 800 MG tablet Take 1 tablet (800 mg total) by mouth every 6 (six) hours  as needed for muscle spasms.   [DISCONTINUED] methylPREDNISolone (MEDROL DOSEPAK) 4 MG TBPK tablet As directed   No facility-administered medications prior to visit.    Review of Systems  HENT:  Negative for hearing loss.   Eyes:  Negative for blurred vision.  Respiratory:  Negative for shortness of breath.   Cardiovascular:  Negative for chest pain.  Gastrointestinal: Negative.   Genitourinary: Negative.   Musculoskeletal:  Negative for back pain.  Neurological:  Negative for headaches.  Psychiatric/Behavioral:  Negative for depression.   All other systems reviewed and are negative.       Objective:     BP 132/80 (BP Location: Right Arm, Patient Position: Sitting, Cuff Size: Normal)   Pulse 65   Temp 98 F (36.7 C) (Oral)   Wt 155 lb (70.3 kg)   SpO2 99%   BMI 28.35 kg/m    Physical Exam Vitals reviewed.  Constitutional:      Appearance: Normal appearance. She is well-groomed and normal weight.  HENT:     Right Ear: Tympanic membrane and ear canal normal.     Left Ear: Tympanic membrane and ear canal normal.     Mouth/Throat:     Mouth: Mucous membranes are moist.     Pharynx: No posterior oropharyngeal erythema.  Eyes:     Conjunctiva/sclera: Conjunctivae normal.  Neck:     Thyroid: No thyromegaly.  Cardiovascular:     Rate and Rhythm: Normal rate and regular rhythm.     Pulses: Normal pulses.     Heart sounds: S1 normal and S2 normal.  Pulmonary:     Effort: Pulmonary effort is normal.     Breath sounds: Normal breath sounds and air entry.  Abdominal:     General: Abdomen is flat. Bowel sounds are normal.     Palpations: Abdomen is soft.  Musculoskeletal:     Right lower leg: No edema.     Left lower leg: No edema.  Lymphadenopathy:     Cervical: No cervical adenopathy.  Neurological:     Mental Status: She is alert and oriented to person, place, and time. Mental status is at baseline.     Gait: Gait is intact.  Psychiatric:        Mood and Affect: Mood and affect normal.        Speech: Speech normal.        Behavior: Behavior normal.        Judgment: Judgment normal.      No results found for any visits on 09/15/23.     Assessment & Plan:    Routine Health Maintenance and Physical Exam  Immunization History  Administered Date(s) Administered   Tdap 06/14/2019    Health Maintenance  Topic Date Due   MAMMOGRAM  02/25/2025   Colonoscopy  05/03/2026   DTaP/Tdap/Td (2 - Td or Tdap) 06/13/2029   Hepatitis C Screening  Completed   HIV Screening  Completed   HPV VACCINES  Aged Out   INFLUENZA VACCINE  Discontinued   COVID-19  Vaccine  Discontinued   Zoster Vaccines- Shingrix  Discontinued    Discussed health benefits of physical activity, and encouraged her to engage in regular exercise appropriate for her age and condition.  Mixed hyperlipidemia -     Lipid panel; Future  Prediabetes -     Comprehensive metabolic panel -     Hemoglobin A1c  Routine general medical examination at a health care facility  Normal physical exam findings. We discussed smoking  cessation however pt states she is not ready. Counseled patient on reducing overall cigarette use. Handouts given on healthy eating and exercise. Labs ordered for annual surveillance.   Return in 6 months (on 03/14/2024).     Karie Georges, MD

## 2023-09-20 ENCOUNTER — Other Ambulatory Visit: Payer: Self-pay | Admitting: Nurse Practitioner

## 2023-10-31 ENCOUNTER — Other Ambulatory Visit: Payer: Self-pay | Admitting: Family Medicine

## 2023-10-31 DIAGNOSIS — I10 Essential (primary) hypertension: Secondary | ICD-10-CM

## 2023-10-31 NOTE — Telephone Encounter (Signed)
 Copied from CRM [id number]. Topic: Clinical - Medication Refill >> Oct 31, 2023  2:15 PM Franky GRADE wrote: Most Recent Primary Care Visit:  Provider: OZELL HERON HERO  Department: [facility]  Visit Type: PHYSICAL  Date: 09/15/2023  Medication: lisinopril  (ZESTRIL ) 20 MG tablet  Has the patient contacted their pharmacy? Yes (Agent: If no, request that the patient contact the pharmacy for the refill. If patient does not wish to contact the pharmacy document the reason why and proceed with request.) (Agent: If yes, when and what did the pharmacy advise?)  Is this the correct pharmacy for this prescription? Yes If no, delete pharmacy and type the correct one.  This is the patient's preferred pharmacy:  [id number] - [place], [place] - [address] AT [facility] [address] [place] [address] Phone: [phone] Fax: [phone]   Has the prescription been filled recently? No  Is the patient out of the medication? Yes  Has the patient been seen for an appointment in the last year OR does the patient have an upcoming appointment? Yes  Can we respond through MyChart? Yes  Agent: Please be advised that Rx refills may take up to 3 business days. We ask that you follow-up with your pharmacy.

## 2023-11-03 MED ORDER — LISINOPRIL 20 MG PO TABS
ORAL_TABLET | Freq: Every day | ORAL | 1 refills | Status: DC
Start: 2023-11-03 — End: 2024-03-16

## 2023-11-06 ENCOUNTER — Telehealth: Payer: Self-pay | Admitting: *Deleted

## 2023-11-06 NOTE — Telephone Encounter (Signed)
Spoke with the patient for more information.  Patient stated she has a rash that she knows is shingles, had the same thing last month and was told to come back in for a shingles shot.  I advised the patient I do not see a visit noted here from last month and offered an appt tomorrow with PCP for evaluation of the rash.  Patient declined as she stated she has to work from 7-4.  Patient was advised and agreed to go to the nearest urgent care for evaluation of the rash.

## 2023-11-06 NOTE — Telephone Encounter (Signed)
Copied from CRM [id number]. Topic: Clinical - Medical Advice >> Nov 06, 2023 12:14 PM Lauren [name] wrote: Reason for CRM: Patient called in stating that she has shingles again, want to know if she needs to come in for the shot earlier before her appointment that was schedule

## 2023-12-09 ENCOUNTER — Ambulatory Visit: Payer: 59

## 2023-12-22 ENCOUNTER — Ambulatory Visit
Admission: EM | Admit: 2023-12-22 | Discharge: 2023-12-22 | Disposition: A | Discharge status: Home / Self Care | Attending: Family Medicine | Admitting: Family Medicine

## 2023-12-22 ENCOUNTER — Other Ambulatory Visit: Payer: Self-pay

## 2023-12-22 DIAGNOSIS — R21 Rash and other nonspecific skin eruption: Secondary | ICD-10-CM

## 2023-12-22 DIAGNOSIS — T7840XA Allergy, unspecified, initial encounter: Secondary | ICD-10-CM

## 2023-12-22 MED ORDER — TRIAMCINOLONE ACETONIDE 0.1 % EX CREA: 1.0000 | TOPICAL_CREAM | Freq: Two times a day (BID) | CUTANEOUS | 0 refills | Status: AC

## 2023-12-22 NOTE — ED Provider Notes (Signed)
 [name]    CSN: [id number] Arrival date & time: 12/22/23  1020      History   Chief Complaint Chief Complaint  Patient presents with   Rash    HPI Lauren [name] is a 58 y.o. female.    Rash Associated symptoms: no diarrhea, no fever, no nausea, no shortness of breath, no sore throat, not vomiting and not wheezing   Itchy rash for several weeks affects her trunk and extremities.  Suspects may be related to laundry detergent.  Has a sensitivity to [employer] detergent.  Boyfriend uses that detergent.  Has tried topical Benadryl without relief.  Pain in her house for 7 months.  Denies household contacts with similar rash.  Does not have any pets, denies new plant exposures, no history of eczema  Past Medical History:  Diagnosis Date   Anxiety    Arthritis    Depression    GERD (gastroesophageal reflux disease)    Hypertension    Prediabetes    Sleep apnea    Past hx - no cpap now    SVT (supraventricular tachycardia) (HCC)     Patient Active Problem List   Diagnosis Date Noted   Chronic cough 01/27/2023   Prediabetes 01/27/2023   Obesity (BMI 30-39.9) 06/21/2022   Paroxysmal SVT (supraventricular tachycardia) (HCC) 06/21/2022   Recurrent major depressive disorder, remission status unspecified (HCC) 12/17/2021   Left lumbar radiculitis 04/19/2021   Radiculopathy of cervical spine 08/25/2018   Pain in right hand 07/28/2018   Trigger finger, right ring finger 07/28/2018   Mixed hyperlipidemia 06/19/2017   Gastroesophageal reflux disease 04/15/2017   OSA (obstructive sleep apnea) 04/15/2017   Depression 11/28/2016    Past Surgical History:  Procedure Laterality Date   ABDOMINAL HYSTERECTOMY     BACK SURGERY     CESAREAN SECTION     COLONOSCOPY     HAND SURGERY     NECK SURGERY  2002   plate in her neck   SVT ABLATION N/A 06/28/2022   Procedure: SVT ABLATION;  Surgeon: Lanier Prude, MD;  Location: [facility];  Service:  Cardiovascular;  Laterality: N/A;   TOTAL ABDOMINAL HYSTERECTOMY      OB History     Gravida  1   Para      Term      Preterm      AB      Living  1      SAB      IAB      Ectopic      Multiple      Live Births  1            Home Medications    Prior to Admission medications   Medication Sig Start Date End Date Taking? Authorizing Provider  triamcinolone cream (KENALOG) 0.1 % Apply 1 Application topically 2 (two) times daily for 7 days. 12/22/23 12/29/23 Yes Meliton Rattan, PA  albuterol (PROAIR HFA) 108 (90 Base) MCG/ACT inhaler Inhale 1-2 puffs into the lungs every 6 (six) hours as needed for wheezing or shortness of breath. 12/17/21   Hoy Register, MD  aspirin EC 81 MG tablet Take 81 mg by mouth daily. Swallow whole.    [provider]  atorvastatin (LIPITOR) 20 MG tablet TAKE 1 TABLET BY MOUTH EVERY DAY 07/07/23   O'Neal, Ronnald Ramp, MD  busPIRone (BUSPAR) 15 MG tablet Take 15 mg by mouth 2 (two) times daily. 05/08/22   [provider]  fluticasone (FLONASE) 50 MCG/ACT nasal spray Place 1 spray into both nostrils daily. 01/27/23   Karie Georges, MD  lamoTRIgine (LAMICTAL) 25 MG tablet Take 25 mg by mouth 2 (two) times daily. 05/18/22   [provider]  lisinopril (ZESTRIL) 20 MG tablet TAKE 1 TABLET (20 MG TOTAL) BY MOUTH DAILY. 11/03/23 11/02/24  Karie Georges, MD  metoprolol tartrate (LOPRESSOR) 25 MG tablet TAKE 1.5 TABLETS (37.5 MG TOTAL) BY MOUTH TWICE A DAY 09/24/23   Joylene Grapes, NP  Misc. Devices MISC Please provide patient with insurance approved supplies for her CPAP: tubing, chin strap and supplies and filters. 11/17/18   Claiborne Rigg, NP  Multiple Vitamin (MULTIVITAMIN) capsule Take 1 capsule by mouth daily.    [provider]  traZODone (DESYREL) 100 MG tablet Take 0.5-1 tablets (50-100 mg total) by mouth at bedtime. 01/08/22   Claiborne Rigg, NP  venlafaxine XR (EFFEXOR-XR) 150 MG 24 hr capsule Take  1 capsule (150 mg total) by mouth every morning. 02/01/22   Hoy Register, MD    Family History Family History  Problem Relation Age of Onset   Healthy Mother    Heart attack Father    Diabetes Father    Heart attack Sister    Colon cancer Neg Hx    Colon polyps Neg Hx    Esophageal cancer Neg Hx    Rectal cancer Neg Hx    Stomach cancer Neg Hx     Social History Social History   Tobacco Use   Smoking status: Every Day    Current packs/day: 0.50    Average packs/day: 0.5 packs/day for 35.0 years (17.5 ttl pk-yrs)    Types: Cigarettes    Passive exposure: Current   Smokeless tobacco: Never   Tobacco comments:    Smokes less than 1/2 pack per day 09/25/22 pap  Vaping Use   Vaping status: Never Used  Substance Use Topics   Alcohol use: No    Comment: occasional   Drug use: No     Allergies   Iodinated contrast media   Review of Systems Review of Systems  Constitutional:  Negative for chills and fever.  HENT:  Negative for sore throat.   Respiratory:  Negative for cough, shortness of breath and wheezing.   Gastrointestinal:  Negative for diarrhea, nausea and vomiting.  Skin:  Positive for rash. Negative for color change.     Physical Exam Triage Vital Signs ED Triage Vitals  Encounter Vitals Group     BP 12/22/23 1024 138/72     Systolic BP Percentile --      Diastolic BP Percentile --      Pulse Rate 12/22/23 1024 63     Resp 12/22/23 1024 17     Temp 12/22/23 1024 98.1 F (36.7 C)     Temp Source 12/22/23 1024 Oral     SpO2 12/22/23 1024 98 %     Weight 12/22/23 1024 156 lb (70.8 kg)     Height --      Head Circumference --      Peak Flow --      Pain Score 12/22/23 1033 8     Pain Loc --      Pain Education --      Exclude from Growth Chart --    No data found.  Updated Vital Signs BP 138/72 (BP Location: Right Arm)   Pulse 63   Temp 98.1 F (36.7 C) (Oral)   Resp  17   Wt 156 lb (70.8 kg)   SpO2 98%   BMI 28.53 kg/m   Visual  Acuity Right Eye Distance:   Left Eye Distance:   Bilateral Distance:    Right Eye Near:   Left Eye Near:    Bilateral Near:     Physical Exam Vitals reviewed.  Constitutional:      Appearance: She is not ill-appearing.  HENT:     Head: Normocephalic and atraumatic.  Pulmonary:     Effort: Pulmonary effort is normal. No respiratory distress.  Skin:    General: Skin is warm and dry.     Findings: Rash (2 areas right forearm 2 cm and 3 cm with erythema and induration.  Several patchy areas on neck and back with dry scaling excoriated skin, multiple small excoriated papular lesions on ankles.) present.  Neurological:     Mental Status: She is alert.      UC Treatments / Results  Labs (all labs ordered are listed, but only abnormal results are displayed) Labs Reviewed - No data to display  EKG   Radiology No results found.  Procedures Procedures (including critical care time)  Medications Ordered in UC Medications - No data to display  Initial Impression / Assessment and Plan / UC Course  I have reviewed the triage vital signs and the nursing notes.  Pertinent labs & imaging results that were available during my care of the patient were reviewed by me and considered in my medical decision making (see chart for details).     58 year old with rash for 3 weeks, 2 areas look like local allergic reaction to insect bites, several patchy areas look like eczema.  Recommend she take Zyrtec once or twice daily will treat with topical antihistamine.  She already has a follow-up scheduled with her PCP Final Clinical Impressions(s) / UC Diagnoses   Final diagnoses:  Rash and nonspecific skin eruption  Allergic reaction, initial encounter     Discharge Instructions      Over-the-counter antihistamine such as Zyrtec, take once daily if that does not help increase to twice daily.   ED Prescriptions     Medication Sig Dispense Auth. Provider   triamcinolone cream  (KENALOG) 0.1 % Apply 1 Application topically 2 (two) times daily for 7 days. 30 g Meliton Rattan, [place]      PDMP not reviewed this encounter.   Meliton Rattan, [place] 12/22/23 1046

## 2023-12-22 NOTE — Discharge Instructions (Signed)
 Over-the-counter antihistamine such as Zyrtec, take once daily if that does not help increase to twice daily.

## 2023-12-22 NOTE — ED Triage Notes (Signed)
 Pt presents with complaints of red, itchy rash "all over body" x 3 weeks. Pt states she is severely allergic to Tide. Boyfriend and friend use Tide detergent and was around both recently. Pt has been taking OTC (Benadryl) oral pills & applying topical cream with no relief. Pt rates her overall discomfort an 8/10.

## 2023-12-30 ENCOUNTER — Ambulatory Visit (INDEPENDENT_AMBULATORY_CARE_PROVIDER_SITE_OTHER): Payer: Self-pay | Admitting: Family Medicine

## 2023-12-30 ENCOUNTER — Encounter: Payer: Self-pay | Admitting: Family Medicine

## 2023-12-30 VITALS — BP 142/80 | HR 55 | Temp 98.5°F | Ht 62.0 in | Wt 152.3 lb

## 2023-12-30 DIAGNOSIS — F339 Major depressive disorder, recurrent, unspecified: Secondary | ICD-10-CM

## 2023-12-30 DIAGNOSIS — I10 Essential (primary) hypertension: Secondary | ICD-10-CM | POA: Insufficient documentation

## 2023-12-30 MED ORDER — LAMOTRIGINE 25 MG PO TABS
25.0000 mg | ORAL_TABLET | Freq: Two times a day (BID) | ORAL | 1 refills | Status: DC
Start: 2023-12-30 — End: 2024-03-29

## 2023-12-30 NOTE — Assessment & Plan Note (Signed)
 Will refill the lamotrigine for her today, follow up in 2 months at her regular follow up visit.

## 2023-12-30 NOTE — Assessment & Plan Note (Signed)
 On lisinopril 20 mg daily, will continue this for now, recheck at her next visit in May, if it continues to be elevated then we will need to adjust her medication

## 2023-12-30 NOTE — Patient Instructions (Signed)
 Chromium can help improve blood sugars Berberine 500- 600 mg 30 minutes before each meal-- [employer] or [employer]

## 2023-12-30 NOTE — Progress Notes (Signed)
 Established Patient Office Visit  Subjective   Patient ID: Lauren [name], female    DOB: April 16, 1966  Age: 58 y.o. MRN: [id number]  Chief Complaint  Patient presents with   Medical Management of Chronic Issues    Pt is here for follow up today. She repotrs she was seen in the UC for a rash on her left arm and chest as well as back. States that she is allergic to T[employer] detergent and her boyfriend uses this detergent. States that she has been using benadryl cream and triamcinolone cream with some improvement.   Bp is elevated today, states that she drank a lot of coffee this morning and she also smoked prior to her visit,  Will recheck at next visit.   Depression -- pt is requesting refills of the lamotrigine. States that she is no longer seeing the therapist who was prescribing it for her. States that it stabilizes her mood and she reports that it works very well for her, denies any side effects to the medication.     Current Outpatient Medications  Medication Instructions   albuterol (PROAIR HFA) 108 (90 Base) MCG/ACT inhaler 1-2 puffs, Inhalation, Every 6 hours PRN   aspirin EC 81 mg, Daily   atorvastatin (LIPITOR) 20 mg, Oral, Daily   busPIRone (BUSPAR) 15 mg, 2 times daily   fluticasone (FLONASE) 50 MCG/ACT nasal spray 1 spray, Each Nare, Daily   lamoTRIgine (LAMICTAL) 25 mg, Oral, 2 times daily   lisinopril (ZESTRIL) 20 MG tablet TAKE 1 TABLET (20 MG TOTAL) BY MOUTH DAILY.   metoprolol tartrate (LOPRESSOR) 25 MG tablet TAKE 1.5 TABLETS (37.5 MG TOTAL) BY MOUTH TWICE A DAY   Misc. Devices MISC Please provide patient with insurance approved supplies for her CPAP: tubing, chin strap and supplies and filters.   Multiple Vitamin (MULTIVITAMIN) capsule 1 capsule, Daily   traZODone (DESYREL) 50-100 mg, Oral, Daily at bedtime   triamcinolone cream (KENALOG) 0.1 % 1 Application, 2 times daily   venlafaxine XR (EFFEXOR-XR) 150 mg, Oral, Every morning      Review of Systems  All  other systems reviewed and are negative.     Objective:     BP (!) 142/80   Pulse (!) 55   Temp 98.5 F (36.9 C) (Oral)   Ht 5\' 2"  (1.575 m)   Wt 152 lb 4.8 oz (69.1 kg)   SpO2 99%   BMI 27.86 kg/m  BP Readings from Last 3 Encounters:  12/30/23 (!) 142/80  12/22/23 138/72  09/15/23 132/80      Physical Exam Vitals reviewed.  Constitutional:      Appearance: Normal appearance. She is normal weight.  Cardiovascular:     Rate and Rhythm: Normal rate and regular rhythm.     Heart sounds: Normal heart sounds. No murmur heard. Pulmonary:     Effort: Pulmonary effort is normal.     Breath sounds: Normal breath sounds. No wheezing.  Skin:    Findings: Rash (raised patches on the left arm and chest) present.  Neurological:     Mental Status: She is alert and oriented to person, place, and time. Mental status is at baseline.  Psychiatric:        Mood and Affect: Mood normal.        Behavior: Behavior normal.      No results found for any visits on 12/30/23.  Last lipids Lab Results  Component Value Date   CHOL 137 09/15/2023   HDL 56.70 09/15/2023  LDLCALC 62 09/15/2023   TRIG 91.0 09/15/2023   CHOLHDL 2 09/15/2023      The 16-XWRU ASCVD risk score (Arnett DK, et al., 2019) is: 10.5%    Assessment & Plan:  Recurrent major depressive disorder, remission status unspecified (HCC) Assessment & Plan: Will refill the lamotrigine for her today, follow up in 2 months at her regular follow up visit.   Orders: -     lamoTRIgine; Take 1 tablet (25 mg total) by mouth 2 (two) times daily.  Dispense: 180 tablet; Refill: 1  Primary hypertension Assessment & Plan: On lisinopril 20 mg daily, will continue this for now, recheck at her next visit in May, if it continues to be elevated then we will need to adjust her medication      No follow-ups on file.    Karie Georges, MD

## 2024-01-25 NOTE — Progress Notes (Unsigned)
 Cardiology Office Note:  .   Date:  01/26/2024  ID:  Lauren [name], DOB [birth year], MRN [id number] PCP: Karie Georges, MD   HeartCare Providers Cardiologist:  Reatha Harps, MD Electrophysiologist:  Lanier Prude, MD {   History of Present Illness: .    Chief Complaint  Patient presents with   Follow-up    Lauren [name] is a 58 y.o. female with history of CAD, SVT, HTN, HLD who presents for follow-up.    History of Present Illness   Lauren [name] is a 58 year old female with nonobstructive CAD, SVT, hypertension, and hyperlipidemia who presents for follow-up.  She has been stable since her ablation procedure for SVT, experiencing no episodes of palpitations except for one instance unrelated to her previous condition. She has no chest pain or dyspnea. Her cholesterol levels were excellent in November, and there are no blockages in her coronary arteries.  Hypertension is managed with lisinopril 20 mg once daily and metoprolol tartrate 37.5 mg twice daily, resulting in well-controlled blood pressure.  Hyperlipidemia is effectively managed with atorvastatin, with her most recent LDL being 62 mg/dL.  She continues to smoke, although she is attempting to reduce her intake. She currently smokes about half a pack every two and a half days, noting reduced smoking while working. She has been smoking since age 67.  She experiences occasional nocturnal leg cramps, which she attributes to potassium or fluid issues. A colleague suggested increasing vitamin D3 intake, but she has not pursued this advice. There are no blood flow issues in her legs.          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 Tobacco abuse -1/2 ppd    ROS: All other ROS reviewed and negative. Pertinent positives noted in the HPI.     Studies Reviewed: Marland Kitchen         CCTA 05-16-2022 IMPRESSION: 1. Coronary calcium score of 1.1. This was 78th percentile for age-, sex, and race-matched controls. Plaque volume 18 mm3.   2. Normal coronary origin with right dominance.   3. Minimal calcified plaque (<25%) in the mid LAD.   4. Minimal non-calcified plaque (<25%) in the LCX. Physical Exam:   VS:  BP 110/70   Pulse 78   Ht 5\' 2"  (1.575 m)   Wt 152 lb (68.9 kg)   SpO2 96%   BMI 27.80 kg/m    Wt Readings from Last 3 Encounters:  01/26/24 152 lb (68.9 kg)  12/30/23 152 lb 4.8 oz (69.1 kg)  12/22/23 156 lb (70.8 kg)    GEN: Well nourished, well developed in no acute distress NECK: No JVD; No carotid bruits CARDIAC: RRR, no murmurs, rubs, gallops RESPIRATORY:  Clear to auscultation without rales, wheezing or rhonchi  ABDOMEN: Soft, non-tender, non-distended EXTREMITIES:  No edema; No deformity  ASSESSMENT AND PLAN: .   Assessment and Plan    Nonobstructive Coronary Artery Disease (CAD) Nonobstructive CAD with minimal plaque burden. Coronary calcium score 1.1. Cholesterol levels well-controlled, LDL at 62. Aspirin not required. - Continue atorvastatin (Lipitor) 20 mg daily. - Discontinue aspirin.  Supraventricular Tachycardia (SVT) No recent SVT episodes post-ablation. Ablation appears successful.  Hypertension Blood pressure well-controlled at 110/70 mmHg with current regimen. - Continue lisinopril 20 mg once daily. - Continue metoprolol tartrate 37.5 mg twice daily.  Hyperlipidemia Cholesterol levels well-controlled  with atorvastatin. LDL at goal. - Continue atorvastatin (Lipitor) 20 mg daily.  Tobacco Use Disorder Continues to smoke half a pack per day. Smoking cessation counseling provided. - 3 min smoking cessation counseling provided in office today. - Encourage gradual reduction in smoking with a goal of complete cessation.  Leg Cramping Intermittent nocturnal leg cramping, likely related to potassium or fluid imbalance. -  Advise increasing dietary potassium intake, such as consuming bananas. - 2+ LE pulses on exam today; not a vascular issue              Follow-up: Return in about 1 year (around 01/25/2025).   Signed, Lenna Gilford. Flora Lipps, MD, [facility]  [facility]  [address], [address] [place], [place] [address] [phone]  3:39 PM

## 2024-01-26 ENCOUNTER — Encounter: Payer: Self-pay | Admitting: Cardiovascular Disease

## 2024-01-26 ENCOUNTER — Ambulatory Visit: Payer: Self-pay | Attending: Cardiovascular Disease | Admitting: Cardiovascular Disease

## 2024-01-26 VITALS — BP 110/70 | HR 78 | Ht 62.0 in | Wt 152.0 lb

## 2024-01-26 DIAGNOSIS — R002 Palpitations: Secondary | ICD-10-CM | POA: Diagnosis not present

## 2024-01-26 DIAGNOSIS — E782 Mixed hyperlipidemia: Secondary | ICD-10-CM | POA: Diagnosis not present

## 2024-01-26 DIAGNOSIS — I471 Supraventricular tachycardia, unspecified: Secondary | ICD-10-CM

## 2024-01-26 DIAGNOSIS — F17200 Nicotine dependence, unspecified, uncomplicated: Secondary | ICD-10-CM

## 2024-01-26 DIAGNOSIS — I2510 Atherosclerotic heart disease of native coronary artery without angina pectoris: Secondary | ICD-10-CM | POA: Diagnosis not present

## 2024-01-26 DIAGNOSIS — F17210 Nicotine dependence, cigarettes, uncomplicated: Secondary | ICD-10-CM | POA: Diagnosis not present

## 2024-01-26 DIAGNOSIS — Z720 Tobacco use: Secondary | ICD-10-CM

## 2024-01-26 DIAGNOSIS — I10 Essential (primary) hypertension: Secondary | ICD-10-CM

## 2024-01-26 NOTE — Patient Instructions (Signed)
 Medication Instructions:  - STOP ASPIRIN    *If you need a refill on your cardiac medications before your next appointment, please call your pharmacy*   Lab Work: NONE    If you have labs (blood work) drawn today and your tests are completely normal, you will receive your results only by: MyChart Message (if you have MyChart) OR A paper copy in the mail If you have any lab test that is abnormal or we need to change your treatment, we will call you to review the results.   Testing/Procedures: NONE    Follow-Up: At [facility], you and your health needs are our priority.  As part of our continuing mission to provide you with exceptional heart care, we have created designated Provider Care Teams.  These Care Teams include your primary Cardiologist (physician) and Advanced Practice Providers (APPs -  Physician Assistants and Nurse Practitioners) who all work together to provide you with the care you need, when you need it.  We recommend signing up for the patient portal called "MyChart".  Sign up information is provided on this After Visit Summary.  MyChart is used to connect with patients for Virtual Visits (Telemedicine).  Patients are able to view lab/test results, encounter notes, upcoming appointments, etc.  Non-urgent messages can be sent to your provider as well.   To learn more about what you can do with MyChart, go to [email or web address].    Your next appointment:   1 year(s)  The format for your next appointment:   In Person  Provider:   Edd Fabian, FNP, Micah Flesher, PA-C, Marjie Skiff, PA-C, Robet Leu, PA-C, Juanda Crumble, PA-C, Joni Reining, DNP, ANP, Azalee Course, PA-C, Bernadene Person, NP, or Reather Littler, NP       Other Instructions

## 2024-02-05 ENCOUNTER — Encounter: Payer: Self-pay | Admitting: Family Medicine

## 2024-03-02 ENCOUNTER — Ambulatory Visit

## 2024-03-09 ENCOUNTER — Ambulatory Visit

## 2024-03-16 ENCOUNTER — Ambulatory Visit (INDEPENDENT_AMBULATORY_CARE_PROVIDER_SITE_OTHER): Payer: 59 | Admitting: Family Medicine

## 2024-03-16 ENCOUNTER — Encounter: Payer: Self-pay | Admitting: Family Medicine

## 2024-03-16 VITALS — BP 130/80 | HR 50 | Temp 98.6°F | Ht 62.0 in | Wt 154.6 lb

## 2024-03-16 DIAGNOSIS — R7303 Prediabetes: Secondary | ICD-10-CM | POA: Diagnosis not present

## 2024-03-16 DIAGNOSIS — L299 Pruritus, unspecified: Secondary | ICD-10-CM

## 2024-03-16 DIAGNOSIS — I10 Essential (primary) hypertension: Secondary | ICD-10-CM | POA: Diagnosis not present

## 2024-03-16 DIAGNOSIS — Z1231 Encounter for screening mammogram for malignant neoplasm of breast: Secondary | ICD-10-CM

## 2024-03-16 DIAGNOSIS — Z23 Encounter for immunization: Secondary | ICD-10-CM

## 2024-03-16 LAB — POCT GLYCOSYLATED HEMOGLOBIN (HGB A1C): Hemoglobin A1C: 5.9 % — AB (ref 4.0–5.6)

## 2024-03-16 MED ORDER — LISINOPRIL 20 MG PO TABS: ORAL_TABLET | Freq: Every day | ORAL | 1 refills | Status: DC

## 2024-03-16 NOTE — Progress Notes (Unsigned)
 Established Patient Office Visit  Subjective   Patient ID: Lauren [name], female    DOB: [birth year]  Age: 58 y.o. MRN: [id number]  Chief Complaint  Patient presents with   Medical Management of Chronic Issues    Pt is here for 6 month follow up on chronic medical conditions.   HTN -- BP in office performed and is well controlled. She  reports no side effects to the medications, no chest pain, SOB, dizziness or headaches. She has a BP cuff at home and is checking BP regularly, reports they are in the normal range.   Prediabetes- pt reports she is reducing unnecessary sugar in her diet, stopped drinking sodas and drinks. A1C today is 5.9 which is better than previous.     Current Outpatient Medications  Medication Instructions   albuterol  (PROAIR  HFA) 108 (90 Base) MCG/ACT inhaler 1-2 puffs, Inhalation, Every 6 hours PRN   aspirin  EC 81 mg, Daily   atorvastatin  (LIPITOR) 20 mg, Oral, Daily   busPIRone  (BUSPAR ) 15 mg, 2 times daily   fluticasone  (FLONASE ) 50 MCG/ACT nasal spray 1 spray, Each Nare, Daily   lamoTRIgine  (LAMICTAL ) 25 mg, Oral, 2 times daily   lisinopril  (ZESTRIL ) 20 MG tablet TAKE 1 TABLET (20 MG TOTAL) BY MOUTH DAILY.   metoprolol  tartrate (LOPRESSOR ) 25 MG tablet TAKE 1.5 TABLETS (37.5 MG TOTAL) BY MOUTH TWICE A DAY   Misc. Devices MISC Please provide patient with insurance approved supplies for her CPAP: tubing, chin strap and supplies and filters.   Multiple Vitamin (MULTIVITAMIN) capsule 1 capsule, Daily   traZODone  (DESYREL ) 50-100 mg, Oral, Daily at bedtime   triamcinolone  cream (KENALOG ) 0.1 % 1 Application, 2 times daily   venlafaxine  XR (EFFEXOR -XR) 150 mg, Oral, Every morning    Patient Active Problem List   Diagnosis Date Noted   Hypertension    Chronic cough 01/27/2023   Prediabetes 01/27/2023   Obesity (BMI 30-39.9) 06/21/2022   Paroxysmal SVT (supraventricular tachycardia) (HCC) 06/21/2022   Recurrent major depressive disorder, remission  status unspecified (HCC) 12/17/2021   Left lumbar radiculitis 04/19/2021   Radiculopathy of cervical spine 08/25/2018   Pain in right hand 07/28/2018   Trigger finger, right ring finger 07/28/2018   Mixed hyperlipidemia 06/19/2017   Gastroesophageal reflux disease 04/15/2017   OSA (obstructive sleep apnea) 04/15/2017   Depression 11/28/2016      Review of Systems  All other systems reviewed and are negative.     Objective:     BP 130/80   Pulse (!) 50   Temp 98.6 F (37 C) (Oral)   Ht 5\' 2"  (1.575 m)   Wt 154 lb 9.6 oz (70.1 kg)   SpO2 97%   BMI 28.28 kg/m    Physical Exam Vitals reviewed.  Constitutional:      Appearance: Normal appearance. She is well-groomed and normal weight.  Eyes:     Conjunctiva/sclera: Conjunctivae normal.  Neck:     Thyroid : No thyromegaly.  Cardiovascular:     Rate and Rhythm: Normal rate and regular rhythm.     Pulses: Normal pulses.     Heart sounds: S1 normal and S2 normal.  Pulmonary:     Effort: Pulmonary effort is normal.     Breath sounds: Normal breath sounds and air entry.  Abdominal:     General: Bowel sounds are normal.  Musculoskeletal:     Right lower leg: No edema.     Left lower leg: No edema.  Neurological:  Mental Status: She is alert and oriented to person, place, and time. Mental status is at baseline.     Gait: Gait is intact.  Psychiatric:        Mood and Affect: Mood and affect normal.        Speech: Speech normal.        Behavior: Behavior normal.        Judgment: Judgment normal.      Results for orders placed or performed in visit on 03/16/24  POC HgB A1c  Result Value Ref Range   Hemoglobin A1C 5.9 (A) 4.0 - 5.6 %   HbA1c POC (<> result, manual entry)     HbA1c, POC (prediabetic range)     HbA1c, POC (controlled diabetic range)        The 10-year ASCVD risk score (Arnett DK, et al., 2019) is: 8.5%    Assessment & Plan:  Prediabetes Assessment & Plan: A1C is stable, she is doing well  with the dietary changes she is making and I encouraged her to continue her efforts, will continue to monitor every 6 months  Orders: -     POCT glycosylated hemoglobin (Hb A1C) -     Collection capillary blood specimen  Immunization due -     Varicella-zoster vaccine IM  Breast cancer screening by mammogram -     3D Screening Mammogram, Left and Right; Future  Pruritus of skin  Primary hypertension Assessment & Plan: On lisinopril  20 mg daily, will continue this. BP today is controlled.   Orders: -     Lisinopril ; TAKE 1 TABLET (20 MG TOTAL) BY MOUTH DAILY.  Dispense: 90 tablet; Refill: 1    Reviewed health maintenance and provided counseling on the shingrix vaccination.  Return in about 6 months (around 09/16/2024) for annual physical exam.    Aida House, MD

## 2024-03-17 NOTE — Assessment & Plan Note (Signed)
 A1C is stable, she is doing well with the dietary changes she is making and I encouraged her to continue her efforts, will continue to monitor every 6 months

## 2024-03-17 NOTE — Assessment & Plan Note (Signed)
 On lisinopril  20 mg daily, will continue this. BP today is controlled.

## 2024-03-20 ENCOUNTER — Encounter: Payer: Self-pay | Admitting: Family Medicine

## 2024-03-28 ENCOUNTER — Other Ambulatory Visit: Payer: Self-pay | Admitting: Family Medicine

## 2024-03-28 DIAGNOSIS — F339 Major depressive disorder, recurrent, unspecified: Secondary | ICD-10-CM

## 2024-03-29 ENCOUNTER — Encounter: Payer: Self-pay | Admitting: Family Medicine

## 2024-04-22 ENCOUNTER — Encounter: Payer: Self-pay | Admitting: Family Medicine

## 2024-04-24 ENCOUNTER — Other Ambulatory Visit: Payer: Self-pay | Admitting: Family Medicine

## 2024-04-24 DIAGNOSIS — F339 Major depressive disorder, recurrent, unspecified: Secondary | ICD-10-CM

## 2024-04-26 ENCOUNTER — Other Ambulatory Visit: Payer: Self-pay

## 2024-04-26 NOTE — Telephone Encounter (Signed)
 [facility] to refill

## 2024-05-22 ENCOUNTER — Other Ambulatory Visit: Payer: Self-pay | Admitting: Cardiovascular Disease

## 2024-07-12 ENCOUNTER — Other Ambulatory Visit: Payer: Self-pay | Admitting: Family Medicine

## 2024-07-13 ENCOUNTER — Other Ambulatory Visit: Payer: Self-pay | Admitting: Nurse Practitioner

## 2024-09-22 ENCOUNTER — Ambulatory Visit: Admitting: Family Medicine

## 2024-09-22 VITALS — BP 148/90 | HR 53 | Temp 99.0°F | Ht 62.0 in | Wt 155.1 lb

## 2024-09-22 DIAGNOSIS — I10 Essential (primary) hypertension: Secondary | ICD-10-CM

## 2024-09-22 DIAGNOSIS — E782 Mixed hyperlipidemia: Secondary | ICD-10-CM | POA: Diagnosis not present

## 2024-09-22 DIAGNOSIS — R7303 Prediabetes: Secondary | ICD-10-CM

## 2024-09-22 DIAGNOSIS — F339 Major depressive disorder, recurrent, unspecified: Secondary | ICD-10-CM

## 2024-09-22 DIAGNOSIS — Z0000 Encounter for general adult medical examination without abnormal findings: Secondary | ICD-10-CM

## 2024-09-22 DIAGNOSIS — F419 Anxiety disorder, unspecified: Secondary | ICD-10-CM

## 2024-09-22 LAB — COMPREHENSIVE METABOLIC PANEL WITH GFR
ALT: 23 U/L (ref 0–35)
AST: 23 U/L (ref 0–37)
Albumin: 4.2 g/dL (ref 3.5–5.2)
Alkaline Phosphatase: 64 U/L (ref 39–117)
BUN: 12 mg/dL (ref 6–23)
CO2: 28 meq/L (ref 19–32)
Calcium: 9.2 mg/dL (ref 8.4–10.5)
Chloride: 106 meq/L (ref 96–112)
Creatinine, Ser: 0.73 mg/dL (ref 0.40–1.20)
GFR: 90.53 mL/min (ref >60.00)
Glucose, Bld: 99 mg/dL (ref 70–99)
Potassium: 4 meq/L (ref 3.5–5.1)
Sodium: 140 meq/L (ref 135–145)
Total Bilirubin: 0.4 mg/dL (ref 0.2–1.2)
Total Protein: 6.9 g/dL (ref 6.0–8.3)

## 2024-09-22 LAB — CBC WITH DIFFERENTIAL/PLATELET
Basophils Absolute: 0.1 K/uL (ref 0.0–0.1)
Basophils Relative: 0.8 % (ref 0.0–3.0)
Eosinophils Absolute: 0.1 K/uL (ref 0.0–0.7)
Eosinophils Relative: 1.5 % (ref 0.0–5.0)
HCT: 39.8 % (ref 36.0–46.0)
Hemoglobin: 13.2 g/dL (ref 12.0–15.0)
Lymphocytes Relative: 49.6 % — ABNORMAL HIGH (ref 12.0–46.0)
Lymphs Abs: 3.3 K/uL (ref 0.7–4.0)
MCHC: 33.1 g/dL (ref 30.0–36.0)
MCV: 92.6 fl (ref 78.0–100.0)
Monocytes Absolute: 0.4 K/uL (ref 0.1–1.0)
Monocytes Relative: 5.6 % (ref 3.0–12.0)
Neutro Abs: 2.8 K/uL (ref 1.4–7.7)
Neutrophils Relative %: 42.5 % — ABNORMAL LOW (ref 43.0–77.0)
Platelets: 250 K/uL (ref 150.0–400.0)
RBC: 4.3 Mil/uL (ref 3.87–5.11)
RDW: 14.1 % (ref 11.5–15.5)
WBC: 6.7 K/uL (ref 4.0–10.5)

## 2024-09-22 LAB — LIPID PANEL
Cholesterol: 139 mg/dL (ref 0–200)
HDL: 60.6 mg/dL (ref >39.00)
LDL Cholesterol: 66 mg/dL (ref 0–99)
NonHDL: 78.05
Total CHOL/HDL Ratio: 2
Triglycerides: 58 mg/dL (ref 0.0–149.0)
VLDL: 11.6 mg/dL (ref 0.0–40.0)

## 2024-09-22 LAB — HEMOGLOBIN A1C: Hgb A1c MFr Bld: 6.1 % (ref 4.6–6.5)

## 2024-09-22 MED ORDER — BUSPIRONE HCL 15 MG PO TABS: 15.0000 mg | ORAL_TABLET | Freq: Two times a day (BID) | ORAL | 1 refills | Status: AC

## 2024-09-22 MED ORDER — LISINOPRIL 20 MG PO TABS: ORAL_TABLET | Freq: Every day | ORAL | 1 refills | Status: AC

## 2024-09-22 MED ORDER — LAMOTRIGINE 25 MG PO TABS: 25.0000 mg | ORAL_TABLET | Freq: Two times a day (BID) | ORAL | 1 refills | Status: AC

## 2024-09-22 MED ORDER — VENLAFAXINE HCL ER 150 MG PO CP24: 150.0000 mg | ORAL_CAPSULE | Freq: Every day | ORAL | 5 refills | Status: AC

## 2024-09-22 NOTE — Patient Instructions (Signed)

## 2024-09-22 NOTE — Progress Notes (Signed)
 Complete physical exam  Patient: Lauren [name]   DOB: 1966/01/29   57 y.o. Female  MRN: [id number]  Subjective:    Chief Complaint  Patient presents with   Annual Exam    Lauren [name] is a 58 y.o. female who presents today for a complete physical exam. She reports consuming a general diet. Eats fruits and veggies often, tries to eat them every day, eats protein often, not really every day. Doesn't really eat dairy, occasionally eats cheese. The patient has a physically strenuous job, but has no regular exercise apart from work.  She generally feels well. She reports sleeping fairly well, feels rested but often wakes up in the middle of the night randomly. She does not have additional problems to discuss today.    Most recent fall risk assessment:    04/30/2023   10:39 AM  Fall Risk   Falls in the past year? 0  Number falls in past yr: 0  Injury with Fall? 0   Risk for fall due to : No Fall Risks  Follow up Falls evaluation completed     Data saved with a previous flowsheet row definition     Most recent depression screenings:    03/16/2024    8:59 AM 12/30/2023   11:13 AM  PHQ 2/9 Scores  PHQ - 2 Score 0 0  PHQ- 9 Score 0  1      Data saved with a previous flowsheet row definition    Vision:saw them last year, wears glasses for astigmatism and Dental: No current dental problems and Last dental visit: 1 year ago, has trouble affording dental insurance  Patient Active Problem List   Diagnosis Date Noted   Hypertension    Chronic cough 01/27/2023   Prediabetes 01/27/2023   Obesity (BMI 30-39.9) 06/21/2022   Paroxysmal SVT (supraventricular tachycardia) 06/21/2022   Recurrent major depressive disorder, remission status unspecified 12/17/2021   Left lumbar radiculitis 04/19/2021   Radiculopathy of cervical spine 08/25/2018   Pain in right hand 07/28/2018   Trigger finger, right ring finger 07/28/2018   Mixed hyperlipidemia 06/19/2017   Gastroesophageal reflux  disease 04/15/2017   OSA (obstructive sleep apnea) 04/15/2017   Depression 11/28/2016      Patient Care Team: Ozell Heron HERO, MD as PCP - General (Family Medicine) Cindie Ole DASEN, MD as PCP - Electrophysiology (Cardiology) O'Neal, Darryle Ned, MD as PCP - Cardiology (Cardiology)   Outpatient Medications Prior to Visit  Medication Sig   albuterol  (PROAIR  HFA) 108 (90 Base) MCG/ACT inhaler Inhale 1-2 puffs into the lungs every 6 (six) hours as needed for wheezing or shortness of breath.   aspirin  EC 81 MG tablet Take 81 mg by mouth daily. Swallow whole.   atorvastatin  (LIPITOR) 20 MG tablet TAKE 1 TABLET BY MOUTH EVERY DAY   fluticasone  (FLONASE ) 50 MCG/ACT nasal spray Place 1 spray into both nostrils daily.   metoprolol  tartrate (LOPRESSOR ) 25 MG tablet TAKE 1.5 TABLETS (37.5 MG TOTAL) BY MOUTH TWICE A DAY   Misc. Devices MISC Please provide patient with insurance approved supplies for her CPAP: tubing, chin strap and supplies and filters.   Multiple Vitamin (MULTIVITAMIN) capsule Take 1 capsule by mouth daily.   triamcinolone  cream (KENALOG ) 0.1 % Apply 1 Application topically 2 (two) times daily.   [DISCONTINUED] busPIRone  (BUSPAR ) 15 MG tablet Take 15 mg by mouth 2 (two) times daily.   [DISCONTINUED] lamoTRIgine  (LAMICTAL ) 25 MG tablet TAKE 1 TABLET BY MOUTH TWICE A DAY   [  DISCONTINUED] lisinopril  (ZESTRIL ) 20 MG tablet TAKE 1 TABLET (20 MG TOTAL) BY MOUTH DAILY.   [DISCONTINUED] traZODone  (DESYREL ) 100 MG tablet Take 0.5-1 tablets (50-100 mg total) by mouth at bedtime.   [DISCONTINUED] venlafaxine  XR (EFFEXOR -XR) 150 MG 24 hr capsule TAKE 1 CAPSULE BY MOUTH EVERY DAY   No facility-administered medications prior to visit.    Review of Systems  HENT:  Negative for hearing loss.   Eyes:  Negative for blurred vision.  Respiratory:  Negative for shortness of breath.   Cardiovascular:  Negative for chest pain.  Gastrointestinal: Negative.   Genitourinary: Negative.    Musculoskeletal:  Negative for back pain.  Neurological:  Negative for headaches.  Psychiatric/Behavioral:  Negative for depression.        Objective:     BP (!) 148/90   Pulse (!) 53   Temp 99 F (37.2 C) (Oral)   Ht 5' 2 (1.575 m)   Wt 155 lb 1.6 oz (70.4 kg)   SpO2 98%   BMI 28.37 kg/m    Physical Exam Vitals reviewed.  Constitutional:      Appearance: Normal appearance. She is well-groomed and normal weight.  HENT:     Right Ear: Tympanic membrane and ear canal normal.     Left Ear: Tympanic membrane and ear canal normal.     Mouth/Throat:     Mouth: Mucous membranes are moist.     Pharynx: No posterior oropharyngeal erythema.  Eyes:     Conjunctiva/sclera: Conjunctivae normal.  Neck:     Thyroid : No thyromegaly.  Cardiovascular:     Rate and Rhythm: Normal rate and regular rhythm.     Pulses: Normal pulses.     Heart sounds: S1 normal and S2 normal.  Pulmonary:     Effort: Pulmonary effort is normal.     Breath sounds: Normal breath sounds and air entry.  Abdominal:     General: Abdomen is flat. Bowel sounds are normal.     Palpations: Abdomen is soft.  Musculoskeletal:     Right lower leg: No edema.     Left lower leg: No edema.  Lymphadenopathy:     Cervical: No cervical adenopathy.  Neurological:     Mental Status: She is alert and oriented to person, place, and time. Mental status is at baseline.     Gait: Gait is intact.  Psychiatric:        Mood and Affect: Mood and affect normal.        Speech: Speech normal.        Behavior: Behavior normal.        Judgment: Judgment normal.      No results found for any visits on 09/22/24.     Assessment & Plan:    Routine Health Maintenance and Physical Exam  Immunization History  Administered Date(s) Administered   Tdap 06/14/2019   Zoster Recombinant(Shingrix ) 03/16/2024    Health Maintenance  Topic Date Due   Pneumococcal Vaccine: 50+ Years (1 of 2 - PCV) Never done   Hepatitis B  Vaccines 19-59 Average Risk (1 of 3 - 19+ 3-dose series) Never done   Mammogram  02/25/2025   Colonoscopy  05/03/2026   DTaP/Tdap/Td (2 - Td or Tdap) 06/13/2029   Hepatitis C Screening  Completed   HIV Screening  Completed   HPV VACCINES  Aged Out   Meningococcal B Vaccine  Aged Out   Influenza Vaccine  Discontinued   COVID-19 Vaccine  Discontinued   Zoster Vaccines- Shingrix   Discontinued    Discussed health benefits of physical activity, and encouraged her to engage in regular exercise appropriate for her age and condition.  Mixed hyperlipidemia -     Lipid panel; Future  Prediabetes -     Hemoglobin A1c; Future  Primary hypertension -     Comprehensive metabolic panel with GFR; Future -     Lisinopril ; TAKE 1 TABLET (20 MG TOTAL) BY MOUTH DAILY.  Dispense: 90 tablet; Refill: 1  Routine general medical examination at a health care facility -     CBC with Differential/Platelet; Future  Recurrent major depressive disorder, remission status unspecified -     Venlafaxine  HCl ER; Take 1 capsule (150 mg total) by mouth daily.  Dispense: 30 capsule; Refill: 5 -     lamoTRIgine ; Take 1 tablet (25 mg total) by mouth 2 (two) times daily.  Dispense: 180 tablet; Refill: 1  Anxiety -     busPIRone  HCl; Take 1 tablet (15 mg total) by mouth 2 (two) times daily.  Dispense: 180 tablet; Refill: 1  General physical exam findings are normal today. I reviewed the patient's preventative testing, immunizations, and lifestyle habits. I made appropriate recommendations and placed orders for the appropriate tests and/or vaccinations. I counseled the patient on the CDC's recommendations for healthy exercise and diet. I counseled the patient on healthy sleep habits and stress management. Handouts to reinforce the counseling were given at the conclusion of the visit.    Return in 6 months (on 03/23/2025) for HTN.     Heron CHRISTELLA Sharper, MD

## 2024-09-24 ENCOUNTER — Ambulatory Visit: Payer: Self-pay | Admitting: Family Medicine

## 2025-03-23 ENCOUNTER — Ambulatory Visit: Admitting: Family Medicine
